# Patient Record
Sex: Female | Born: 1960 | Race: White | Hispanic: No | Smoking: Former smoker
Health system: Southern US, Community
[De-identification: ages and names within clinical notes are randomized; demographics above are authoritative.]

## PROBLEM LIST (undated history)

## (undated) DIAGNOSIS — K219 Gastro-esophageal reflux disease without esophagitis: Secondary | ICD-10-CM

## (undated) DIAGNOSIS — R0600 Dyspnea, unspecified: Secondary | ICD-10-CM

## (undated) DIAGNOSIS — R0601 Orthopnea: Secondary | ICD-10-CM

## (undated) DIAGNOSIS — J449 Chronic obstructive pulmonary disease, unspecified: Secondary | ICD-10-CM

## (undated) DIAGNOSIS — R062 Wheezing: Secondary | ICD-10-CM

## (undated) DIAGNOSIS — J45909 Unspecified asthma, uncomplicated: Secondary | ICD-10-CM

## (undated) DIAGNOSIS — J189 Pneumonia, unspecified organism: Secondary | ICD-10-CM

## (undated) HISTORY — DX: Chronic obstructive pulmonary disease, unspecified: J44.9

## (undated) HISTORY — DX: Pneumonia, unspecified organism: J18.9

## (undated) HISTORY — PX: OTHER SURGICAL HISTORY: SHX169

## (undated) HISTORY — PX: FOOT SURGERY: SHX648

## (undated) HISTORY — PX: EYE SURGERY: SHX253

## (undated) HISTORY — PX: THROAT SURGERY: SHX803

---

## 1987-04-17 HISTORY — PX: ABDOMINAL HYSTERECTOMY: SHX81

## 1987-04-17 HISTORY — PX: VAGINAL HYSTERECTOMY: SUR661

## 2009-03-06 ENCOUNTER — Emergency Department: Payer: Self-pay

## 2009-09-24 ENCOUNTER — Inpatient Hospital Stay: Payer: Self-pay | Admitting: Internal Medicine

## 2010-03-13 ENCOUNTER — Emergency Department: Payer: Self-pay | Admitting: Emergency Medicine

## 2010-05-26 ENCOUNTER — Emergency Department: Payer: Self-pay | Admitting: Emergency Medicine

## 2010-07-13 DIAGNOSIS — J309 Allergic rhinitis, unspecified: Secondary | ICD-10-CM | POA: Insufficient documentation

## 2010-07-13 DIAGNOSIS — Z91018 Allergy to other foods: Secondary | ICD-10-CM | POA: Insufficient documentation

## 2012-03-20 ENCOUNTER — Inpatient Hospital Stay: Payer: Self-pay | Admitting: Family Medicine

## 2012-03-20 LAB — COMPREHENSIVE METABOLIC PANEL
Albumin: 4.1 g/dL (ref 3.4–5.0)
Alkaline Phosphatase: 94 U/L (ref 50–136)
Anion Gap: 6 — ABNORMAL LOW (ref 7–16)
Bilirubin,Total: 0.6 mg/dL (ref 0.2–1.0)
Calcium, Total: 9.5 mg/dL (ref 8.5–10.1)
Co2: 25 mmol/L (ref 21–32)
Creatinine: 0.89 mg/dL (ref 0.60–1.30)
Osmolality: 268 (ref 275–301)
Potassium: 4.5 mmol/L (ref 3.5–5.1)
SGPT (ALT): 24 U/L (ref 12–78)
Sodium: 135 mmol/L — ABNORMAL LOW (ref 136–145)
Total Protein: 7.6 g/dL (ref 6.4–8.2)

## 2012-03-20 LAB — CBC WITH DIFFERENTIAL/PLATELET
Basophil #: 0.1 10*3/uL (ref 0.0–0.1)
Eosinophil #: 1.1 10*3/uL — ABNORMAL HIGH (ref 0.0–0.7)
HGB: 15 g/dL (ref 12.0–16.0)
Lymphocyte #: 3.5 10*3/uL (ref 1.0–3.6)
Lymphocyte %: 30.4 %
MCH: 30 pg (ref 26.0–34.0)
Monocyte %: 6.6 %
Neutrophil #: 6.1 10*3/uL (ref 1.4–6.5)
Neutrophil %: 52.7 %
Platelet: 290 10*3/uL (ref 150–440)
RBC: 5.02 10*6/uL (ref 3.80–5.20)
WBC: 11.5 10*3/uL — ABNORMAL HIGH (ref 3.6–11.0)

## 2012-03-20 LAB — URINALYSIS, COMPLETE
Bilirubin,UR: NEGATIVE
Blood: NEGATIVE
Glucose,UR: NEGATIVE mg/dL (ref 0–75)
Hyaline Cast: 7
Ketone: NEGATIVE
Leukocyte Esterase: NEGATIVE
Ph: 5 (ref 4.5–8.0)
Protein: NEGATIVE
Squamous Epithelial: 1

## 2012-03-20 LAB — TSH: Thyroid Stimulating Horm: 0.482 u[IU]/mL

## 2012-03-21 LAB — CBC WITH DIFFERENTIAL/PLATELET
Basophil %: 0.2 %
Eosinophil #: 0 10*3/uL (ref 0.0–0.7)
Eosinophil %: 0.1 %
HCT: 43.4 % (ref 35.0–47.0)
HGB: 14.9 g/dL (ref 12.0–16.0)
Lymphocyte #: 1.1 10*3/uL (ref 1.0–3.6)
Lymphocyte %: 15.8 %
MCH: 31 pg (ref 26.0–34.0)
MCHC: 34.4 g/dL (ref 32.0–36.0)
Monocyte #: 0.1 x10 3/mm — ABNORMAL LOW (ref 0.2–0.9)
Monocyte %: 1.8 %
Neutrophil #: 5.8 10*3/uL (ref 1.4–6.5)
Neutrophil %: 82.1 %
RBC: 4.81 10*6/uL (ref 3.80–5.20)
WBC: 7.1 10*3/uL (ref 3.6–11.0)

## 2012-03-21 LAB — BASIC METABOLIC PANEL
Anion Gap: 8 (ref 7–16)
Calcium, Total: 9.8 mg/dL (ref 8.5–10.1)
Co2: 26 mmol/L (ref 21–32)
EGFR (African American): >60
Osmolality: 271 (ref 275–301)

## 2012-03-21 LAB — LIPID PANEL: HDL Cholesterol: 60 mg/dL (ref 40–60)

## 2012-03-23 LAB — CBC WITH DIFFERENTIAL/PLATELET
Basophil #: 0 10*3/uL (ref 0.0–0.1)
Basophil %: 0.1 %
Eosinophil %: 0 %
HGB: 13.7 g/dL (ref 12.0–16.0)
MCH: 30.6 pg (ref 26.0–34.0)
MCV: 91 fL (ref 80–100)
Monocyte #: 0.6 x10 3/mm (ref 0.2–0.9)
Monocyte %: 5.9 %
Neutrophil %: 80 %
RBC: 4.48 10*6/uL (ref 3.80–5.20)

## 2012-03-23 LAB — BASIC METABOLIC PANEL
Anion Gap: 4 — ABNORMAL LOW (ref 7–16)
BUN: 18 mg/dL (ref 7–18)
Co2: 29 mmol/L (ref 21–32)
Creatinine: 0.92 mg/dL (ref 0.60–1.30)
EGFR (African American): >60
EGFR (Non-African Amer.): >60
Sodium: 138 mmol/L (ref 136–145)

## 2012-03-23 LAB — MAGNESIUM: Magnesium: 2.2 mg/dL

## 2012-03-25 ENCOUNTER — Ambulatory Visit: Payer: Self-pay | Admitting: Internal Medicine

## 2012-03-31 ENCOUNTER — Inpatient Hospital Stay: Payer: Self-pay | Admitting: Internal Medicine

## 2012-03-31 LAB — CBC WITH DIFFERENTIAL/PLATELET
Basophil #: 0.1 10*3/uL (ref 0.0–0.1)
Basophil %: 0.6 %
Eosinophil #: 0.1 10*3/uL (ref 0.0–0.7)
HCT: 47.6 % — ABNORMAL HIGH (ref 35.0–47.0)
Lymphocyte %: 15.4 %
MCHC: 32.7 g/dL (ref 32.0–36.0)
Monocyte %: 7.1 %
Neutrophil %: 76.1 %
Platelet: 341 10*3/uL (ref 150–440)
RBC: 5.21 10*6/uL — ABNORMAL HIGH (ref 3.80–5.20)
RDW: 14.1 % (ref 11.5–14.5)

## 2012-03-31 LAB — COMPREHENSIVE METABOLIC PANEL
Albumin: 4.4 g/dL (ref 3.4–5.0)
Anion Gap: 5 — ABNORMAL LOW (ref 7–16)
BUN: 11 mg/dL (ref 7–18)
Bilirubin,Total: 0.3 mg/dL (ref 0.2–1.0)
Co2: 31 mmol/L (ref 21–32)
Creatinine: 0.78 mg/dL (ref 0.60–1.30)
EGFR (African American): >60
EGFR (Non-African Amer.): >60
Glucose: 99 mg/dL (ref 65–99)
Osmolality: 275 (ref 275–301)
Potassium: 5.1 mmol/L (ref 3.5–5.1)
SGOT(AST): 25 U/L (ref 15–37)
Sodium: 138 mmol/L (ref 136–145)
Total Protein: 8.5 g/dL — ABNORMAL HIGH (ref 6.4–8.2)

## 2012-04-01 LAB — CBC WITH DIFFERENTIAL/PLATELET
Eosinophil %: 0 %
MCH: 30.4 pg (ref 26.0–34.0)
Monocyte #: 0.4 x10 3/mm (ref 0.2–0.9)
Neutrophil #: 10.3 10*3/uL — ABNORMAL HIGH (ref 1.4–6.5)
Neutrophil %: 86 %
Platelet: 292 10*3/uL (ref 150–440)
RBC: 4.56 10*6/uL (ref 3.80–5.20)
RDW: 13.9 % (ref 11.5–14.5)
WBC: 11.9 10*3/uL — ABNORMAL HIGH (ref 3.6–11.0)

## 2012-04-01 LAB — THEOPHYLLINE LEVEL: Theophylline: <2 ug/mL — ABNORMAL LOW (ref 10.0–20.0)

## 2012-04-01 LAB — BASIC METABOLIC PANEL
Anion Gap: 6 — ABNORMAL LOW (ref 7–16)
Calcium, Total: 10 mg/dL (ref 8.5–10.1)
Chloride: 101 mmol/L (ref 98–107)
Co2: 29 mmol/L (ref 21–32)
EGFR (Non-African Amer.): >60
Glucose: 135 mg/dL — ABNORMAL HIGH (ref 65–99)
Osmolality: 274 (ref 275–301)
Potassium: 5 mmol/L (ref 3.5–5.1)

## 2012-04-05 LAB — CULTURE, BLOOD (SINGLE)

## 2013-03-06 DIAGNOSIS — Z720 Tobacco use: Secondary | ICD-10-CM | POA: Insufficient documentation

## 2013-03-16 ENCOUNTER — Ambulatory Visit: Payer: Self-pay | Admitting: Internal Medicine

## 2013-04-16 HISTORY — PX: OTHER SURGICAL HISTORY: SHX169

## 2013-04-27 ENCOUNTER — Ambulatory Visit: Payer: Self-pay | Admitting: Internal Medicine

## 2013-09-04 ENCOUNTER — Emergency Department: Payer: Self-pay | Admitting: Emergency Medicine

## 2013-09-15 DIAGNOSIS — S335XXA Sprain of ligaments of lumbar spine, initial encounter: Secondary | ICD-10-CM | POA: Insufficient documentation

## 2014-02-04 ENCOUNTER — Ambulatory Visit: Payer: Self-pay | Admitting: Ophthalmology

## 2014-02-25 ENCOUNTER — Observation Stay: Payer: Self-pay | Admitting: Internal Medicine

## 2014-02-25 LAB — BASIC METABOLIC PANEL
ANION GAP: 8 (ref 7–16)
BUN: 9 mg/dL (ref 7–18)
CHLORIDE: 106 mmol/L (ref 98–107)
Calcium, Total: 9.2 mg/dL (ref 8.5–10.1)
Co2: 30 mmol/L (ref 21–32)
Creatinine: 0.9 mg/dL (ref 0.60–1.30)
EGFR (African American): >60
EGFR (Non-African Amer.): >60
Glucose: 104 mg/dL — ABNORMAL HIGH (ref 65–99)
Osmolality: 286 (ref 275–301)
Potassium: 3.8 mmol/L (ref 3.5–5.1)
Sodium: 144 mmol/L (ref 136–145)

## 2014-02-25 LAB — CBC WITH DIFFERENTIAL/PLATELET
Basophil #: 0.1 10*3/uL (ref 0.0–0.1)
Basophil %: 0.8 %
Eosinophil #: 0.6 10*3/uL (ref 0.0–0.7)
Eosinophil %: 6 %
HCT: 43.5 % (ref 35.0–47.0)
HGB: 14.4 g/dL (ref 12.0–16.0)
LYMPHS PCT: 33 %
Lymphocyte #: 3.1 10*3/uL (ref 1.0–3.6)
MCH: 30.7 pg (ref 26.0–34.0)
MCHC: 33.1 g/dL (ref 32.0–36.0)
MCV: 93 fL (ref 80–100)
MONO ABS: 0.5 x10 3/mm (ref 0.2–0.9)
MONOS PCT: 5.3 %
NEUTROS ABS: 5.2 10*3/uL (ref 1.4–6.5)
NEUTROS PCT: 54.9 %
PLATELETS: 257 10*3/uL (ref 150–440)
RBC: 4.69 10*6/uL (ref 3.80–5.20)
RDW: 12.9 % (ref 11.5–14.5)
WBC: 9.4 10*3/uL (ref 3.6–11.0)

## 2014-02-25 LAB — MAGNESIUM: Magnesium: 1.8 mg/dL

## 2014-04-26 DIAGNOSIS — J449 Chronic obstructive pulmonary disease, unspecified: Secondary | ICD-10-CM | POA: Insufficient documentation

## 2014-04-26 DIAGNOSIS — K219 Gastro-esophageal reflux disease without esophagitis: Secondary | ICD-10-CM | POA: Insufficient documentation

## 2014-04-27 DIAGNOSIS — I471 Supraventricular tachycardia: Secondary | ICD-10-CM | POA: Insufficient documentation

## 2014-06-08 ENCOUNTER — Ambulatory Visit: Payer: Self-pay | Admitting: Internal Medicine

## 2014-06-10 ENCOUNTER — Inpatient Hospital Stay: Payer: Self-pay | Admitting: Internal Medicine

## 2014-06-22 ENCOUNTER — Ambulatory Visit: Payer: Self-pay | Admitting: Internal Medicine

## 2014-08-03 NOTE — Discharge Summary (Signed)
PATIENT NAME:  Kristi Walter, [name] MR#:  [id number] DATE OF BIRTH:  Dec 29, 1960  DATE OF ADMISSION:  03/20/2012 DATE OF DISCHARGE:  03/23/2012  REASON FOR ADMISSION/CHIEF COMPLAINT: Severe shortness of breath and wheezing.   DISCHARGE DIAGNOSES:  1. Chronic obstructive pulmonary disease/reactive airway disease exacerbation.  2. Tobacco abuse.  3. Hyponatremia.  4. Hypercholesterolemia.  5. Hypermagnesemia.   DISPOSITION: Home.   MEDICATIONS AT DISCHARGE:  1. Albuterol and ipratropium nebulizers 4 times a day p.r.n. shortness of breath.  2. Spiriva 18 mcg once a day.  3. ProAir HFA 2 puffs every four hours p.r.n.  4. Singular 10 mg once a day.  5. Symbicort 150/4.5 mg 2 puffs twice daily.  6. Theophylline 100 mg once daily.  7. Guaifenesin 600 mg extended-release every 12 hours.  8. Codeine with guaifenesin with pseudoephedrine syrup as needed for cough.  9. Zithromax 500 mg p.o. daily for seven more days.  10. Prednisone taper 50 mg for two days and taper it down 10 mg every two days until gone.   LABORATORY, DIAGNOSTIC AND RADIOLOGICAL DATA: Glucose around 120-130. Sodium 135 on admission, discharge 137. Other electrolytes within normal limits. Normal creatinine. White blood count 9.9 at discharge, 11.5 at admission. Hemoglobin 13.7. Urinalysis within normal limits. pH 7.37, pCO2 45, pO2 184; those are ABGs on admission. EKG normal sinus rhythm on admission. Chest x-ray with no acute cardiopulmonary disease.   HOSPITAL COURSE: Ms. Kristi Walter is a very nice 54 year old female who has history of chronic obstructive pulmonary disease for what she takes Spiriva and Symbicort. Patient is a current smoker but she is trying to quit for what she has been using Chantix. Apparently two days prior to the admission she is starting to have more shortness of breath that was exacerbated by perfumes in her office. She said that she is highly allergic to these strong odors and she gets really sick and has  significant wheezing whenever she smells this but not as much whenever she smokes. Fortunately she decided to quit smoking and so far she has been doing very well. She is admitted with a moderate respiratory distress with 4 liters supplementation nasal cannula. Temperature 99 and heart rate of 107 with respirations of 28. She is in mild respiratory distress, coughing a lot, wheezing a lot. She has been put on steroids IV, azithromycin and Rocephin, pulmonary toilet and nebulizers. She started improving a little bit and by the third day she really wants to go home. She is still on IV steroids but she feels that she can go home, rest there and she does have nebulizer that she can use. We are going to transfer the IV steroids to oral and give her a slow taper for 10 days. Other than that she did okay. She had no other medical problems during this hospitalization.   TIME SPENT: I spent 35 minutes with this discharge.  ____________________________ Felipa Furnaceoberto Sanchez Gutierrez, MD rsg:cms D: 03/23/2012 13:21:08 ET T: 03/23/2012 13:53:01 ET JOB#: [id number]  cc: Felipa Furnaceoberto Sanchez Gutierrez, MD, <Dictator> Coleman Kalas Juanda ChanceSANCHEZ GUTIERRE MD ELECTRONICALLY SIGNED 03/30/2012 14:06

## 2014-08-03 NOTE — H&P (Signed)
PATIENT NAME:  Kristi Walter, Kristi Walter MR#:  [id number] DATE OF BIRTH:  [birth year]  DATE OF ADMISSION:  03/20/2012  PRIMARY CARE PHYSICIAN: Dr. Newman Nickels at [facility]   PULMONOLOGIST: [facility]; the patient does not remember the name at this point of time.  CHIEF COMPLAINT: Severe shortness of breath and cough.   HISTORY OF PRESENT ILLNESS: This is a 54 year old female with past history of COPD and current smoker. She was in her usual state of health before a few days ago but since Monday she started having shortness of breath with dry cough gradually getting worse. She said that she is allergic to perfumes and she had some exposure on Monday and Tuesday to perfumes and that made the problem worse. She was very short of breath this morning and coughing almost constantly and that is why she decided to come to the Emergency Room. On further questioning she denies any fever or sputum production. Denies any sick contacts.   REVIEW OF SYSTEMS: Negative for fever, fatigue, weakness, weight loss or weight gain. EYES: Denies any blurring or double lesion, any redness or discharge from the eyes. ENT: Denies any tinnitus, ear pain, hearing loss, or any discharge. RESPIRATORY: Denies any hemoptysis. She has severe cough and wheezing. CARDIOVASCULAR: Denies any chest pain, orthopnea, edema, palpitations, or dizziness. GASTROINTESTINAL: Denies any nausea, vomiting, diarrhea, abdominal pain, or change in bowel habits. GENITOURINARY: Denies any dysuria, hematuria, frequency, or incontinence. ENDOCRINOLOGY: Denies polyuria, nocturia, heat or cold intolerance. SKIN: Denies any rashes, acne, or change in skin color. MUSCULOSKELETAL: Denies any pain or swelling in the joints. NEUROLOGICAL: Denies numbness, weakness, tremors, headache, or seizures. PSYCHIATRIC: Denies any anxiety, insomnia, or depression.   PAST MEDICAL HISTORY: Positive for chronic obstructive pulmonary disease.   PAST SURGICAL HISTORY: None.   HOME MEDICATIONS:   1. Albuterol ipratropium 3 mL inhaled 4 times a day.  2. ProAir HFA 2 puffs inhaled 4 times a day as needed for shortness of breath.  3. Singulair 10 mg oral tablet once a day. 4. Spiriva 18 mcg inhaled once a day. 5. Symbicort 2 puffs inhaled 2 times a day.   SOCIAL HISTORY: She is a current smoker trying to cut down and using Chantix but she is not fully successful. Does not use alcohol or illegal drugs. Works for [employer].   FAMILY HISTORY: Positive for chronic obstructive pulmonary disease, type II diabetes, ovarian cancer, coronary artery disease, and hypertension.  ALLERGIES: Allergic to perfume. No known drug allergy.   PHYSICAL EXAMINATION:   VITAL SIGNS: Temperature 99, pulse rate 107, respirations 28, blood pressure 145/66, pulse oximetry 96 with 4 liters nasal cannula supplementation.   GENERAL APPEARANCE: She is fully alert, oriented to time, place, and person and in acute distress due to shortness of breath and severe coughing. She is so much coughing that she is unable to finish a sentence and starts coughing continuously.   HEENT: Atraumatic. Conjunctivae pink. Oral mucosa moist. Hearing grossly intact.   NECK: Supple. No JVD. No lymphadenopathy.   RESPIRATORY: Bilateral inspiratory and expiratory wheezing present. Equal air entry.   CARDIOVASCULAR: S1, S2 present, regular. Tachycardia. No murmur.   ABDOMEN: Soft, nontender. Bowel sounds present. No organomegaly appreciated.   LYMPH: No edema on the legs.   SKIN: No rashes.   NEUROLOGICAL: Grossly intact. Follows commands. Power 5 out of 5 in all four limbs. Sensation intact. No tremors. No rigidity.   PSYCHIATRIC: Does not appear in any acute psychiatric illness at this point of time.  JOINTS: No swelling. No tenderness.  LABORATORY, DIAGNOSTIC, AND RADIOLOGICAL DATA: Glucose 100, BUN 6, creatinine 0.89, sodium 135, potassium 4.5, chloride 104, CO2 25, calcium 9.5, total protein 7.6, albumin 4.1, bilirubin 0.6,  alkaline phosphatase 94, SGOT 24, SGPT 24. Troponin less than 0.02. Thyroid stimulating hormone 0.482. CBC WBC 11.5, hemoglobin 15, platelet count 290, MCV 90. ABG pH 7.37, pCO2 45, O2 184, FiO2 40% with mask.   Chest x-ray no acute cardiopulmonary disease.   ASSESSMENT: This is a 54 year old female with past medical history of COPD and allergic to perfume who came in with severe shortness of breath and constant coughing.  1. Acute respiratory failure due to COPD exacerbation and severe coughing. IV steroid, nebulizers, magnesium IV, oxygen supplementation, Pulmonary consult, and Spiriva.  2. Severe coughing. Will give her Robitussin and Cepacol and oxygen. Will also give Rocephin and Zithromax at this point of time and wait for Pulmonary consult.  3. Tobacco abuse. Will give nicotine patch while she's in the hospital. Smoking cessation counseling done for five minutes. 4. DVT and GI prophylaxis.   TOTAL TIME SPENT: 55 minutes.   ____________________________ Hope PigeonVaibhavkumar G. Elisabeth PigeonVachhani, MD vgv:drc D: 03/20/2012 16:05:36 ET T: 03/20/2012 16:58:11 ET JOB#: [id number]  cc: Hope PigeonVaibhavkumar G. Elisabeth PigeonVachhani, MD, <Dictator> [facility] MD ELECTRONICALLY SIGNED 03/31/2012 22:58

## 2014-08-03 NOTE — H&P (Signed)
PATIENT NAME:  Kristi Walter, WEILBACHER MR#:  [id number] DATE OF BIRTH:  February 07, 1961  DATE OF ADMISSION:  03/31/2012  REFERRING PHYSICIAN: Chiquita Loth, MD  PRIMARY CARE PHYSICIAN: Loraine Leriche, MD - [facility]   PULMOLOGISTG: Freda Munro, MD (Recently started seeing him)  CHIEF COMPLAINT: Shortness of breath and cough.   HISTORY OF PRESENT ILLNESS: This is a 54 year old female with past medical history of COPD and tobacco abuse. The patient was recently discharged from [facility] for COPD exacerbation. The patient reports she started having shortness of breath with significant wheezing over the last few days. She states she started to develop cough yesterday and started to have productive sputum, yellow in color. The  patient reports she failed to follow up with Dr. Freda Munro for her COPD. The patient was severely short of breath upon presentation to the ED where she required to be on BiPAP. She received 3 nebulizer treatments with IV Solu-Medrol where she reports her shortness of breath has much improved and currently she is off BiPAP. She denies any fever or chills. Chest x-ray was done and did not show any evidence of infiltrate. The patient reports she is still on tapering dose of prednisone at home and she just recently finished p.o. Zithromax, as an outpatient. Hospitalist service was requested to admit the patient for her COPD exacerbation.   REVIEW OF SYSTEMS: CONSTITUTIONAL: Denies any fever or chills, but has fatigue and weakness. EYES: Denies blurry vision, double vision, glaucoma or pain. ENT: Denies tinnitus, ear pain, hearing loss, epistaxis or discharge. RESPIRATORY: Complains of cough with yellow productive sputum. Has wheezing and shortness of breath. Denies any hemoptysis. CARDIOVASCULAR: Denies chest pain, edema, arrhythmia, palpitations or syncope. GASTROINTESTINAL: Denies nausea, vomiting, diarrhea, abdominal pain, hematemesis or GERD. GENITOURINARY: Denies dysuria, hematuria or  renal colic. ENDOCRINE: Denies polyuria, polydipsia, heat or cold intolerance. HEMATOLOGY: Denies anemia, easy bruising or bleeding diathesis. INTEGUMENTARY: Denies acne, rash or skin lesions. MUSCULOSKELETAL: Denies any neck pain, shoulder pain, knee pain, arthritis, gout or swelling. NEUROLOGIC: Denies, ataxia, vertigo, CVA, TIA or seizures. PSYCH: Denies anxiety, insomnia, schizophrenia, nervousness or bipolar disorder.   PAST MEDICAL HISTORY:  1. History of COPD. 2. Tobacco abuse.   PAST SURGICAL HISTORY: None.   ALLERGIES: EGGS, PEANUTS AND TOMATOES. NO KNOWN DRUG ALLERGIES.  HOME MEDICATIONS: 1. DuoNebs 4 times a day as needed.  2. Spiriva 18 mcg daily.  3. ProAir 2 puffs every four hours as needed.  4. Singulair 10 mg daily.  5. Symbicort 2 puffs twice a day. 6. Theophylline 150 mg daily.  7. Mucinex 600 mg every 12 hours.  8. Zithromax 100 mg p.o. daily.  9. Tapering dose of prednisone.   SOCIAL HISTORY: The patient reports she cut her smoking significantly. Now she is down to 2 cigarettes per day. No alcohol or illicit drug use.   FAMILY HISTORY: Significant for COPD, type 2 diabetes, ovarian cancer and cardiac disease.   PHYSICAL EXAMINATION:  VITAL SIGNS: Temperature 98.7, pulse 103, respiratory rate 20, blood pressure 136/83 and saturating 99% on oxygen.   GENERAL: Well-nourished female who looks comfortable in bed in no apparent distress.   HEENT: Head atraumatic, normocephalic. Pupils equal and reactive to light. Pink conjunctivae. Anicteric sclerae. Moist oral mucosa.   NECK: Supple. No thyromegaly. No JVD.   CHEST: Decreased air entry bilaterally with diffuse wheezing. No rales or rhonchi.   CARDIOVASCULAR: S1 and S2 heard. No rubs, murmurs or gallops. Tachycardic.   ABDOMEN: Soft, nontender and nondistended.  Bowel sounds present.   EXTREMITIES: No edema. No clubbing. No cyanosis.   PSYCHIATRIC: Appropriate affect. Awake and alert x 3. Intact judgment and  insight.   SKIN: Normal skin turgor. Warm and dry. No rashes.   NEUROLOGIC: Grossly intact cranial nerves. Motor 5 out of 5 in all extremities.   JOINTS: No swelling. No tenderness.   PERTINENT LABS: Glucose 99, BUN 11, creatinine 0.78, sodium 138, potassium 5.1, chloride 102 and CO2 31. White blood cells 15, hemoglobin 15.6, hematocrit 47.6 and platelets 341.   Chest x-ray is showing COPD picture, but no opacity or infiltrate.   ASSESSMENT AND PLAN: This is a 54 year old female who presents with shortness of breath, productive sputum with cough, and has COPD exacerbation.  1. COPD exacerbation. The patient required BiPAP initially, currently tolerating oxygen via nasal cannula. We will have her on BiPAP only p.r.n. We will start her on IV Solu-Medrol 80 mg every eight hours. We will have her on DuoNebs every 4 hours and albuterol every 2 hours as needed and we will continue her on her home medications of Spiriva, Singulair, Symbicort and theophylline. We will consult pulmonary service, Dr. Freda MunroSaadat Khan.  2. As well, the patient is having cough with productive sputum so we will start her on IV Levaquin as well.  3. Tobacco abuse. The patient was counseled. She has cut her smoking significantly. We will have her on NicoDerm patch.  4. Leukocytosis. This is most likely reactive and steroid induced and the patient is on home dose tapering his steroids.  5. DVT prophylaxis with subcutaneous heparin.  6. GI prophylaxis with PPI.   CODE STATUS: FULL CODE.   TOTAL TIME SPENT ON ADMISSION AND PATIENT CARE: 55 minutes.  ____________________________ Starleen Armsawood S. Kyanne Rials, MD dse:sb D: 03/31/2012 07:11:22 ET T: 03/31/2012 12:25:07 ET JOB#: [id number]  cc: Starleen Armsawood S. Alejandro Adcox, MD, <Dictator> Dylann Gallier Teena IraniS Athen Riel MD ELECTRONICALLY SIGNED 04/09/2012 12:21

## 2014-08-06 NOTE — Discharge Summary (Signed)
PATIENT NAME:  Kristi Walter, Kristi Walter MR#:  [id number] DATE OF BIRTH:  [birth year]  DATE OF ADMISSION:  03/31/2012 DATE OF DISCHARGE:  04/03/2012  PRIMARY CARE PHYSICIAN: non local  DISCHARGE DIAGNOSIS: Chronic obstructive pulmonary disease exacerbation.   PRESENTING COMPLAINT: Shortness of breath and cough.   HISTORY OF PRESENT ILLNESS: This is a 54 year old female with past medical history of COPD and tobacco abuse who [facility]ation. The patient reports she started having shortness of breath with significant wheezing for the last few days and she stated that she developed cough and on the previous day started with  productive sputum, yellowish in color. She failed to follow up with Dr. Freda MunroSaadat Khan for her COPD.  She was severely short of  breath and upon presentation to the ED she required to be on BiPAP and three nebulizer treatments with IV Solu-Medrol where she reports her shortness of breath had improved and was off BiPAP while being seen by the hospitalist for the first time. Chest x-ray on ER presentation did not show any evidence of infiltrate.   HOSPITAL COURSE: For COPD exacerbation, she required BiPAP initially but then was tapered off to nasal cannula with IV Solu-Medrol. She was having productive cough with sputum so she was started on Levaquin IV and for tobacco abuse she was started on a liquid patch. Pulmonary consult was done as she failed outpatient followup with Dr. Freda MunroSaadat Khan and Dr. Meredeth IdeFleming saw her in the hospital and he advised to continue theophylline and advised her to stop smoking. Slowly, after a few days, she started feeling better and so finally decided to send her home.   IMPORTANT LAB RESULTS DURING HOSPITAL STAY: Microbiology: Blood culture remains negative after five days.  CBC on presentation: Total WBC 15, hemoglobin 15.6, platelet count 341 and MCV 92.  Glucose 99, creatinine 0.78, potassium 5.1, CO2 31  and albumin 4.4. Troponin less than 0.02. Theophylline was level less than 2.0.   CODE STATUS ON DISCHARGE: FULL CODE.  CONDITION ON DISCHARGE: Stable.   MEDICATIONS ADVISED ON DISCHARGE: 1. Albuterol ipratropium inhaler 4 times a day.  2. ProAir HFA aerosol 4 times a day as needed for shortness of breath.  3. Singulair 10 mg oral tablet once a day.  4. Theophylline 100 mg/24-hour oral capsule extended release once a day.  5. Loratadine 10 mg oral tablet once a day.  6. Guaifenesin 600 mg oral extended-release every 12 hours.  7. Budesonide formoterol 1 puff inhaled 2 times a day.  8. Spiriva 18 mcg inhalation capsule once a day. 9. Prednisone 10 mg tablets with prescribed to start at 60 mg and taper by 10 mg daily until complete. 10. Benzonatate 100 mg oral capsule every 6 hours for 4 days as needed for cough. 11. Levaquin 500 mg oral tablet 1 tablet once a day for 4 more days. 12. Nicotine patch.   HOME HEALTH: No.   HOME OXYGEN: No.   DIET ON DISCHARGE: Regular. Consistency regular.   TIMEFRAME TO FOLLOWUP: Follow up within 1 to 2 weeks.   TOTAL TIME SPENT ON DISCHARGE: 45 minutes.  ____________________________ Hope PigeonVaibhavkumar G. Elisabeth PigeonVachhani, MD vgv:sb D: 04/09/2012 23:40:00 ET     T: 04/10/2012 07:35:37 ET        JOB#: [id number] cc: Hope PigeonVaibhavkumar G. Elisabeth PigeonVachhani, MD, <Dictator> Altamese DillingVAIBHAVKUMAR Xoey Warmoth MD ELECTRONICALLY SIGNED 04/28/2012 23:29

## 2014-08-07 NOTE — H&P (Signed)
PATIENT NAME:  Kristi Walter, Kristi Walter MR#:  [id number] DATE OF BIRTH:  02-12-1961  DATE OF ADMISSION:  02/25/2014  PRIMARY CARE PHYSICIAN:  Lyndon CodeFozia Walter Khan, MD   REFERRING PHYSICIAN:  Lyndon CodeFozia Walter Khan, MD, this is a direct admission.     CHIEF COMPLAINT: Shortness of breath, wheezing and cough for 5 days.   HISTORY OF PRESENT ILLNESS: A 54 year old Caucasian female with a history of COPD, tobacco abuse was sent from primary care physician's office due to shortness of breath, cough, wheezing for 5 days. The patient is alert, awake, oriented, in no acute distress. The patient has a history of COPD; last admission for COPD exacerbation was 2 years ago. The patient denies any fever or chills. Denies any ill contacts but the patient did developed shortness of breath and cough 5 days ago. In addition, the patient has worsening symptoms with wheezing and severe cough with greenish sputum, so patient went to PCP's office for evaluation. Dr. Beverely RisenFozia Khan and pulmonary, Dr. Welton FlakesKhan, saw the patient and suggested patient needs direct admission to the hospital. So the patient was sent here. The patient denies any chest pain, but the chest tightness; no orthopnea, nocturnal dyspnea, no leg edema. The patient denies any other symptoms.   PAST MEDICAL HISTORY: Chronic obstructive pulmonary disease, tobacco abuse.   SOCIAL HISTORY: Smokes 1 to 2 cigarettes a day. The patient was a smoker before but reduced, chronic obstructive pulmonary disease, tobacco abuse.   SOCIAL HISTORY: The patient recalls she cut her smoking to 1 to 2 cigarettes per day, no alcohol drinking or illicit drugs.   FAMILY HISTORY: Chronic obstructive pulmonary disease, diabetes, ovarian cancer and cardiac disease.   ALLERGIES: Eggs, PEANUTS AND TOMATO.   HOME MEDICATIONS: Theophylline  100 mg p.o. every 8 hours; Symbicort 160/4.5 mcg inhalation 2 puffs b.i.d., Spiriva 18 mcg inhalation once a day, Singulair 10 mg p.o. daily, ProAir HFA 90 mcg inhalation 2 puffs  4 times a day p.r.n. for shortness of breath, omeprazole 20 mg p.o. daily, loratadine 10 mg p.o. daily, Flonase 50 mcg inhalation 1 spray nasal once a day, Chantix 1 mg p.o. tablet 1 tablet every other day; Duo Nebs 2.5 mg/0.5 mg per 3 mL inhaled 4 times a day.   REVIEW OF SYSTEMS:   CONSTITUTIONAL: The patient denies any fever or chills. No headache or dizziness. No weakness.  EYES: No double vision or blurred vision.  ENT: No postnasal drip, slurred speech or dysphagia.  CARDIOVASCULAR: No chest pain, palpitation, orthopnea, nocturnal dyspnea, no leg edema.  PULMONARY: Positive for cough, sputum, shortness of breath and wheezing; no hemoptysis.  GASTROINTESTINAL: No abdominal pain, nausea, vomiting, diarrhea, no melena, bloody stool.  GENITOURINARY: No dysuria, hematuria, or incontinence.  SKIN: No rash or jaundice.  NEUROLOGIC: No syncope, loss of consciousness, or seizure.  ENDOCRINOLOGIC: No polyuria, polydipsia, heat or cold intolerance.  SKIN: No rash or jaundice.  ENDOCRINE: No polyuria, polydipsia, heat or cold intolerance.  HEMATOLOGIC: No easy bruising or bleeding.   PHYSICAL EXAMINATION: VITAL SIGNS: Temperature 97.9, blood pressure 125/83, pulse 88, O2 saturation 96% on room air.  GENERAL: The patient is alert, awake, oriented, in no acute distress.  HEENT: Pupils round, equal and reactive to light and accommodation, moist oral mucosa, clear pharynx. NECK: Supple, no JVD or carotid bruits, no lymphadenopathy,  no thyromegaly.  CARDIOVASCULAR: S1, S2 regular rate, rhythm, no murmurs, gallops.  PULMONARY: Bilateral air entry, severe expiratory wheezing and rhonchi, no crackles, with mild use of accessory muscle  to breathe and severe coughing.  ABDOMEN: Soft. No distention or tenderness. No organomegaly. Bowel sounds present.  EXTREMITIES: No edema, clubbing or cyanosis. No calf tenderness. Bilateral pedal pulses present.  SKIN: No rash or jaundice.  NEUROLOGIC: AO x 3, no focal  deficit, power 5/5, sensation intact.   DIAGNOSTIC DATA: No laboratory data now, I ordered a CBC, BMP, chest x-ray and magnesium level; EKG just now showed normal sinus rhythm at 81 BPM.   IMPRESSIONS: 1.  Chronic obstructive pulmonary exacerbation.  2.  Tobacco abuse.  3.  Obesity.   PLAN OF TREATMENT: 1.  The patient will be treated with Solu-Medrol, DuoNeb, Symbicort, Spiriva; continue patient's home medication and start oxygen by nasal cannula 2 liters, titrate depending on patient's O2 of saturation. We will add Mucinex p.o. q. 12 hours.  2.  For tobacco abuse, smoking cessation was counseled for 3 to 4 minutes. We will continue Chantix.   I discussed the patient's condition and plan of treatment with the patient and the patient's husband, the patient wants full code.   TIME SPENT: About 52 minutes.    ____________________________ Shaune Pollack, MD qc:nt D: 02/25/2014 14:56:22 ET T: 02/25/2014 15:18:39 ET JOB#: [id number]  cc: Shaune Pollack, MD, <Dictator> Shaune Pollack MD ELECTRONICALLY SIGNED 02/26/2014 16:28

## 2014-08-07 NOTE — Discharge Summary (Signed)
PATIENT NAME:  Kristi Walter, Kristi Walter MR#:  [id number] DATE OF BIRTH:  Oct 04, 1960  DATE OF ADMISSION:  02/25/2014  DATE OF DISCHARGE:  02/27/2014  ADMITTING DIAGNOSIS: Chronic obstructive pulmonary disease exacerbation.  DISCHARGE DIAGNOSES:  1. Acute on chronic respiratory failure with hypoxia.  2. Chronic obstructive pulmonary disease exacerbation.  3. Acute on chronic bronchitis.  4. Cough.   DISCHARGE CONDITION: Stable.   DISCHARGE MEDICATIONS: The patient is to resume DuoNebs 3 mL 4 times daily as needed; ProAir HFA 2 puffs 4 times daily as needed; Spiriva 1 inhalation daily; Symbicort 160/4.5 two puffs twice daily; Flonase 1 spray once daily; Singulair 10 mg daily; omeprazole 10 mg p.o. daily; theophylline extended release 100 mg every 8 hours; loratadine 10 mg daily; Chantix 1 mg tablet every second day; Epi EZ Pen 0.3 mg injectable once daily as needed; prednisone taper 60 mg p.o. once on November 15 and then taper x 10 mg every 2 days until stopped; benzonatate 100 mg every 6 hours as needed; guaifenesin 600 mg twice daily; levofloxacin 750 mg once daily for 5 more days; Tussionex 5 mL twice daily as needed.   HOME OXYGEN: None.   DIET: Regular, regular consistency.   ACTIVITY LIMITATIONS: As tolerated.    FOLLOWUP APPOINTMENT: With Dr. Freda Munro pulmonary in 2 days after discharge, PCP from [facility] in 2 days after discharge. Patient was advised to stop smoking as well and she was consulted about smoking cessation.   CONSULTANTS: Care management, social worker.   RADIOLOGIC STUDIES: Chest x-ray, PA and lateral, 02/25/2014, showing mild hyperinflation. No segmental infiltrate, mild interstitial prominence bilaterally without pulmonary edema was also noted.   HISTORY OF PRESENT ILLNESS: The patient is 54 year old Caucasian female with history of tobacco abuse, COPD, who presents to the hospital with complaints of shortness of breath, wheezing, and cough for the past 5 days.  Please refer to Dr. Nicky Pugh admission note on 02/25/2014.   HOSPITAL COURSE: On arrival to the hospital, patient's vital signs: Temperature was 97.9, pulse was 88, respiration rate was 16-18, blood pressure 125/83, saturation was 96% on room air Physical exam revealed a well-developed, well-nourished Caucasian female in mild respiratory distress with severe expiratory wheezing as well as rhonchi and mild use of accessory muscles and severe coughing. The patient's lab data on arrival to the Emergency Room revealed an elevated glucose level of 104, otherwise BMP was unremarkable. Magnesium level was normal at 1.8. CBC: White blood cell count was 9.4, hemoglobin was 14.4, platelet count was 257,000; absolute neutrophil count was normal at 5.2. The patient's chest x-ray showed no pneumonia. The patient was admitted to the hospital for further evaluation. She was started on steroids, antibiotics, inhalation therapy, as well as nebulizers. With this, her condition improved; however, she continues to have some cough as well as wheezes. She was advised to change her antibiotics from Rocephin to Zithromax, which she was initiated on in the hospital to levofloxacin to cover pseudomonas and this medication in this patient with chronic respiratory failure and long-standing COPD with numerous antibiotic use in the past. The patient is to continue antibiotic therapy. She was advised to cough up for sputum cultures and, if able, we will be able to assess sputum culture specimen in the next few days after discharge. She is to follow up with her primary care physician as well as pulmonologist, Dr. Freda Munro, in the next few days after discharge. She is to continue antibiotic therapy, steroid taper, as well as inhalation  therapy and  stop smoking. She was weaned off oxygen therapy completely and her oxygen saturations were satisfactory. On the day of discharge, the patient's oxygen saturations were 92%-95% on room air at rest as  well as on exertion, signifying no need for oxygen therapy at home at this time, although nocturnal oximetry study could be performed for  patient if she is uncomfortable at night. In regard to acute bronchitis, she is to continue antibiotic therapy for the next 5 days to complete the course. For cough she is to continue Tussionex as well as Humibid and benzonatate pearls. On the day of discharge, the patient felt satisfactory, did not complain of any significant discomfort.   She is being discharged in stable condition with the above-mentioned medications and follow-up. On the day of discharge, temperature is 97.6, pulse was 78, respiration rate was from 18-22, blood pressure 116/53, and saturation was 92%-95% on room air at rest as well as on exertion.   TIME SPENT: 40 minutes.    ____________________________ Katharina Caperima Timothee Gali, MD rv:bm D: 02/27/2014 17:36:00 ET T: 02/27/2014 20:55:48 ET JOB#: [id number]  cc: Yevonne PaxSaadat A. Khan, MD Unknown CC Katharina Caperima Naif Alabi, MD, <Dictator>      Tyara Dassow MD ELECTRONICALLY SIGNED 03/14/2014 20:27

## 2014-08-15 NOTE — H&P (Signed)
PATIENT NAME:  Kristi Walter, Kristi Walter MR#:  [id number] DATE OF BIRTH:  August 09, 1960  DATE OF ADMISSION:  06/10/2014  PRIMARY CARE PHYSICIAN: Newman [facility]elshomas [facility]ce[facility]   REASON FOR ADMISSION: The patient was admitted directly from Dr. Sampson GoonSaadat Khan's office in regards to respiratory failure.   HISTORY OF PRESENT ILLNESS:  The patient is a 54 year old Caucasian female with a known history significant for history of COPD, tobacco abuse, ongoing, who presents to the hospital with complaints of a 7-day history of cough and yellow phlegm production. According to the patient, she was doing well up until approximately a week ago, when she started having increasing cough and phlegm production. Cough is mostly in the morning, and she coughs up yellow to greenish-looking phlegm. It is thick and sometimes it rattles in the chest. She has been wheezing and very short of breath. Her oxygen level was checked in Dr. Milta DeitersKhan's office and was found to be 86% on room air. She was started on 2 liters of oxygen through nasal cannula, and the patient was sent to the hospital as direct admit. She admits to have fever and chills and having some low-grade fevers at nighttime. She was apparently started on Levaquin  just a few days ago for pneumonia, which was noted on chest x-ray done on 06/08/2014. Chest x-ray report is as follows: The patient was noted to have focal right middle lobe infiltrate.   PAST MEDICAL HISTORY: Significant for history of admission of this patient in November 2015 for acute bronchitis, acute on chronic respiratory failure. At that time, she required oxygen; however, was weaned off oxygen completely. COPD, history of tobacco abuse, ongoing.   MEDICATIONS: Per medical records, the patient is on albuterol/ipratropium 2.5/0.5 mg 3 mL inhalation solution 4 times daily as needed. Chantix 1 mg every 2nd day, Tussionex 5 mL twice daily as needed, EpiPen 0.3 mg injectable solution once daily as needed, Flonase 1 spray once  daily, levofloxacin 750 mg p.o. daily, loratadine 10 mg p.o. daily, omeprazole 10 mg p.o. daily, prednisone taper, ProAir HFA 2 puffs 4 times daily as needed. Singulair 10 mg p.o. daily, Spiriva 18 mcg inhalation daily; Symbicort 160/4.5 two puffs twice daily, theophylline extended release 100 mg p.o. every 8 hours.   PAST SURGICAL HISTORY: None.   FAMILY HISTORY: Chronic obstructive pulmonary disease, diabetes, ovarian cancer, as well as cardiac disease.   SOCIAL HISTORY: Smokes 1-2 cigarettes a day, has been smoking since her young adulthood.   REVIEW OF SYSTEMS: Positive for fatigue and weakness, shortness of breath, weight gain, cough, greenish-yellowish phlegm production. Admits to feeling chilly, as well as low-grade fevers at nighttime. Denies any weight loss, gain. EYES: Denies any blurry vision, double vision, glaucoma. Admits of having reading glasses. Admits of having some cataract in the right eye, which she had operated in August 2015.  EARS, NOSE, AND THROAT: Denies any tinnitus,allergies,  epistaxis, sinus pain, dentures, difficulty swallowing.   RESPIRATORY:  admits of cough, phlegm, shortness of breath, denies any hemoptysis.  CARDIOVASCULAR: Denies chest pains, edema,  arrhythmias, palpitations or syncope.  GASTROINTESTINAL:  Denies any nausea, vomiting, diarrhea, or constipation.  GENITOURINARY: Denies dysuria, hematuria, frequency, incontinence. ENDOCRINE: Denies any polydipsia, nocturia, thyroid problems, heat or cold intolerance or thirst.  HEMATOLOGIC: Denies any anemia, easy bruising, bleeding, swollen glands.   SKIN: Denies any acne, rashes, or change in moles.  MUSCULOSKELETAL: Denies arthritis, cramps, swelling.  NEUROLOGIC: Denies numbness, epilepsy or tremor.  PSYCHIATRIC: Denies anxiety, insomnia, or depression.  PHYSICAL EXAMINATION: VITAL SIGNS: On arrival to the hospital: Temperature is 98.2, pulse was 80, respiratory rate 20, blood pressure 152/82, saturation  98% on 2 liters of oxygen through nasal cannula. It was 86% on room air in Dr. Sampson Goon office. GENERAL: This is a well-developed, well-nourished Caucasian female in no acute distress, sitting on the stretcher. She is pale and has a grayish complexion.  HEENT: Pupils are equal, reactive to  light. Extraocular muscles intact. There is no icterus or conjunctivitis. Has normal hearing. No pharyngeal erythema. Mucosa is moist.  NECK: No masses. Supple, nontender. Thyroid is not enlarged. No adenopathy. No JVD or carotid bruits bilaterally. Full range of motion.  LUNGS: Diminished breath bilaterally, rhonchi as well as wheezing was heard. The patient does have labored inspirations, as well as increased effort to breathe. She is in mild to moderate respiratory distress.  CARDIOVASCULAR: S1, S2 appreciated. Rhythm is regular. PMI lateralized. Chest is nontender to palpation. 1+ pedal pulses. No lower extremity edema, calf tenderness, or cyanosis was noted.  ABDOMEN: Soft, nontender. Bowel sounds are present. No hepatosplenomegaly or masses were noted.  RECTAL: Deferred.  MUSCLE STRENGTH: Able to move all extremities. No cyanosis, degenerative joint disease or kyphosis. Gait was not tested.  SKIN: Did not reveal any rashes, lesions, erythema, nodularity, or induration. It was warm and dry to palpation.  LYMPHATIC: No adenopathy in the cervical region.  NEUROLOGICAL: Cranial nerves grossly intact. Sensory is intact. No dysarthria or aphasia. The patient is alert, oriented to time, person, and place, cooperative. Memory is good.  PSYCHIATRIC: No sign contusion, agitation, or depression noted.   IMAGING STUDIES: Chest x-ray done on 06/08/2014, revealed a focal of right middle lobe infiltrate with volume loss, which was better seen on lateral views. Lungs, elsewhere, were clear.   LABORATORY DATA: Glucose level of 106; otherwise BMP normal. Liver enzymes were normal. Cardiac enzymes first set, 0.02. White  blood cell count is normal at 10.4, hemoglobin 13.1, platelet count 329. Absolute neutrophil count is 9.4. EKG is not done.   ASSESSMENT AND PLAN: 1. Acute respiratory failure. Admit the patient to medical floor. Continue on steroids, inhalation therapy as well as nebulizers, and oxygen therapy, weaning her oxygen, depending on her saturations.  2. Pneumonia of right middle lobe, due to unclear etiologic agent at this time. Initiate the patient on Rocephin and Zithromax and get sputum cultures. Adjust antibiotics according to culture results.  3. Chronic obstructive pulmonary disease exacerbation due to pneumonia. As above, we will continue steroids inhalation therapy, as well as nebulizer.  4. Hypoglycemia checked check hemoglobin A1c to rule out diabetes mellitus.  5. Tobacco abuse. Discussed cessation for 4 minutes. Nicotine replacement therapy will be initiated, for which she is agreeable. Will continue to use Chantix, as well    ____________________________ Katharina Caper, MD rv:mw D: 06/10/2014 15:17:52 ET T: 06/10/2014 16:32:50 ET JOB#: [id number]  cc: Katharina Caper, MD, <Dictator> Kirtan Sada MD ELECTRONICALLY SIGNED 06/30/2014 14:24

## 2014-08-15 NOTE — Discharge Summary (Signed)
PATIENT NAME:  Kristi Walter, Kristi Walter MR#:  [id number] DATE OF BIRTH:  [birth year]  DATE OF ADMISSION:  06/10/2014 DATE OF DISCHARGE:  06/11/2014  PRESENTING COMPLAINT: Shortness of breath, coughing, and hypoxemia.  DISCHARGE DIAGNOSES:  1.  Acute on chronic hypoxic respiratory failure due to chronic obstructive pulmonary disease exacerbation.  2.  Right middle lobe early pneumonia.  3.  Ongoing tobacco abuse.   CONDITION ON DISCHARGE: Fair, saturations 90%-91% on room air with exertion.   MEDICATIONS:  1.  The patient is to complete her Levaquin and prednisone taper as given by Dr. Welton FlakesKhan as outpatient.  2.  Continue albuterol ipratropium DuoNebs 3 mL 4 times a day.  3.  ProAir HFA 90 mcg/inhalation 2 puffs 4 times a day as needed.  4.  Spiriva 18 mcg inhalation 1 daily.  5.  Symbicort 160/4.5 two puffs b.i.d.  6.  Flonase 1 spray nasally once a day.  7.  Singulair 10 mg p.o. daily.  8.  Omeprazole 20 mg daily.  9.  Theophylline 100 mg extended release p.o. every 8 hourly.  10.  Loratadine 10 mg daily.  11.  Chantix 1 tablet every other day.  12.  EpiPen as needed.  13.  Tussionex 5 mL b.i.d. as needed.   DIET: Regular.   FOLLOWUP: With Dr. Welton FlakesKhan on your scheduled appointment.   LABORATORY DATA: CBC and basic metabolic panel within normal limits. Cardiac enzymes negative.   BRIEF SUMMARY OF HOSPITAL COURSE: Ms. Kristi Walter is a 54 year old Caucasian female with known history of COPD and ongoing tobacco abuse. Came in with:  1.  Acute on chronic respiratory failure due to COPD exacerbation: She received IV steroids. Will finish up her p.o. steroid taper, continue her nebulizers, inhalers, and finish up her antibiotic with Levaquin that was already given to her as outpatient with Dr. Welton FlakesKhan. Her saturations were 90% on exertion, 91% on 1 L; she does not qualify for home oxygen.  2.  Pneumonia right middle lobe, early: Complete course of Levaquin.  3.  COPD due to pneumonia: As above.  4.  Tobacco  abuse: Advised smoking cessation. The patient agreeable.   Discharge plan discussed with the patient, the patient's family, and Dr. Welton FlakesKhan.   TIME SPENT: 40 minutes.    ____________________________ Wylie HailSona A. Allena KatzPatel, MD sap:bm D: 06/11/2014 15:11:01 ET T: 06/12/2014 01:10:57 ET JOB#: [id number]  cc: Kyriaki Moder A. Allena KatzPatel, MD, <Dictator> Willow OraSONA A Ravenna Legore MD ELECTRONICALLY SIGNED 06/15/2014 17:37

## 2014-12-27 ENCOUNTER — Ambulatory Visit
Admission: RE | Admit: 2014-12-27 | Discharge: 2014-12-27 | Disposition: A | Discharge status: Home / Self Care | Payer: 59 | Source: Ambulatory Visit | Attending: Internal Medicine | Admitting: Internal Medicine

## 2014-12-27 ENCOUNTER — Other Ambulatory Visit: Payer: Self-pay | Admitting: Internal Medicine

## 2014-12-27 DIAGNOSIS — R05 Cough: Secondary | ICD-10-CM

## 2014-12-27 DIAGNOSIS — R059 Cough, unspecified: Secondary | ICD-10-CM

## 2015-05-16 ENCOUNTER — Ambulatory Visit
Admission: RE | Admit: 2015-05-16 | Discharge: 2015-05-16 | Disposition: A | Discharge status: Home / Self Care | Payer: 59 | Source: Ambulatory Visit | Attending: Internal Medicine | Admitting: Internal Medicine

## 2015-05-16 ENCOUNTER — Other Ambulatory Visit: Payer: Self-pay | Admitting: Internal Medicine

## 2015-05-16 DIAGNOSIS — J189 Pneumonia, unspecified organism: Secondary | ICD-10-CM | POA: Insufficient documentation

## 2015-05-16 DIAGNOSIS — R05 Cough: Secondary | ICD-10-CM | POA: Diagnosis present

## 2015-05-16 DIAGNOSIS — R059 Cough, unspecified: Secondary | ICD-10-CM

## 2015-05-24 ENCOUNTER — Ambulatory Visit: Payer: 59

## 2015-06-08 ENCOUNTER — Encounter: Payer: Self-pay | Admitting: *Deleted

## 2015-06-08 ENCOUNTER — Encounter: Payer: 59 | Attending: Internal Medicine | Admitting: *Deleted

## 2015-06-08 DIAGNOSIS — F17200 Nicotine dependence, unspecified, uncomplicated: Secondary | ICD-10-CM | POA: Insufficient documentation

## 2015-06-08 DIAGNOSIS — J449 Chronic obstructive pulmonary disease, unspecified: Secondary | ICD-10-CM | POA: Insufficient documentation

## 2015-06-09 NOTE — Patient Instructions (Signed)
Patient Instructions  Patient Details  Name: Kristi Walter MRN: [id number] Date of Birth: 1960/06/19 Referring Provider:  Yevonne Pax, MD  Below are the personal goals you chose as well as exercise and nutrition goals. Our goal is to help you keep on track towards obtaining and maintaining your goals. We will be discussing your progress on these goals with you throughout the program.  Initial Exercise Prescription:     Initial Exercise Prescription - 06/08/15 1300    Date of Initial Exercise Prescription   Date 06/08/15   Treadmill   MPH 2   Grade 0   Minutes 5  Try 2 sets, resting between sets as needed   Bike   Level 0.4   Watts 15   Minutes 10   Recumbant Bike   Level 1.5   Watts 15   Minutes 10  May break time up into increments   NuStep   Level 2   Watts 35   Minutes 10   Recumbant Elliptical   Level 1   RPM 40   Minutes 10   Elliptical   Level 1   Speed 2.5   Minutes 1  Begin in one minute increments, increasing time as tolerated   REL-XR   Level 2   Watts 30   Minutes 10  May use increments to achieve 10 minute total   T5 Nustep   Level 2   Watts 20   Minutes 10  Can utilize intervals to complete 10 minutes   Biostep-RELP   Level 2   Watts 30   Minutes 5  Begin with 5 minutes and increase as able   Prescription Details   Frequency (times per week) 3   Duration Progress to 30 minutes of continuous aerobic without signs/symptoms of physical distress   Intensity   THRR REST +  30   Ratings of Perceived Exertion 11-15   Perceived Dyspnea 2-4   Progression Continue progressive overload as per policy without signs/symptoms or physical distress.   Resistance Training   Training Prescription Yes   Weight 3   Reps 10-12      Exercise Goals: Frequency: Be able to perform aerobic exercise three times per week working toward 3-5 days per week.  Intensity: Work with a perceived exertion of 11 (fairly light) - 15 (hard) as tolerated. Follow your  new exercise prescription and watch for changes in prescription as you progress with the program. Changes will be reviewed with you when they are made.  Duration: You should be able to do 30 minutes of continuous aerobic exercise in addition to a 5 minute warm-up and a 5 minute cool-down routine.  Nutrition Goals: Your personal nutrition goals will be established when you do your nutrition analysis with the dietician.  The following are nutrition guidelines to follow: Cholesterol < /day Sodium < /day Fiber: Women over 50 yrs - 21 grams per day  Personal Goals:   Tobacco Use Initial Evaluation: History  Smoking status  . Current Every Day Smoker -- 0.25 packs/day for 30 years  . Types: Cigarettes  Smokeless tobacco  . Never Used    Copy of goals given to participant.

## 2015-06-09 NOTE — Progress Notes (Signed)
Pulmonary Individual Treatment Plan  Patient Details  Name: QUETZALLY CALLAS MRN: [id number] Date of Birth: Nov 14, 1960 Referring Provider:  Yevonne Pax, MD  Initial Encounter Date: Date: 06/08/15  Visit Diagnosis: Chronic obstructive pulmonary disease, unspecified COPD type (HCC)  Patient's Home Medications on Admission:  Current outpatient prescriptions:  .  albuterol (PROAIR HFA) 108 (90 Base) MCG/ACT inhaler, , Disp: , Rfl:  .  benzonatate (TESSALON) 100 MG capsule, Take 100 mg by mouth 3 (three) times daily as needed for cough., Disp: , Rfl:  .  budesonide-formoterol (SYMBICORT) 160-4.5 MCG/ACT inhaler, , Disp: , Rfl:  .  EPINEPHrine 0.3 mg/0.3 mL IJ SOAJ injection, Inject into the muscle., Disp: , Rfl:  .  fluticasone (FLONASE) 50 MCG/ACT nasal spray, , Disp: , Rfl:  .  ipratropium-albuterol (DUONEB) 0.5-2.5 (3) MG/3ML SOLN, , Disp: , Rfl:  .  omeprazole (PRILOSEC) 20 MG capsule, Take by mouth., Disp: , Rfl:  .  theophylline (THEO-24) 100 MG 24 hr capsule, Take by mouth., Disp: , Rfl:  .  tiotropium (SPIRIVA) 18 MCG inhalation capsule, , Disp: , Rfl:   Past Medical History: History reviewed. No pertinent past medical history.  Tobacco Use: History  Smoking status  . Current Every Day Smoker -- 0.25 packs/day for 30 years  . Types: Cigarettes  Smokeless tobacco  . Never Used    Labs: Recent Review Flowsheet Data    Labs for ITP Cardiac and Pulmonary Rehab Latest Ref Rng 03/21/2012   Cholestrol 0-200 mg/dL 086(V)   LDLCALC 7-846 mg/dL 962(X)   HDL 52-84 mg/dL 60   Trlycerides 1-324 mg/dL 95       ADL UCSD:     ADL UCSD      06/08/15 1701       ADL UCSD   ADL Phase Entry     SOB Score total 67     Rest 0     Walk 2     Stairs 4     Bath 3     Dress 3     Shop 4         Pulmonary Function Assessment:   Exercise Target Goals: Date: 06/08/15  Exercise Program Goal: Individual exercise prescription set with THRR, safety & activity barriers.  Participant demonstrates ability to understand and report RPE using BORG scale, to self-measure pulse accurately, and to acknowledge the importance of the exercise prescription.  Exercise Prescription Goal: Starting with aerobic activity 30 plus minutes a day, 3 days per week for initial exercise prescription. Provide home exercise prescription and guidelines that participant acknowledges understanding prior to discharge.  Activity Barriers & Risk Stratification:   6 Minute Walk:     6 Minute Walk      06/08/15 1301       6 Minute Walk   Phase Initial     Distance 1165 feet     Walk Time 6 minutes     # of Rest Breaks 2     MPH 2.2     RPE 19     Perceived Dyspnea  9     Symptoms No     Resting HR 82 bpm     Resting BP 148/72 mmHg     Max Ex. HR 112 bpm     Max Ex. BP 154/70 mmHg        Initial Exercise Prescription:     Initial Exercise Prescription - 06/08/15 1300    Date of Initial Exercise Prescription   Date 06/08/15  Treadmill   MPH 2   Grade 0   Minutes 5  Try 2 sets, resting between sets as needed   Bike   Level 0.4   Watts 15   Minutes 10   Recumbant Bike   Level 1.5   Watts 15   Minutes 10  May break time up into increments   NuStep   Level 2   Watts 35   Minutes 10   Recumbant Elliptical   Level 1   RPM 40   Minutes 10   Elliptical   Level 1   Speed 2.5   Minutes 1  Begin in one minute increments, increasing time as tolerated   REL-XR   Level 2   Watts 30   Minutes 10  May use increments to achieve 10 minute total   T5 Nustep   Level 2   Watts 20   Minutes 10  Can utilize intervals to complete 10 minutes   Biostep-RELP   Level 2   Watts 30   Minutes 5  Begin with 5 minutes and increase as able   Prescription Details   Frequency (times per week) 3   Duration Progress to 30 minutes of continuous aerobic without signs/symptoms of physical distress   Intensity   THRR REST +  30   Ratings of Perceived Exertion 11-15    Perceived Dyspnea 2-4   Progression Continue progressive overload as per policy without signs/symptoms or physical distress.   Resistance Training   Training Prescription Yes   Weight 3   Reps 10-12      Exercise Prescription Changes:   Discharge Exercise Prescription (Final Exercise Prescription Changes):    Nutrition:  Target Goals: Understanding of nutrition guidelines, daily intake of sodium 1500mg , cholesterol 200mg , calories 30% from fat and 7% or less from saturated fats, daily to have 5 or more servings of fruits and vegetables.  Biometrics:    Nutrition Therapy Plan and Nutrition Goals:     Nutrition Therapy & Goals - 06/08/15 1153    Intervention Plan   Intervention Prescribe, educate and counsel regarding individualized specific dietary modifications aiming towards targeted core components such as weight, hypertension, lipid management, diabetes, heart failure and other comorbidities.  Arlesia has nutrition counseling provided through her  insurance and physician team.   Expected Outcomes Long Term Goal: Adherence to prescribed nutrition plan.      Nutrition Discharge: Rate Your Plate Scores:   Psychosocial: Target Goals: Acknowledge presence or absence of depression, maximize coping skills, provide positive support system. Participant is able to verbalize types and ability to use techniques and skills needed for reducing stress and depression.  Initial Review & Psychosocial Screening:     Initial Psych Review & Screening - 06/08/15 1144    Initial Review   Current issues with Current Sleep Concerns  NOt sleeping well ever since hurt her back.  CAn fall asleep quickly, if wakes up has hard time falling back asleep.  Is sleeping better currently since is on oxygen during sleep.  Tiredness  happens when COPD symptoms flare.  Otherwise has good energy.   Family Dynamics   Good Support System? Yes  Husband and two sons at home.     Barriers   Psychosocial  barriers to participate in program There are no identifiable barriers or psychosocial needs.;The patient should benefit from training in stress management and relaxation.   Screening Interventions   Interventions Encouraged to exercise      Quality of Life Scores:  Quality of Life - 06/08/15 1659    Quality of Life Scores   Health/Function Pre 16.31 %   Socioeconomic Pre 28.29 %   Psych/Spiritual Pre 28.14 %   Family Pre 26.4 %   GLOBAL Pre 22.51 %      PHQ-9:     Recent Review Flowsheet Data    Depression screen [facility] 2/9 06/08/2015   Decreased Interest 1   Down, Depressed, Hopeless 0   PHQ - 2 Score 1   Altered sleeping 1   Tired, decreased energy 2   Change in appetite 1   Feeling bad or failure about yourself  0   Trouble concentrating 1   Moving slowly or fidgety/restless 1   Suicidal thoughts 0   PHQ-9 Score 7   Difficult doing work/chores Not difficult at all      Psychosocial Evaluation and Intervention:   Psychosocial Re-Evaluation:  Education: Education Goals: Education classes will be provided on a weekly basis, covering required topics. Participant will state understanding/return demonstration of topics presented.  Learning Barriers/Preferences:     Learning Barriers/Preferences - 06/08/15 1159    Learning Barriers/Preferences   Learning Barriers None   Learning Preferences None      Education Topics: Initial Evaluation Education: - Verbal, written and demonstration of respiratory meds, RPE/PD scales, oximetry and breathing techniques. Instruction on use of nebulizers and MDIs: cleaning and proper use, rinsing mouth with steroid doses and importance of monitoring MDI activations.   General Nutrition Guidelines/Fats and Fiber: -Group instruction provided by verbal, written material, models and posters to present the general guidelines for heart healthy nutrition. Gives an explanation and review of dietary fats and fiber.   Controlling  Sodium/Reading Food Labels: -Group verbal and written material supporting the discussion of sodium use in heart healthy nutrition. Review and explanation with models, verbal and written materials for utilization of the food label.   Exercise Physiology & Risk Factors: - Group verbal and written instruction with models to review the exercise physiology of the cardiovascular system and associated critical values. Details cardiovascular disease risk factors and the goals associated with each risk factor.   Aerobic Exercise & Resistance Training: - Gives group verbal and written discussion on the health impact of inactivity. On the components of aerobic and resistive training programs and the benefits of this training and how to safely progress through these programs.   Flexibility, Balance, General Exercise Guidelines: - Provides group verbal and written instruction on the benefits of flexibility and balance training programs. Provides general exercise guidelines with specific guidelines to those with heart or lung disease. Demonstration and skill practice provided.   Stress Management: - Provides group verbal and written instruction about the health risks of elevated stress, cause of high stress, and healthy ways to reduce stress.   Depression: - Provides group verbal and written instruction on the correlation between heart/lung disease and depressed mood, treatment options, and the stigmas associated with seeking treatment.   Exercise & Equipment Safety: - Individual verbal instruction and demonstration of equipment use and safety with use of the equipment.          Pulmonary Rehab from 06/08/2015 in [facility] Cardiac and Pulmonary Rehab   Date  06/08/15   Educator  SB   Instruction Review Code  2- meets goals/outcomes      Infection Prevention: - Provides verbal and written material to individual with discussion of infection control including proper hand washing and proper equipment  cleaning during exercise session.  Pulmonary Rehab from 06/08/2015 in [facility] Cardiac and Pulmonary Rehab   Date  06/08/15   Educator  Sb   Instruction Review Code  2- meets goals/outcomes      Falls Prevention: - Provides verbal and written material to individual with discussion of falls prevention and safety.      Pulmonary Rehab from 06/08/2015 in [facility] Cardiac and Pulmonary Rehab   Date  06/08/15   Educator  Sb   Instruction Review Code  2- meets goals/outcomes      Diabetes: - Individual verbal and written instruction to review signs/symptoms of diabetes, desired ranges of glucose level fasting, after meals and with exercise. Advice that pre and post exercise glucose checks will be done for 3 sessions at entry of program.   Chronic Lung Diseases: - Group verbal and written instruction to review new updates, new respiratory medications, new advancements in procedures and treatments. Provide informative websites and "800" numbers of self-education.   Lung Procedures: - Group verbal and written instruction to describe testing methods done to diagnose lung disease. Review the outcome of test results. Describe the treatment choices: Pulmonary Function Tests, ABGs and oximetry.   Energy Conservation: - Provide group verbal and written instruction for methods to conserve energy, plan and organize activities. Instruct on pacing techniques, use of adaptive equipment and posture/positioning to relieve shortness of breath.   Triggers: - Group verbal and written instruction to review types of environmental controls: home humidity, furnaces, filters, dust mite/pet prevention, HEPA vacuums. To discuss weather changes, air quality and the benefits of nasal washing.   Exacerbations: - Group verbal and written instruction to provide: warning signs, infection symptoms, calling MD promptly, preventive modes, and value of vaccinations. Review: effective airway clearance, coughing and/or  vibration techniques. Create an Sport and exercise psychologist.   Oxygen: - Individual and group verbal and written instruction on oxygen therapy. Includes supplement oxygen, available portable oxygen systems, continuous and intermittent flow rates, oxygen safety, concentrators, and Medicare reimbursement for oxygen.   Respiratory Medications: - Group verbal and written instruction to review medications for lung disease. Drug class, frequency, complications, importance of spacers, rinsing mouth after steroid MDI's, and proper cleaning methods for nebulizers.   AED/CPR: - Group verbal and written instruction with the use of models to demonstrate the basic use of the AED with the basic ABC's of resuscitation.   Breathing Retraining: - Provides individuals verbal and written instruction on purpose, frequency, and proper technique of diaphragmatic breathing and pursed-lipped breathing. Applies individual practice skills.   Anatomy and Physiology of the Lungs: - Group verbal and written instruction with the use of models to provide basic lung anatomy and physiology related to function, structure and complications of lung disease.   Heart Failure: - Group verbal and written instruction on the basics of heart failure: signs/symptoms, treatments, explanation of ejection fraction, enlarged heart and cardiomyopathy.   Sleep Apnea: - Individual verbal and written instruction to review Obstructive Sleep Apnea. Review of risk factors, methods for diagnosing and types of masks and machines for OSA.   Anxiety: - Provides group, verbal and written instruction on the correlation between heart/lung disease and anxiety, treatment options, and management of anxiety.   Relaxation: - Provides group, verbal and written instruction about the benefits of relaxation for patients with heart/lung disease. Also provides patients with examples of relaxation techniques.   Knowledge Questionnaire Score:     Knowledge  Questionnaire Score - 06/08/15 1201    Knowledge Questionnaire Score   Pre Score 7/10  Personal Goals and Risk Factors at Admission:   Personal Goals and Risk Factors Review:    Personal Goals Discharge (Final Personal Goals and Risk Factors Review):    ITP Comments:     ITP Comments      06/08/15 1201           ITP Comments Initial ITP, Orientation completed. Goals reviewed and expected to reach goals by end of program.  Continue with ITP.          Comments:

## 2015-06-10 NOTE — Progress Notes (Signed)
Pulmonary Individual Treatment Plan  Patient Details  Name: Kristi Walter MRN: [id number] Date of Birth: 1960-10-23 Referring Provider:  Yevonne Pax, MD  Initial Encounter Date: Date: 06/08/15  Visit Diagnosis: COPD, moderate (HCC)  Patient's Home Medications on Admission:  Current outpatient prescriptions:  .  albuterol (PROAIR HFA) 108 (90 Base) MCG/ACT inhaler, , Disp: , Rfl:  .  benzonatate (TESSALON) 100 MG capsule, Take 100 mg by mouth 3 (three) times daily as needed for cough., Disp: , Rfl:  .  budesonide-formoterol (SYMBICORT) 160-4.5 MCG/ACT inhaler, , Disp: , Rfl:  .  EPINEPHrine 0.3 mg/0.3 mL IJ SOAJ injection, Inject into the muscle., Disp: , Rfl:  .  fluticasone (FLONASE) 50 MCG/ACT nasal spray, , Disp: , Rfl:  .  ipratropium-albuterol (DUONEB) 0.5-2.5 (3) MG/3ML SOLN, , Disp: , Rfl:  .  omeprazole (PRILOSEC) 20 MG capsule, Take by mouth., Disp: , Rfl:  .  theophylline (THEO-24) 100 MG 24 hr capsule, Take by mouth., Disp: , Rfl:  .  tiotropium (SPIRIVA) 18 MCG inhalation capsule, , Disp: , Rfl:   Past Medical History: History reviewed. No pertinent past medical history.  Tobacco Use: History  Smoking status  . Current Every Day Smoker -- 0.25 packs/day for 30 years  . Types: Cigarettes  Smokeless tobacco  . Never Used    Labs: Recent Review Flowsheet Data    Labs for ITP Cardiac and Pulmonary Rehab Latest Ref Rng 03/21/2012   Cholestrol 0-200 mg/dL 409(W)   LDLCALC 1-191 mg/dL 478(G)   HDL 95-62 mg/dL 60   Trlycerides 1-308 mg/dL 95       ADL UCSD:     ADL UCSD      06/08/15 1701       ADL UCSD   ADL Phase Entry     SOB Score total 67     Rest 0     Walk 2     Stairs 4     Bath 3     Dress 3     Shop 4         Pulmonary Function Assessment:   Exercise Target Goals: Date: 06/08/15  Exercise Program Goal: Individual exercise prescription set with THRR, safety & activity barriers. Participant demonstrates ability to understand and  report RPE using BORG scale, to self-measure pulse accurately, and to acknowledge the importance of the exercise prescription.  Exercise Prescription Goal: Starting with aerobic activity 30 plus minutes a day, 3 days per week for initial exercise prescription. Provide home exercise prescription and guidelines that participant acknowledges understanding prior to discharge.  Activity Barriers & Risk Stratification:   6 Minute Walk:     6 Minute Walk      06/08/15 1301       6 Minute Walk   Phase Initial     Distance 1165 feet     Walk Time 6 minutes     # of Rest Breaks 2     MPH 2.2     RPE 19     Perceived Dyspnea  9     Symptoms No     Resting HR 82 bpm     Resting BP 148/72 mmHg     Max Ex. HR 112 bpm     Max Ex. BP 154/70 mmHg        Initial Exercise Prescription:     Initial Exercise Prescription - 06/08/15 1300    Date of Initial Exercise Prescription   Date 06/08/15   Treadmill  MPH 2   Grade 0   Minutes 5  Try 2 sets, resting between sets as needed   Bike   Level 0.4   Watts 15   Minutes 10   Recumbant Bike   Level 1.5   Watts 15   Minutes 10  May break time up into increments   NuStep   Level 2   Watts 35   Minutes 10   Recumbant Elliptical   Level 1   RPM 40   Minutes 10   Elliptical   Level 1   Speed 2.5   Minutes 1  Begin in one minute increments, increasing time as tolerated   REL-XR   Level 2   Watts 30   Minutes 10  May use increments to achieve 10 minute total   T5 Nustep   Level 2   Watts 20   Minutes 10  Can utilize intervals to complete 10 minutes   Biostep-RELP   Level 2   Watts 30   Minutes 5  Begin with 5 minutes and increase as able   Prescription Details   Frequency (times per week) 3   Duration Progress to 30 minutes of continuous aerobic without signs/symptoms of physical distress   Intensity   THRR REST +  30   Ratings of Perceived Exertion 11-15   Perceived Dyspnea 2-4   Progression Continue progressive  overload as per policy without signs/symptoms or physical distress.   Resistance Training   Training Prescription Yes   Weight 3   Reps 10-12      Exercise Prescription Changes:   Discharge Exercise Prescription (Final Exercise Prescription Changes):    Nutrition:  Target Goals: Understanding of nutrition guidelines, daily intake of sodium 1500mg , cholesterol 200mg , calories 30% from fat and 7% or less from saturated fats, daily to have 5 or more servings of fruits and vegetables.  Biometrics:    Nutrition Therapy Plan and Nutrition Goals:     Nutrition Therapy & Goals - 06/08/15 1153    Intervention Plan   Intervention Prescribe, educate and counsel regarding individualized specific dietary modifications aiming towards targeted core components such as weight, hypertension, lipid management, diabetes, heart failure and other comorbidities.  Kristi Walter has nutrition counseling provided through her  insurance and physician team.   Expected Outcomes Long Term Goal: Adherence to prescribed nutrition plan.      Nutrition Discharge: Rate Your Plate Scores:   Psychosocial: Target Goals: Acknowledge presence or absence of depression, maximize coping skills, provide positive support system. Participant is able to verbalize types and ability to use techniques and skills needed for reducing stress and depression.  Initial Review & Psychosocial Screening:     Initial Psych Review & Screening - 06/08/15 1144    Initial Review   Current issues with Current Sleep Concerns  NOt sleeping well ever since hurt her back.  CAn fall asleep quickly, if wakes up has hard time falling back asleep.  Is sleeping better currently since is on oxygen during sleep.  Tiredness  happens when COPD symptoms flare.  Otherwise has good energy.   Family Dynamics   Good Support System? Yes  Husband and two sons at home.     Barriers   Psychosocial barriers to participate in program There are no identifiable  barriers or psychosocial needs.;The patient should benefit from training in stress management and relaxation.   Screening Interventions   Interventions Encouraged to exercise      Quality of Life Scores:  Quality of Life - 06/08/15 1659    Quality of Life Scores   Health/Function Pre 16.31 %   Socioeconomic Pre 28.29 %   Psych/Spiritual Pre 28.14 %   Family Pre 26.4 %   GLOBAL Pre 22.51 %      PHQ-9:     Recent Review Flowsheet Data    Depression screen [facility] 2/9 06/08/2015   Decreased Interest 1   Down, Depressed, Hopeless 0   PHQ - 2 Score 1   Altered sleeping 1   Tired, decreased energy 2   Change in appetite 1   Feeling bad or failure about yourself  0   Trouble concentrating 1   Moving slowly or fidgety/restless 1   Suicidal thoughts 0   PHQ-9 Score 7   Difficult doing work/chores Not difficult at all      Psychosocial Evaluation and Intervention:   Psychosocial Re-Evaluation:  Education: Education Goals: Education classes will be provided on a weekly basis, covering required topics. Participant will state understanding/return demonstration of topics presented.  Learning Barriers/Preferences:     Learning Barriers/Preferences - 06/08/15 1159    Learning Barriers/Preferences   Learning Barriers None   Learning Preferences None      Education Topics: Initial Evaluation Education: - Verbal, written and demonstration of respiratory meds, RPE/PD scales, oximetry and breathing techniques. Instruction on use of nebulizers and MDIs: cleaning and proper use, rinsing mouth with steroid doses and importance of monitoring MDI activations.   General Nutrition Guidelines/Fats and Fiber: -Group instruction provided by verbal, written material, models and posters to present the general guidelines for heart healthy nutrition. Gives an explanation and review of dietary fats and fiber.   Controlling Sodium/Reading Food Labels: -Group verbal and written material  supporting the discussion of sodium use in heart healthy nutrition. Review and explanation with models, verbal and written materials for utilization of the food label.   Exercise Physiology & Risk Factors: - Group verbal and written instruction with models to review the exercise physiology of the cardiovascular system and associated critical values. Details cardiovascular disease risk factors and the goals associated with each risk factor.   Aerobic Exercise & Resistance Training: - Gives group verbal and written discussion on the health impact of inactivity. On the components of aerobic and resistive training programs and the benefits of this training and how to safely progress through these programs.   Flexibility, Balance, General Exercise Guidelines: - Provides group verbal and written instruction on the benefits of flexibility and balance training programs. Provides general exercise guidelines with specific guidelines to those with heart or lung disease. Demonstration and skill practice provided.   Stress Management: - Provides group verbal and written instruction about the health risks of elevated stress, cause of high stress, and healthy ways to reduce stress.   Depression: - Provides group verbal and written instruction on the correlation between heart/lung disease and depressed mood, treatment options, and the stigmas associated with seeking treatment.   Exercise & Equipment Safety: - Individual verbal instruction and demonstration of equipment use and safety with use of the equipment.          Pulmonary Rehab from 06/08/2015 in [facility] Cardiac [facility] Pulmonary Rehab   Date  06/08/15   Educator  SB   Instruction Review Code  2- meets goals/outcomes      Infection Prevention: - Provides verbal and written material to individual with discussion of infection control including proper hand washing and proper equipment cleaning during exercise session.  Pulmonary Rehab from  06/08/2015 in [facility] Cardiac and Pulmonary Rehab   Date  06/08/15   Educator  Sb   Instruction Review Code  2- meets goals/outcomes      Falls Prevention: - Provides verbal and written material to individual with discussion of falls prevention and safety.      Pulmonary Rehab from 06/08/2015 in [facility] Cardiac and Pulmonary Rehab   Date  06/08/15   Educator  Sb   Instruction Review Code  2- meets goals/outcomes      Diabetes: - Individual verbal and written instruction to review signs/symptoms of diabetes, desired ranges of glucose level fasting, after meals and with exercise. Advice that pre and post exercise glucose checks will be done for 3 sessions at entry of program.   Chronic Lung Diseases: - Group verbal and written instruction to review new updates, new respiratory medications, new advancements in procedures and treatments. Provide informative websites and "800" numbers of self-education.   Lung Procedures: - Group verbal and written instruction to describe testing methods done to diagnose lung disease. Review the outcome of test results. Describe the treatment choices: Pulmonary Function Tests, ABGs and oximetry.   Energy Conservation: - Provide group verbal and written instruction for methods to conserve energy, plan and organize activities. Instruct on pacing techniques, use of adaptive equipment and posture/positioning to relieve shortness of breath.   Triggers: - Group verbal and written instruction to review types of environmental controls: home humidity, furnaces, filters, dust mite/pet prevention, HEPA vacuums. To discuss weather changes, air quality and the benefits of nasal washing.   Exacerbations: - Group verbal and written instruction to provide: warning signs, infection symptoms, calling MD promptly, preventive modes, and value of vaccinations. Review: effective airway clearance, coughing and/or vibration techniques. Create an Sport and exercise psychologist.   Oxygen: -  Individual and group verbal and written instruction on oxygen therapy. Includes supplement oxygen, available portable oxygen systems, continuous and intermittent flow rates, oxygen safety, concentrators, and Medicare reimbursement for oxygen.   Respiratory Medications: - Group verbal and written instruction to review medications for lung disease. Drug class, frequency, complications, importance of spacers, rinsing mouth after steroid MDI's, and proper cleaning methods for nebulizers.   AED/CPR: - Group verbal and written instruction with the use of models to demonstrate the basic use of the AED with the basic ABC's of resuscitation.   Breathing Retraining: - Provides individuals verbal and written instruction on purpose, frequency, and proper technique of diaphragmatic breathing and pursed-lipped breathing. Applies individual practice skills.   Anatomy and Physiology of the Lungs: - Group verbal and written instruction with the use of models to provide basic lung anatomy and physiology related to function, structure and complications of lung disease.   Heart Failure: - Group verbal and written instruction on the basics of heart failure: signs/symptoms, treatments, explanation of ejection fraction, enlarged heart and cardiomyopathy.   Sleep Apnea: - Individual verbal and written instruction to review Obstructive Sleep Apnea. Review of risk factors, methods for diagnosing and types of masks and machines for OSA.   Anxiety: - Provides group, verbal and written instruction on the correlation between heart/lung disease and anxiety, treatment options, and management of anxiety.   Relaxation: - Provides group, verbal and written instruction about the benefits of relaxation for patients with heart/lung disease. Also provides patients with examples of relaxation techniques.   Knowledge Questionnaire Score:     Knowledge Questionnaire Score - 06/08/15 1201    Knowledge Questionnaire  Score   Pre Score 7/10  Personal Goals and Risk Factors at Admission:     Personal Goals and Risk Factors at Admission - 06/08/15 1145    Core Components/Risk Factors/Patient Goals on Admission    Weight Management Yes;Obesity   Intervention Weight Management: Develop a combined nutrition and exercise program designed to reach desired caloric intake, while maintaining appropriate intake of nutrient and fiber, sodium and fats, and appropriate energy expenditure required for the weight goal.;Weight Management: Provide education and appropriate resources to help participant work on and attain dietary goals.;Weight Management/Obesity: Establish reasonable short term and long term weight goals.;Obesity: Provide education and appropriate resources to help participant work on and attain dietary goals.   Admit Weight 180 lb (81.647 kg)   Goal Weight: Short Term 175 lb (79.379 kg)   Goal Weight: Long Term 130 lb (58.968 kg)   Expected Outcomes Short Term: Continue to assess and modify interventions until short term weight is achieved.;Long Term: Adherence to nutrition and physical activity/exercise program aimed toward attainment of established weight goal.   Sedentary Yes   Intervention Provide advice, education, support and counseling about physical activity/exercise needs.;Develop an individualized exercise prescription for aerobic and resistive training based on initial evaluation findings, risk stratification, comorbidities and participant's personal goals.   Expected Outcomes Achievement of increased cardiorespiratory fitness and enhanced flexibility, muscular endurance and strength shown through measurements of functional capaciy and personal statement of participant.   Tobacco Cessation Yes   Number of packs per day .025 pack per day   Intervention Assist the participant in steps to quit. Provide individualized education and counseling about committing to Tobacco Cessation, relapse  prevention, and pharmacological support that can be provided by physician.;Education officer, environmental, assist with locating and accessing local/national Quit Smoking programs, and support quit date choice.   Expected Outcomes Short Term: Will demonstrate readiness to quit, by selecting a quit date.;Long Term: Complete abstinence from all tobacco products for at least 12 months from quit date.   Improve shortness of breath with ADL's Yes  Does have SOB with activities   Intervention Provide education, individualized exercise plan and daily activity instruction to help decrease symptoms of SOB with activities of daily living.   Expected Outcomes Short Term: Achieves a reduction of symptoms when performing activities of daily living.   Develop more efficient breathing techniques such as purse lipped breathing and diaphragmatic breathing; and practicing self-pacing with activity Yes  Does use Pursed lipped breathing at times. Demonstrated good technique   Intervention Provide education, demonstration and support about specific breathing techniuqes utilized for more efficient breathing. Include techniques such as pursed lipped breathing, diaphragmatic breathing and self-pacing activity.   Expected Outcomes Short Term: Participant will be able to demonstrate and use breathing techniques as needed throughout daily activities.   Increase knowledge of respiratory medications and ability to use respiratory devices properly  Yes  Proventil HFA, Spiriva, Symbicort, Atrovent/Albuterol Neb   Intervention Provide education and demonstration as needed of appropriate use of medications, inhalers, and oxygen therapy.   Expected Outcomes Short Term: Achieves understanding of medications use. Understands that oxygen is a medication prescribed by physician. Demonstrates appropriate use of inhaler and oxygen therapy.      Personal Goals and Risk Factors Review:    Personal Goals Discharge (Final Personal Goals and  Risk Factors Review):    ITP Comments:     ITP Comments      06/08/15 1201           ITP Comments Initial ITP, Orientation completed. Goals  reviewed and expected to reach goals by end of program.  Continue with ITP.          Comments:

## 2015-06-10 NOTE — Addendum Note (Signed)
Addended by: Rudy Jew on: 06/10/2015 03:32 PM   Modules accepted: Orders

## 2015-06-14 NOTE — Progress Notes (Signed)
Pulmonary Individual Treatment Plan  Patient Details  Name: Kristi Walter MRN: [id number] Date of Birth: 1960/11/22 Referring Provider:  Yevonne Pax, MD  Initial Encounter Date: Date: 06/08/15  Visit Diagnosis: COPD, moderate (HCC) - Plan: PULMONARY REHAB 30 DAY REVIEW  Patient's Home Medications on Admission:  Current outpatient prescriptions:    albuterol (PROAIR HFA) 108 (90 Base) MCG/ACT inhaler, , Disp: , Rfl:    benzonatate (TESSALON) 100 MG capsule, Take 100 mg by mouth 3 (three) times daily as needed for cough., Disp: , Rfl:    budesonide-formoterol (SYMBICORT) 160-4.5 MCG/ACT inhaler, , Disp: , Rfl:    EPINEPHrine 0.3 mg/0.3 mL IJ SOAJ injection, Inject into the muscle., Disp: , Rfl:    fluticasone (FLONASE) 50 MCG/ACT nasal spray, , Disp: , Rfl:    ipratropium-albuterol (DUONEB) 0.5-2.5 (3) MG/3ML SOLN, , Disp: , Rfl:    omeprazole (PRILOSEC) 20 MG capsule, Take by mouth., Disp: , Rfl:    theophylline (THEO-24) 100 MG 24 hr capsule, Take by mouth., Disp: , Rfl:    tiotropium (SPIRIVA) 18 MCG inhalation capsule, , Disp: , Rfl:   Past Medical History: History reviewed. No pertinent past medical history.  Tobacco Use: History  Smoking status   Current Every Day Smoker -- 0.25 packs/day for 30 years   Types: Cigarettes  Smokeless tobacco   Never Used    Labs: Recent Review Flowsheet Data    Labs for ITP Cardiac and Pulmonary Rehab Latest Ref Rng 03/21/2012   Cholestrol 0-200 mg/dL 409(W)   LDLCALC 1-191 mg/dL 478(G)   HDL 95-62 mg/dL 60   Trlycerides 1-308 mg/dL 95       ADL UCSD:     ADL UCSD      06/08/15 1701       ADL UCSD   ADL Phase Entry     SOB Score total 67     Rest 0     Walk 2     Stairs 4     Bath 3     Dress 3     Shop 4         Pulmonary Function Assessment:   Exercise Target Goals: Date: 06/08/15  Exercise Program Goal: Individual exercise prescription set with THRR, safety & activity barriers. Participant  demonstrates ability to understand and report RPE using BORG scale, to self-measure pulse accurately, and to acknowledge the importance of the exercise prescription.  Exercise Prescription Goal: Starting with aerobic activity 30 plus minutes a day, 3 days per week for initial exercise prescription. Provide home exercise prescription and guidelines that participant acknowledges understanding prior to discharge.  Activity Barriers & Risk Stratification:   6 Minute Walk:     6 Minute Walk      06/08/15 1301       6 Minute Walk   Phase Initial     Distance 1165 feet     Walk Time 6 minutes     # of Rest Breaks 2     MPH 2.2     RPE 19     Perceived Dyspnea  9     Symptoms No     Resting HR 82 bpm     Resting BP 148/72 mmHg     Max Ex. HR 112 bpm     Max Ex. BP 154/70 mmHg        Initial Exercise Prescription:     Initial Exercise Prescription - 06/08/15 1300    Date of Initial Exercise Prescription  Date 06/08/15   Treadmill   MPH 2   Grade 0   Minutes 5  Try 2 sets, resting between sets as needed   Bike   Level 0.4   Watts 15   Minutes 10   Recumbant Bike   Level 1.5   Watts 15   Minutes 10  May break time up into increments   NuStep   Level 2   Watts 35   Minutes 10   Recumbant Elliptical   Level 1   RPM 40   Minutes 10   Elliptical   Level 1   Speed 2.5   Minutes 1  Begin in one minute increments, increasing time as tolerated   REL-XR   Level 2   Watts 30   Minutes 10  May use increments to achieve 10 minute total   T5 Nustep   Level 2   Watts 20   Minutes 10  Can utilize intervals to complete 10 minutes   Biostep-RELP   Level 2   Watts 30   Minutes 5  Begin with 5 minutes and increase as able   Prescription Details   Frequency (times per week) 3   Duration Progress to 30 minutes of continuous aerobic without signs/symptoms of physical distress   Intensity   THRR REST +  30   Ratings of Perceived Exertion 11-15   Perceived Dyspnea  2-4   Progression   Progression Continue progressive overload as per policy without signs/symptoms or physical distress.   Resistance Training   Training Prescription Yes   Weight 3   Reps 10-12      Exercise Prescription Changes:   Discharge Exercise Prescription (Final Exercise Prescription Changes):    Nutrition:  Target Goals: Understanding of nutrition guidelines, daily intake of sodium 1500mg , cholesterol 200mg , calories 30% from fat and 7% or less from saturated fats, daily to have 5 or more servings of fruits and vegetables.  Biometrics:    Nutrition Therapy Plan and Nutrition Goals:     Nutrition Therapy & Goals - 06/08/15 1153    Intervention Plan   Intervention Prescribe, educate and counsel regarding individualized specific dietary modifications aiming towards targeted core components such as weight, hypertension, lipid management, diabetes, heart failure and other comorbidities.  Khadeja has nutrition counseling provided through her  insurance and physician team.   Expected Outcomes Long Term Goal: Adherence to prescribed nutrition plan.      Nutrition Discharge: Rate Your Plate Scores:   Psychosocial: Target Goals: Acknowledge presence or absence of depression, maximize coping skills, provide positive support system. Participant is able to verbalize types and ability to use techniques and skills needed for reducing stress and depression.  Initial Review & Psychosocial Screening:     Initial Psych Review & Screening - 06/08/15 1144    Initial Review   Current issues with Current Sleep Concerns  NOt sleeping well ever since hurt her back.  CAn fall asleep quickly, if wakes up has hard time falling back asleep.  Is sleeping better currently since is on oxygen during sleep.  Tiredness  happens when COPD symptoms flare.  Otherwise has good energy.   Family Dynamics   Good Support System? Yes  Husband and two sons at home.     Barriers   Psychosocial  barriers to participate in program There are no identifiable barriers or psychosocial needs.;The patient should benefit from training in stress management and relaxation.   Screening Interventions   Interventions Encouraged to exercise  Quality of Life Scores:     Quality of Life - 06/08/15 1659    Quality of Life Scores   Health/Function Pre 16.31 %   Socioeconomic Pre 28.29 %   Psych/Spiritual Pre 28.14 %   Family Pre 26.4 %   GLOBAL Pre 22.51 %      PHQ-9:     Recent Review Flowsheet Data    Depression screen [facility] 2/9 06/08/2015   Decreased Interest 1   Down, Depressed, Hopeless 0   PHQ - 2 Score 1   Altered sleeping 1   Tired, decreased energy 2   Change in appetite 1   Feeling bad or failure about yourself  0   Trouble concentrating 1   Moving slowly or fidgety/restless 1   Suicidal thoughts 0   PHQ-9 Score 7   Difficult doing work/chores Not difficult at all      Psychosocial Evaluation and Intervention:   Psychosocial Re-Evaluation:  Education: Education Goals: Education classes will be provided on a weekly basis, covering required topics. Participant will state understanding/return demonstration of topics presented.  Learning Barriers/Preferences:     Learning Barriers/Preferences - 06/08/15 1159    Learning Barriers/Preferences   Learning Barriers None   Learning Preferences None      Education Topics: Initial Evaluation Education: - Verbal, written and demonstration of respiratory meds, RPE/PD scales, oximetry and breathing techniques. Instruction on use of nebulizers and MDIs: cleaning and proper use, rinsing mouth with steroid doses and importance of monitoring MDI activations.   General Nutrition Guidelines/Fats and Fiber: -Group instruction provided by verbal, written material, models and posters to present the general guidelines for heart healthy nutrition. Gives an explanation and review of dietary fats and fiber.   Controlling  Sodium/Reading Food Labels: -Group verbal and written material supporting the discussion of sodium use in heart healthy nutrition. Review and explanation with models, verbal and written materials for utilization of the food label.   Exercise Physiology & Risk Factors: - Group verbal and written instruction with models to review the exercise physiology of the cardiovascular system and associated critical values. Details cardiovascular disease risk factors and the goals associated with each risk factor.   Aerobic Exercise & Resistance Training: - Gives group verbal and written discussion on the health impact of inactivity. On the components of aerobic and resistive training programs and the benefits of this training and how to safely progress through these programs.   Flexibility, Balance, General Exercise Guidelines: - Provides group verbal and written instruction on the benefits of flexibility and balance training programs. Provides general exercise guidelines with specific guidelines to those with heart or lung disease. Demonstration and skill practice provided.   Stress Management: - Provides group verbal and written instruction about the health risks of elevated stress, cause of high stress, and healthy ways to reduce stress.   Depression: - Provides group verbal and written instruction on the correlation between heart/lung disease and depressed mood, treatment options, and the stigmas associated with seeking treatment.   Exercise & Equipment Safety: - Individual verbal instruction and demonstration of equipment use and safety with use of the equipment.          Pulmonary Rehab from 06/08/2015 in [facility] Cardiac and Pulmonary Rehab   Date  06/08/15   Educator  SB   Instruction Review Code  2- meets goals/outcomes      Infection Prevention: - Provides verbal and written material to individual with discussion of infection control including proper hand washing and proper  equipment  cleaning during exercise session.      Pulmonary Rehab from 06/08/2015 in [facility] Cardiac and Pulmonary Rehab   Date  06/08/15   Educator  Sb   Instruction Review Code  2- meets goals/outcomes      Falls Prevention: - Provides verbal and written material to individual with discussion of falls prevention and safety.      Pulmonary Rehab from 06/08/2015 in [facility] Cardiac and Pulmonary Rehab   Date  06/08/15   Educator  Sb   Instruction Review Code  2- meets goals/outcomes      Diabetes: - Individual verbal and written instruction to review signs/symptoms of diabetes, desired ranges of glucose level fasting, after meals and with exercise. Advice that pre and post exercise glucose checks will be done for 3 sessions at entry of program.   Chronic Lung Diseases: - Group verbal and written instruction to review new updates, new respiratory medications, new advancements in procedures and treatments. Provide informative websites and "800" numbers of self-education.   Lung Procedures: - Group verbal and written instruction to describe testing methods done to diagnose lung disease. Review the outcome of test results. Describe the treatment choices: Pulmonary Function Tests, ABGs and oximetry.   Energy Conservation: - Provide group verbal and written instruction for methods to conserve energy, plan and organize activities. Instruct on pacing techniques, use of adaptive equipment and posture/positioning to relieve shortness of breath.   Triggers: - Group verbal and written instruction to review types of environmental controls: home humidity, furnaces, filters, dust mite/pet prevention, HEPA vacuums. To discuss weather changes, air quality and the benefits of nasal washing.   Exacerbations: - Group verbal and written instruction to provide: warning signs, infection symptoms, calling MD promptly, preventive modes, and value of vaccinations. Review: effective airway clearance, coughing and/or  vibration techniques. Create an Sport and exercise psychologist.   Oxygen: - Individual and group verbal and written instruction on oxygen therapy. Includes supplement oxygen, available portable oxygen systems, continuous and intermittent flow rates, oxygen safety, concentrators, and Medicare reimbursement for oxygen.   Respiratory Medications: - Group verbal and written instruction to review medications for lung disease. Drug class, frequency, complications, importance of spacers, rinsing mouth after steroid MDI's, and proper cleaning methods for nebulizers.   AED/CPR: - Group verbal and written instruction with the use of models to demonstrate the basic use of the AED with the basic ABC's of resuscitation.   Breathing Retraining: - Provides individuals verbal and written instruction on purpose, frequency, and proper technique of diaphragmatic breathing and pursed-lipped breathing. Applies individual practice skills.   Anatomy and Physiology of the Lungs: - Group verbal and written instruction with the use of models to provide basic lung anatomy and physiology related to function, structure and complications of lung disease.   Heart Failure: - Group verbal and written instruction on the basics of heart failure: signs/symptoms, treatments, explanation of ejection fraction, enlarged heart and cardiomyopathy.   Sleep Apnea: - Individual verbal and written instruction to review Obstructive Sleep Apnea. Review of risk factors, methods for diagnosing and types of masks and machines for OSA.   Anxiety: - Provides group, verbal and written instruction on the correlation between heart/lung disease and anxiety, treatment options, and management of anxiety.   Relaxation: - Provides group, verbal and written instruction about the benefits of relaxation for patients with heart/lung disease. Also provides patients with examples of relaxation techniques.   Knowledge Questionnaire Score:     Knowledge  Questionnaire Score - 06/08/15 1201  Knowledge Questionnaire Score   Pre Score 7/10      Personal Goals and Risk Factors at Admission:     Personal Goals and Risk Factors at Admission - 06/08/15 1145    Core Components/Risk Factors/Patient Goals on Admission    Weight Management Yes;Obesity   Intervention Weight Management: Develop a combined nutrition and exercise program designed to reach desired caloric intake, while maintaining appropriate intake of nutrient and fiber, sodium and fats, and appropriate energy expenditure required for the weight goal.;Weight Management: Provide education and appropriate resources to help participant work on and attain dietary goals.;Weight Management/Obesity: Establish reasonable short term and long term weight goals.;Obesity: Provide education and appropriate resources to help participant work on and attain dietary goals.   Admit Weight 180 lb (81.647 kg)   Goal Weight: Short Term 175 lb (79.379 kg)   Goal Weight: Long Term 130 lb (58.968 kg)   Expected Outcomes Short Term: Continue to assess and modify interventions until short term weight is achieved.;Long Term: Adherence to nutrition and physical activity/exercise program aimed toward attainment of established weight goal.   Sedentary Yes   Intervention Provide advice, education, support and counseling about physical activity/exercise needs.;Develop an individualized exercise prescription for aerobic and resistive training based on initial evaluation findings, risk stratification, comorbidities and participant's personal goals.   Expected Outcomes Achievement of increased cardiorespiratory fitness and enhanced flexibility, muscular endurance and strength shown through measurements of functional capaciy and personal statement of participant.   Tobacco Cessation Yes   Number of packs per day .025 pack per day   Intervention Assist the participant in steps to quit. Provide individualized education and  counseling about committing to Tobacco Cessation, relapse prevention, and pharmacological support that can be provided by physician.;Education officer, environmental, assist with locating and accessing local/national Quit Smoking programs, and support quit date choice.   Expected Outcomes Short Term: Will demonstrate readiness to quit, by selecting a quit date.;Long Term: Complete abstinence from all tobacco products for at least 12 months from quit date.   Improve shortness of breath with ADL's Yes  Does have SOB with activities   Intervention Provide education, individualized exercise plan and daily activity instruction to help decrease symptoms of SOB with activities of daily living.   Expected Outcomes Short Term: Achieves a reduction of symptoms when performing activities of daily living.   Develop more efficient breathing techniques such as purse lipped breathing and diaphragmatic breathing; and practicing self-pacing with activity Yes  Does use Pursed lipped breathing at times. Demonstrated good technique   Intervention Provide education, demonstration and support about specific breathing techniuqes utilized for more efficient breathing. Include techniques such as pursed lipped breathing, diaphragmatic breathing and self-pacing activity.   Expected Outcomes Short Term: Participant will be able to demonstrate and use breathing techniques as needed throughout daily activities.   Increase knowledge of respiratory medications and ability to use respiratory devices properly  Yes  Proventil HFA, Spiriva, Symbicort, Atrovent/Albuterol Neb   Intervention Provide education and demonstration as needed of appropriate use of medications, inhalers, and oxygen therapy.   Expected Outcomes Short Term: Achieves understanding of medications use. Understands that oxygen is a medication prescribed by physician. Demonstrates appropriate use of inhaler and oxygen therapy.      Personal Goals and Risk Factors Review:     Personal Goals Discharge (Final Personal Goals and Risk Factors Review):    ITP Comments:     ITP Comments      06/08/15 1201 06/14/15 1230  ITP Comments Initial ITP, Orientation completed. Goals reviewed and expected to reach goals by end of program.  Continue with ITP. Called Ms Sparr over conflict with class times. She is unable to attend [facility] because of her work schedule and requested to be discharged from [facility].         Comments: Discharging Ms Windish from [facility] due to work schedule.

## 2015-06-14 NOTE — Addendum Note (Signed)
Addended by: Alonza Bogus on: 06/14/2015 12:35 PM   Modules accepted: Orders

## 2015-06-15 ENCOUNTER — Ambulatory Visit: Payer: 59

## 2015-06-17 ENCOUNTER — Ambulatory Visit: Payer: 59

## 2015-06-20 ENCOUNTER — Ambulatory Visit: Payer: 59

## 2015-06-22 ENCOUNTER — Ambulatory Visit: Payer: 59

## 2015-06-24 ENCOUNTER — Ambulatory Visit: Payer: 59

## 2015-06-27 ENCOUNTER — Ambulatory Visit: Payer: 59

## 2015-06-29 ENCOUNTER — Ambulatory Visit: Payer: 59

## 2015-07-01 ENCOUNTER — Ambulatory Visit: Payer: 59

## 2015-07-04 ENCOUNTER — Ambulatory Visit: Payer: 59

## 2015-07-06 ENCOUNTER — Ambulatory Visit: Payer: 59

## 2015-07-08 ENCOUNTER — Ambulatory Visit: Payer: 59

## 2015-07-11 ENCOUNTER — Ambulatory Visit: Payer: 59

## 2015-07-13 ENCOUNTER — Ambulatory Visit: Payer: 59

## 2015-07-15 ENCOUNTER — Ambulatory Visit: Payer: 59

## 2015-07-18 ENCOUNTER — Ambulatory Visit: Payer: 59

## 2015-07-20 ENCOUNTER — Ambulatory Visit: Payer: 59

## 2015-07-22 ENCOUNTER — Ambulatory Visit: Payer: 59

## 2015-07-25 ENCOUNTER — Ambulatory Visit: Payer: 59

## 2015-07-27 ENCOUNTER — Ambulatory Visit: Payer: 59

## 2015-07-29 ENCOUNTER — Ambulatory Visit: Payer: 59

## 2015-08-01 ENCOUNTER — Ambulatory Visit: Payer: 59

## 2015-08-03 ENCOUNTER — Ambulatory Visit: Payer: 59

## 2015-08-05 ENCOUNTER — Ambulatory Visit: Payer: 59

## 2015-08-08 ENCOUNTER — Ambulatory Visit: Payer: 59

## 2015-08-10 ENCOUNTER — Ambulatory Visit: Payer: 59

## 2015-08-12 ENCOUNTER — Ambulatory Visit: Payer: 59

## 2015-08-15 ENCOUNTER — Ambulatory Visit: Payer: 59

## 2015-08-17 ENCOUNTER — Ambulatory Visit: Payer: 59

## 2015-08-19 ENCOUNTER — Ambulatory Visit: Payer: 59

## 2015-08-22 ENCOUNTER — Ambulatory Visit: Payer: 59

## 2015-08-24 ENCOUNTER — Ambulatory Visit: Payer: 59

## 2015-08-26 ENCOUNTER — Ambulatory Visit: Payer: 59

## 2015-08-29 ENCOUNTER — Ambulatory Visit: Payer: 59

## 2015-08-31 ENCOUNTER — Ambulatory Visit: Payer: 59

## 2015-09-02 ENCOUNTER — Ambulatory Visit: Payer: 59

## 2015-09-05 ENCOUNTER — Ambulatory Visit: Payer: 59

## 2015-11-02 ENCOUNTER — Ambulatory Visit
Admission: RE | Admit: 2015-11-02 | Discharge: 2015-11-02 | Disposition: A | Discharge status: Home / Self Care | Payer: 59 | Source: Ambulatory Visit | Attending: Internal Medicine | Admitting: Internal Medicine

## 2015-11-02 ENCOUNTER — Other Ambulatory Visit: Payer: Self-pay | Admitting: Internal Medicine

## 2015-11-02 DIAGNOSIS — R05 Cough: Secondary | ICD-10-CM | POA: Diagnosis present

## 2015-11-02 DIAGNOSIS — J449 Chronic obstructive pulmonary disease, unspecified: Secondary | ICD-10-CM | POA: Insufficient documentation

## 2015-11-02 DIAGNOSIS — R059 Cough, unspecified: Secondary | ICD-10-CM

## 2015-11-04 ENCOUNTER — Other Ambulatory Visit: Payer: Self-pay | Admitting: Physician Assistant

## 2015-11-04 DIAGNOSIS — J690 Pneumonitis due to inhalation of food and vomit: Secondary | ICD-10-CM

## 2015-11-14 ENCOUNTER — Ambulatory Visit: Admission: RE | Admit: 2015-11-14 | Payer: 59 | Source: Ambulatory Visit

## 2016-01-16 ENCOUNTER — Other Ambulatory Visit: Payer: Self-pay | Admitting: Internal Medicine

## 2016-01-16 DIAGNOSIS — R05 Cough: Secondary | ICD-10-CM

## 2016-01-16 DIAGNOSIS — R059 Cough, unspecified: Secondary | ICD-10-CM

## 2016-01-20 ENCOUNTER — Ambulatory Visit
Admission: RE | Admit: 2016-01-20 | Discharge: 2016-01-20 | Disposition: A | Discharge status: Home / Self Care | Payer: 59 | Source: Ambulatory Visit | Attending: Internal Medicine | Admitting: Internal Medicine

## 2016-01-20 DIAGNOSIS — I700 Atherosclerosis of aorta: Secondary | ICD-10-CM | POA: Insufficient documentation

## 2016-01-20 DIAGNOSIS — R918 Other nonspecific abnormal finding of lung field: Secondary | ICD-10-CM | POA: Diagnosis not present

## 2016-01-20 DIAGNOSIS — I708 Atherosclerosis of other arteries: Secondary | ICD-10-CM | POA: Insufficient documentation

## 2016-01-20 DIAGNOSIS — R059 Cough, unspecified: Secondary | ICD-10-CM

## 2016-01-20 DIAGNOSIS — R05 Cough: Secondary | ICD-10-CM | POA: Insufficient documentation

## 2016-01-20 DIAGNOSIS — J439 Emphysema, unspecified: Secondary | ICD-10-CM | POA: Insufficient documentation

## 2016-01-20 MED ORDER — IOPAMIDOL (ISOVUE-300) INJECTION 61%
75.0000 mL | Freq: Once | INTRAVENOUS | Status: AC | PRN
  Administered 2016-01-20: 75 mL via INTRAVENOUS

## 2016-02-16 ENCOUNTER — Other Ambulatory Visit: Payer: Self-pay | Admitting: Internal Medicine

## 2016-02-16 DIAGNOSIS — J189 Pneumonia, unspecified organism: Secondary | ICD-10-CM

## 2016-03-27 ENCOUNTER — Ambulatory Visit: Admission: RE | Admit: 2016-03-27 | Payer: 59 | Source: Ambulatory Visit

## 2016-03-27 ENCOUNTER — Ambulatory Visit
Admission: RE | Admit: 2016-03-27 | Discharge: 2016-03-27 | Disposition: A | Discharge status: Home / Self Care | Payer: 59 | Source: Ambulatory Visit | Attending: Internal Medicine | Admitting: Internal Medicine

## 2016-03-27 ENCOUNTER — Ambulatory Visit: Payer: 59

## 2016-03-27 DIAGNOSIS — J470 Bronchiectasis with acute lower respiratory infection: Secondary | ICD-10-CM | POA: Insufficient documentation

## 2016-03-27 DIAGNOSIS — R911 Solitary pulmonary nodule: Secondary | ICD-10-CM | POA: Insufficient documentation

## 2016-03-27 DIAGNOSIS — I700 Atherosclerosis of aorta: Secondary | ICD-10-CM | POA: Insufficient documentation

## 2016-03-27 DIAGNOSIS — J189 Pneumonia, unspecified organism: Secondary | ICD-10-CM

## 2016-03-27 DIAGNOSIS — I708 Atherosclerosis of other arteries: Secondary | ICD-10-CM | POA: Diagnosis not present

## 2016-03-27 DIAGNOSIS — E042 Nontoxic multinodular goiter: Secondary | ICD-10-CM | POA: Insufficient documentation

## 2016-03-27 MED ORDER — IOPAMIDOL (ISOVUE-300) INJECTION 61%
75.0000 mL | Freq: Once | INTRAVENOUS | Status: AC | PRN
  Administered 2016-03-27: 75 mL via INTRAVENOUS

## 2016-05-07 ENCOUNTER — Other Ambulatory Visit: Payer: Self-pay | Admitting: Internal Medicine

## 2016-05-07 DIAGNOSIS — R911 Solitary pulmonary nodule: Secondary | ICD-10-CM

## 2016-05-09 ENCOUNTER — Ambulatory Visit
Admission: RE | Admit: 2016-05-09 | Discharge: 2016-05-09 | Disposition: A | Discharge status: Home / Self Care | Payer: 59 | Source: Ambulatory Visit | Attending: Internal Medicine | Admitting: Internal Medicine

## 2016-05-09 ENCOUNTER — Encounter: Payer: Self-pay | Admitting: Radiology

## 2016-05-09 DIAGNOSIS — I700 Atherosclerosis of aorta: Secondary | ICD-10-CM | POA: Insufficient documentation

## 2016-05-09 DIAGNOSIS — I2510 Atherosclerotic heart disease of native coronary artery without angina pectoris: Secondary | ICD-10-CM | POA: Diagnosis not present

## 2016-05-09 DIAGNOSIS — R918 Other nonspecific abnormal finding of lung field: Secondary | ICD-10-CM | POA: Insufficient documentation

## 2016-05-09 DIAGNOSIS — R911 Solitary pulmonary nodule: Secondary | ICD-10-CM

## 2016-05-09 HISTORY — DX: Unspecified asthma, uncomplicated: J45.909

## 2016-05-09 MED ORDER — IOPAMIDOL (ISOVUE-300) INJECTION 61%
75.0000 mL | Freq: Once | INTRAVENOUS | Status: AC | PRN
  Administered 2016-05-09: 75 mL via INTRAVENOUS

## 2016-05-24 ENCOUNTER — Inpatient Hospital Stay
Admission: AD | Admit: 2016-05-24 | Discharge: 2016-05-26 | DRG: 190 | Disposition: A | Discharge status: Home / Self Care | Payer: 59 | Source: Ambulatory Visit | Attending: Internal Medicine | Admitting: Internal Medicine

## 2016-05-24 ENCOUNTER — Inpatient Hospital Stay: Payer: 59

## 2016-05-24 DIAGNOSIS — Z91030 Bee allergy status: Secondary | ICD-10-CM | POA: Diagnosis not present

## 2016-05-24 DIAGNOSIS — Z91018 Allergy to other foods: Secondary | ICD-10-CM | POA: Diagnosis not present

## 2016-05-24 DIAGNOSIS — Z91010 Allergy to peanuts: Secondary | ICD-10-CM

## 2016-05-24 DIAGNOSIS — J441 Chronic obstructive pulmonary disease with (acute) exacerbation: Secondary | ICD-10-CM | POA: Diagnosis present

## 2016-05-24 DIAGNOSIS — R0602 Shortness of breath: Secondary | ICD-10-CM | POA: Diagnosis present

## 2016-05-24 DIAGNOSIS — J9621 Acute and chronic respiratory failure with hypoxia: Secondary | ICD-10-CM | POA: Diagnosis present

## 2016-05-24 DIAGNOSIS — Z79899 Other long term (current) drug therapy: Secondary | ICD-10-CM

## 2016-05-24 DIAGNOSIS — Z9981 Dependence on supplemental oxygen: Secondary | ICD-10-CM

## 2016-05-24 DIAGNOSIS — Z7951 Long term (current) use of inhaled steroids: Secondary | ICD-10-CM | POA: Diagnosis not present

## 2016-05-24 DIAGNOSIS — Z91012 Allergy to eggs: Secondary | ICD-10-CM | POA: Diagnosis not present

## 2016-05-24 DIAGNOSIS — J9601 Acute respiratory failure with hypoxia: Secondary | ICD-10-CM | POA: Diagnosis present

## 2016-05-24 DIAGNOSIS — F17210 Nicotine dependence, cigarettes, uncomplicated: Secondary | ICD-10-CM | POA: Diagnosis present

## 2016-05-24 LAB — CBC
HCT: 40.5 % (ref 35.0–47.0)
HEMOGLOBIN: 13.7 g/dL (ref 12.0–16.0)
MCH: 30.4 pg (ref 26.0–34.0)
MCHC: 33.7 g/dL (ref 32.0–36.0)
MCV: 90.1 fL (ref 80.0–100.0)
PLATELETS: 370 10*3/uL (ref 150–440)
RBC: 4.5 MIL/uL (ref 3.80–5.20)
RDW: 13.4 % (ref 11.5–14.5)
WBC: 9.5 10*3/uL (ref 3.6–11.0)

## 2016-05-24 LAB — BASIC METABOLIC PANEL
Anion gap: 8 (ref 5–15)
BUN: 11 mg/dL (ref 6–20)
CALCIUM: 9.2 mg/dL (ref 8.9–10.3)
CO2: 33 mmol/L — ABNORMAL HIGH (ref 22–32)
CREATININE: 0.77 mg/dL (ref 0.44–1.00)
Chloride: 98 mmol/L — ABNORMAL LOW (ref 101–111)
GFR calc non Af Amer: >60 mL/min (ref >60)
Glucose, Bld: 131 mg/dL — ABNORMAL HIGH (ref 65–99)
Potassium: 3.7 mmol/L (ref 3.5–5.1)
SODIUM: 139 mmol/L (ref 135–145)

## 2016-05-24 MED ORDER — THEOPHYLLINE ER 100 MG PO CP24
100.0000 mg | ORAL_CAPSULE | Freq: Two times a day (BID) | ORAL | Status: DC
  Administered 2016-05-24 – 2016-05-25 (×2): 100 mg via ORAL
  Filled 2016-05-24 (×3): qty 1

## 2016-05-24 MED ORDER — DOCUSATE SODIUM 100 MG PO CAPS
100.0000 mg | ORAL_CAPSULE | Freq: Two times a day (BID) | ORAL | Status: DC
  Administered 2016-05-25 – 2016-05-26 (×3): 100 mg via ORAL
  Filled 2016-05-24 (×4): qty 1

## 2016-05-24 MED ORDER — METHYLPREDNISOLONE SODIUM SUCC 125 MG IJ SOLR
60.0000 mg | Freq: Four times a day (QID) | INTRAMUSCULAR | Status: DC
  Administered 2016-05-24 – 2016-05-25 (×4): 60 mg via INTRAVENOUS
  Filled 2016-05-24 (×4): qty 2

## 2016-05-24 MED ORDER — TRAZODONE 25 MG HALF TABLET
25.0000 mg | ORAL_TABLET | Freq: Every evening | ORAL | Status: DC | PRN
  Filled 2016-05-24: qty 1

## 2016-05-24 MED ORDER — THEOPHYLLINE ER 100 MG PO TB12
100.0000 mg | ORAL_TABLET | Freq: Two times a day (BID) | ORAL | Status: DC
  Filled 2016-05-24: qty 1

## 2016-05-24 MED ORDER — ONDANSETRON HCL 4 MG PO TABS: 4.0000 mg | ORAL_TABLET | Freq: Four times a day (QID) | ORAL | Status: DC | PRN

## 2016-05-24 MED ORDER — ACETAMINOPHEN 650 MG RE SUPP: 650.0000 mg | Freq: Four times a day (QID) | RECTAL | Status: DC | PRN

## 2016-05-24 MED ORDER — HEPARIN SODIUM (PORCINE) 5000 UNIT/ML IJ SOLN
5000.0000 [IU] | Freq: Three times a day (TID) | INTRAMUSCULAR | Status: DC
  Filled 2016-05-24 (×2): qty 1

## 2016-05-24 MED ORDER — ONDANSETRON HCL 4 MG/2ML IJ SOLN: 4.0000 mg | Freq: Four times a day (QID) | INTRAMUSCULAR | Status: DC | PRN

## 2016-05-24 MED ORDER — BENZONATATE 100 MG PO CAPS
100.0000 mg | ORAL_CAPSULE | Freq: Three times a day (TID) | ORAL | Status: DC | PRN
  Administered 2016-05-25 – 2016-05-26 (×2): 100 mg via ORAL
  Filled 2016-05-24 (×2): qty 1

## 2016-05-24 MED ORDER — SODIUM CHLORIDE 0.9 % IV SOLN: 60.0000 mg | Freq: Four times a day (QID) | INTRAVENOUS | Status: DC

## 2016-05-24 MED ORDER — ACETAMINOPHEN 325 MG PO TABS: 650.0000 mg | ORAL_TABLET | Freq: Four times a day (QID) | ORAL | Status: DC | PRN

## 2016-05-24 MED ORDER — IPRATROPIUM-ALBUTEROL 0.5-2.5 (3) MG/3ML IN SOLN
3.0000 mL | Freq: Four times a day (QID) | RESPIRATORY_TRACT | Status: DC
  Administered 2016-05-24 – 2016-05-26 (×8): 3 mL via RESPIRATORY_TRACT
  Filled 2016-05-24 (×9): qty 3

## 2016-05-24 MED ORDER — BISACODYL 5 MG PO TBEC: 5.0000 mg | DELAYED_RELEASE_TABLET | Freq: Every day | ORAL | Status: DC | PRN

## 2016-05-24 NOTE — H&P (Signed)
[facility] - [place] at [facility]   PATIENT NAME: Kristi Walter    MR#:  [id number]  DATE OF BIRTH:  July 14, 1960  DATE OF ADMISSION:  05/24/2016  PRIMARY CARE PHYSICIAN: Bernerd Limbo, MD   REQUESTING/REFERRING PHYSICIAN: Dr. Nickolas Madrid  CHIEF COMPLAINT:  No chief complaint on file. shortness of breath  And hypoxia.  HISTORY OF PRESENT ILLNESS:  Kristi Walter  is a 56 y.o. female with a known history of COPD on 2 L of oxygen at night comes in from Dr. Nickolas Madrid office because of shortness of breath, cough, possible COPD exacerbation. Patient used nebulizers at home every 2 hours, symptoms persisted despite treatment. Patient recently finished a course of Levaquin, prednisone. O2 saturations are currently on low 80s at doctor's office concerning that the, treatment failure as an outpatient Dr. Nickolas Madrid asked  me to admit the patient. [facility] and [facility] was negative.     PAST MEDICAL HISTORY:   Past Medical History:  Diagnosis Date  . Asthma     PAST SURGICAL HISTOIRY:  No past surgical history on file.  SOCIAL HISTORY:   Social History  Substance Use Topics  . Smoking status: Current Every Day Smoker    Packs/day: 0.25    Years: 30.00    Types: Cigarettes  . Smokeless tobacco: Never Used  . Alcohol use Not on file    FAMILY HISTORY:  No family history on file.  DRUG ALLERGIES:   Allergies  Allergen Reactions  . Eggs Or Egg-Derived Products Anaphylaxis  . Peanut Oil Anaphylaxis  . Tomato Anaphylaxis  . Venomil Honey Bee Venom [Honey Bee Venom] Anaphylaxis  . Venomil Wasp Venom [Wasp Venom] Anaphylaxis  . Venomil White [employer]aced [employer] [White Faced Hornet Venom] Anaphylaxis    REVIEW OF SYSTEMS:  CONSTITUTIONAL: No fever, fatigue or weakness.  EYES: No blurred or double vision.  EARS, NOSE, AND THROAT: No tinnitus or ear pain.  RESPIRATORY: Cough, shortness of breath, wheezing.  wheezing   CARDIOVASCULAR: No chest pain, orthopnea, edema.  GASTROINTESTINAL: No nausea, vomiting, diarrhea or abdominal pain.  GENITOURINARY: No dysuria, hematuria.  ENDOCRINE: No polyuria, nocturia,  HEMATOLOGY: No anemia, easy bruising or bleeding SKIN: No rash or lesion. MUSCULOSKELETAL: No joint pain or arthritis.   NEUROLOGIC: No tingling, numbness, weakness.  PSYCHIATRY: No anxiety or depression.   MEDICATIONS AT HOME:   Prior to Admission medications   Medication Sig Start Date End Date Taking? Authorizing Provider  albuterol (PROAIR HFA) 108 (90 Base) MCG/ACT inhaler  04/26/14   Historical Provider, MD  benzonatate (TESSALON) 100 MG capsule Take 100 mg by mouth 3 (three) times daily as needed for cough.    Historical Provider, MD  budesonide-formoterol [facility]) 160-4.5 MCG/ACT inhaler  04/26/14   Historical Provider, MD  EPINEPHrine 0.3 mg/0.3 mL IJ SOAJ injection Inject into the muscle. 03/06/13   Historical Provider, MD  fluticasone Aleda Grana) 50 MCG/ACT nasal spray  04/26/14   Historical Provider, MD  ipratropium-albuterol (DUONEB) 0.5-2.5 (3) MG/3ML SOLN  04/26/14   Historical Provider, MD  omeprazole (PRILOSEC) 20 MG capsule Take by mouth. 04/26/14   Historical Provider, MD  theophylline (THEO-24) 100 MG 24 hr capsule Take by mouth. 04/26/14   Historical Provider, MD  tiotropium (SPIRIVA) 18 MCG inhalation capsule  04/26/14   Historical Provider, MD      VITAL SIGNS:  Blood pressure 118/63, pulse 89, temperature 97.5 F (36.4 C), temperature source Oral, resp. rate 16,  SpO2 93 %.  PHYSICAL EXAMINATION:  GENERAL:  56 y.o.-year-old patient lying in the bed with no acute distress.  EYES: Pupils equal, round, reactive to light accommodation. No scleral icterus. Extraocular muscles intact.  HEENT: Head atraumatic, normocephalic. Oropharynx and nasopharynx clear.  NECK:  Supple, no jugular venous distention. No thyroid enlargement, no tenderness.  LUNGS: Faint expiratory wheeze in all  lung fields.no rales,rhonchi or crepitation. No use of accessory muscles of respiration.  CARDIOVASCULAR: S1, S2 normal. No murmurs, rubs, or gallops.  ABDOMEN: Soft, nontender, nondistended. Bowel sounds present. No organomegaly or mass.  EXTREMITIES: No pedal edema, cyanosis, or clubbing.  NEUROLOGIC: Cranial nerves II through XII are intact. Muscle strength 5/5 in all extremities. Sensation intact. Gait not checked.  PSYCHIATRIC: The patient is alert and oriented x 3.  SKIN: No obvious rash, lesion, or ulcer.   LABORATORY PANEL:   CBC  Recent Labs Lab 05/24/16 1711  WBC 9.5  HGB 13.7  HCT 40.5  PLT 370   ------------------------------------------------------------------------------------------------------------------  Chemistries   Recent Labs Lab 05/24/16 1711  NA 139  K 3.7  CL 98*  CO2 33*  GLUCOSE 131*  BUN 11  CREATININE 0.77  CALCIUM 9.2   ------------------------------------------------------------------------------------------------------------------  Cardiac Enzymes No results for input(s): TROPONINI in the last 168 hours. ------------------------------------------------------------------------------------------------------------------  RADIOLOGY:  X-ray Chest Pa And Lateral  Result Date: 05/24/2016 CLINICAL DATA:  Shortness of breath and flu-like symptoms EXAM: CHEST  2 VIEW COMPARISON:  Chest CT 05/09/2016 FINDINGS: The lungs are well inflated. Cardiomediastinal contours are normal. No pneumothorax or pleural effusion. No focal airspace consolidation or pulmonary edema. IMPRESSION: No active cardiopulmonary disease. Electronically Signed   By: Deatra RobinsonKevin  Herman M.D.   On: 05/24/2016 16:06    EKG:   Orders placed or performed in visit on 02/25/14  . EKG 12-Lead    IMPRESSION AND PLAN:   #1.acute  on chronic respiratory failure due to COPD exacerbation: failed out pt therapy. patient needing oxygen all are daytime, patient usually uses oxygen at night  only. She is on 2 L saturation 93%.. Continue nebulizers, small dose steroids, Pulmicort nebulizers, see how she does.order theophylline,.  #2 tobacco abuse ;counseled to quit for about 10 minutes.  All the records are reviewed and case discussed with ED provider. Management plans discussed with the patient, family and they are in agreement.  CODE STATUS: full  TOTAL TIME TAKING CARE OF THIS PATIENT: 55 min minutes.    Katha HammingKONIDENA,Jalynne Persico M.D on 05/24/2016 at 5:46 PM  Between 7am to 6pm - Pager - [phone]  After 6pm go to [email or web address] - password [email or web address] [facility]  [facility]  Office  [phone]  CC: Primary care physician; Bernerd LimboKoonce, Thomas F, MD  Note: This dictation was prepared with Dragon dictation along with smaller phrase technology. Any transcriptional errors that result from this process are unintentional.

## 2016-05-24 NOTE — Plan of Care (Signed)
Problem: Education: Goal: Knowledge of [facility] Education information/materials will improve Outcome: Progressing Oriented patient to room features, unit routines, meal service, and plan of care thus far.

## 2016-05-25 LAB — GLUCOSE, CAPILLARY: GLUCOSE-CAPILLARY: 132 mg/dL — AB (ref 65–99)

## 2016-05-25 LAB — BASIC METABOLIC PANEL
Anion gap: 6 (ref 5–15)
BUN: 11 mg/dL (ref 6–20)
CALCIUM: 9.1 mg/dL (ref 8.9–10.3)
CHLORIDE: 100 mmol/L — AB (ref 101–111)
CO2: 33 mmol/L — AB (ref 22–32)
CREATININE: 0.67 mg/dL (ref 0.44–1.00)
GFR calc Af Amer: >60 mL/min (ref >60)
GFR calc non Af Amer: >60 mL/min (ref >60)
GLUCOSE: 153 mg/dL — AB (ref 65–99)
Potassium: 4.2 mmol/L (ref 3.5–5.1)
Sodium: 139 mmol/L (ref 135–145)

## 2016-05-25 LAB — CBC
HCT: 38 % (ref 35.0–47.0)
Hemoglobin: 13 g/dL (ref 12.0–16.0)
MCH: 30.5 pg (ref 26.0–34.0)
MCHC: 34.1 g/dL (ref 32.0–36.0)
MCV: 89.5 fL (ref 80.0–100.0)
PLATELETS: 376 10*3/uL (ref 150–440)
RBC: 4.25 MIL/uL (ref 3.80–5.20)
RDW: 13.4 % (ref 11.5–14.5)
WBC: 6.5 10*3/uL (ref 3.6–11.0)

## 2016-05-25 MED ORDER — SODIUM CHLORIDE 0.9% FLUSH
3.0000 mL | Freq: Two times a day (BID) | INTRAVENOUS | Status: DC
  Administered 2016-05-25 – 2016-05-26 (×3): 3 mL via INTRAVENOUS

## 2016-05-25 MED ORDER — SODIUM CHLORIDE 0.9% FLUSH
3.0000 mL | INTRAVENOUS | Status: DC | PRN
  Administered 2016-05-25: 3 mL via INTRAVENOUS

## 2016-05-25 MED ORDER — MOMETASONE FURO-FORMOTEROL FUM 100-5 MCG/ACT IN AERO
2.0000 | INHALATION_SPRAY | Freq: Two times a day (BID) | RESPIRATORY_TRACT | Status: DC
  Administered 2016-05-25 – 2016-05-26 (×3): 2 via RESPIRATORY_TRACT
  Filled 2016-05-25: qty 8.8

## 2016-05-25 MED ORDER — ENOXAPARIN SODIUM 40 MG/0.4ML ~~LOC~~ SOLN
40.0000 mg | SUBCUTANEOUS | Status: DC
  Filled 2016-05-25: qty 0.4

## 2016-05-25 MED ORDER — METHYLPREDNISOLONE SODIUM SUCC 125 MG IJ SOLR
60.0000 mg | Freq: Two times a day (BID) | INTRAMUSCULAR | Status: DC
  Administered 2016-05-25: 60 mg via INTRAVENOUS
  Filled 2016-05-25: qty 2

## 2016-05-25 MED ORDER — HYDROCOD POLST-CPM POLST ER 10-8 MG/5ML PO SUER
ORAL | Status: AC
Start: 2016-05-25 — End: 2016-05-25
  Administered 2016-05-25: 5 mL
  Filled 2016-05-25: qty 5

## 2016-05-25 MED ORDER — GUAIFENESIN ER 600 MG PO TB12
600.0000 mg | ORAL_TABLET | Freq: Two times a day (BID) | ORAL | Status: DC
  Administered 2016-05-25 – 2016-05-26 (×3): 600 mg via ORAL
  Filled 2016-05-25 (×3): qty 1

## 2016-05-25 MED ORDER — GUAIFENESIN 100 MG/5ML PO SOLN
5.0000 mL | ORAL | Status: DC | PRN
  Administered 2016-05-25: 100 mg via ORAL
  Filled 2016-05-25 (×2): qty 5

## 2016-05-25 MED ORDER — HYDROCOD POLST-CPM POLST ER 10-8 MG/5ML PO SUER
5.0000 mL | Freq: Two times a day (BID) | ORAL | Status: DC | PRN
  Administered 2016-05-26: 5 mL via ORAL
  Filled 2016-05-25: qty 5

## 2016-05-25 MED ORDER — MONTELUKAST SODIUM 10 MG PO TABS
10.0000 mg | ORAL_TABLET | Freq: Every day | ORAL | Status: DC
  Administered 2016-05-25: 10 mg via ORAL
  Filled 2016-05-25: qty 1

## 2016-05-25 MED ORDER — THEOPHYLLINE ER 100 MG PO CP24
100.0000 mg | ORAL_CAPSULE | Freq: Every day | ORAL | Status: DC
  Administered 2016-05-26: 100 mg via ORAL
  Filled 2016-05-25: qty 1

## 2016-05-25 MED ORDER — TIOTROPIUM BROMIDE MONOHYDRATE 18 MCG IN CAPS
18.0000 ug | ORAL_CAPSULE | Freq: Every day | RESPIRATORY_TRACT | Status: DC
  Administered 2016-05-25 – 2016-05-26 (×2): 18 ug via RESPIRATORY_TRACT
  Filled 2016-05-25: qty 5

## 2016-05-25 NOTE — Progress Notes (Signed)
[facility] [place] at [facility]   PATIENT NAME: Kristi Walter    MR#:  [id number]  DATE OF BIRTH:  16-Aug-1960  SUBJECTIVE:  CHIEF COMPLAINT:  No chief complaint on file.  Better shortness of breath and cough, still has wheezing. On O2 [place] 2L. REVIEW OF SYSTEMS:  Review of Systems  Constitutional: Negative for chills, fever and malaise/fatigue.  HENT: Negative for congestion.   Eyes: Negative for blurred vision and double vision.  Respiratory: Positive for cough, shortness of breath and wheezing. Negative for hemoptysis, sputum production and stridor.   Cardiovascular: Negative for chest pain and leg swelling.  Gastrointestinal: Negative for abdominal pain, blood in stool, constipation, diarrhea, melena, nausea and vomiting.  Genitourinary: Negative for dysuria and hematuria.  Musculoskeletal: Negative for back pain.  Neurological: Negative for dizziness, focal weakness, loss of consciousness, weakness and headaches.  Psychiatric/Behavioral: Negative for depression. The patient is not nervous/anxious.     DRUG ALLERGIES:   Allergies  Allergen Reactions  . Eggs Or Egg-Derived Products Anaphylaxis  . Peanut Oil Anaphylaxis  . Tomato Anaphylaxis  . Venomil Honey Bee Venom [Honey Bee Venom] Anaphylaxis  . Venomil Wasp Venom [Wasp Venom] Anaphylaxis  . Venomil White Faced [employer] [White Faced Hornet Venom] Anaphylaxis   VITALS:  Blood pressure 131/63, pulse 83, temperature 97.9 F (36.6 C), temperature source Oral, resp. rate 18, height 5' 2.5" (1.588 m), weight 144 lb 14.4 oz (65.7 kg), SpO2 98 %. PHYSICAL EXAMINATION:  Physical Exam  Constitutional: She is oriented to person, place, and time and well-developed, well-nourished, and in no distress.  HENT:  Head: Normocephalic.  Mouth/Throat: Oropharynx is clear and moist.  Eyes: Conjunctivae and EOM are normal. No scleral icterus.  Neck: Normal range of motion. Neck supple. No JVD present. No tracheal  deviation present.  Cardiovascular: Normal rate, regular rhythm and normal heart sounds.  Exam reveals no gallop.   No murmur heard. Pulmonary/Chest: Effort normal. No respiratory distress. She has wheezes.  Abdominal: Soft. Bowel sounds are normal. She exhibits no distension. There is no tenderness.  Musculoskeletal: Normal range of motion. She exhibits no edema or tenderness.  Neurological: She is alert and oriented to person, place, and time. No cranial nerve deficit.  Skin: No rash noted. No erythema.  Psychiatric: Affect and judgment normal.   LABORATORY PANEL:   CBC  Recent Labs Lab 05/25/16 0440  WBC 6.5  HGB 13.0  HCT 38.0  PLT 376   ------------------------------------------------------------------------------------------------------------------ Chemistries   Recent Labs Lab 05/25/16 0440  NA 139  K 4.2  CL 100*  CO2 33*  GLUCOSE 153*  BUN 11  CREATININE 0.67  CALCIUM 9.1   RADIOLOGY:  X-ray Chest Pa And Lateral  Result Date: 05/24/2016 CLINICAL DATA:  Shortness of breath and flu-like symptoms EXAM: CHEST  2 VIEW COMPARISON:  Chest CT 05/09/2016 FINDINGS: The lungs are well inflated. Cardiomediastinal contours are normal. No pneumothorax or pleural effusion. No focal airspace consolidation or pulmonary edema. IMPRESSION: No active cardiopulmonary disease. Electronically Signed   By: Deatra RobinsonKevin  Herman M.D.   On: 05/24/2016 16:06   ASSESSMENT AND PLAN:   #1.acute  on chronic respiratory failure due to COPD exacerbation: failed out pt therapy. Try to wean off O2 [place], she uses oxygen at night only.  Continue nebulizers, taper steroids, Pulmicort nebulizers and theophylline,.  #2 tobacco abuse ;counseled to quit.  All the records are reviewed and case discussed with Care Management/Social Worker. Management plans discussed with the patient, family and  they are in agreement.  CODE STATUS: Full code  TOTAL TIME TAKING CARE OF THIS PATIENT: 33 minutes.   More than  50% of the time was spent in counseling/coordination of care: YES  POSSIBLE D/C IN 2 DAYS, DEPENDING ON CLINICAL CONDITION.   Shaune Pollack M.D on 05/25/2016 at 3:22 PM  Between 7am to 6pm - Pager - [phone]  After 6pm go to [email or web address] - Social research officer, government  [facility] [place] [facility]  Office  [phone]  CC: Primary care physician; Bernerd Limbo, MD  Note: This dictation was prepared with Dragon dictation along with smaller phrase technology. Any transcriptional errors that result from this process are unintentional.

## 2016-05-25 NOTE — Progress Notes (Signed)
Initial Nutrition Assessment  DOCUMENTATION CODES:   Not applicable  INTERVENTION:  - Snacks TID - Encouraging increased protein intake as per current diet recall is inadequate - Recommend liberalization of diet to Regular to increase po intake - Pt may benefit from Multi-vitamin  NUTRITION DIAGNOSIS:   Inadequate oral intake related to poor appetite as evidenced by per patient/family report.  GOAL:   Patient will meet greater than or equal to 90% of their needs  MONITOR:   PO intake, Weight trends, I & O's  REASON FOR ASSESSMENT:   Consult Assessment of nutrition requirement/status (weight loss)  ASSESSMENT:   56 y.o. Female with PMH of COPD, GERD, and food allergies presenting with acute respiratory failure with hypoxemia r/t possible COPD exacerbation   Pt reports recent stable weight. Pt reported losing 35 lbs in 6 months during sickness of late husband from March 2017  to September 2017 (20% in 6 mo). Pt states her weight has been stable ~145 lb since September 2017. Per chart review pt currently weights 144 lbs. No extensive weight history recorded.  Pt states she eats well when on steroids and fair when not. Noted pt on steroids at present. Only recent recorded meal completion was 90% yesterday at 1911. Pt states she could not have bacon this am d/t heart healthy diet.   Pt states PTA she consumes one meal per day consisting of a meat with two vegetables, but frequently snacks on 3-4 fruits, cheese and crackers, water and 20 oz of Mt. Dew per day.  Pt states she does not like Ensure or nutritional supplements but is willing to snack if provided, stating her sons have been bringing food from home and asked nurses for graham crackers last night. Will add snacks to support po intake.   Per nutrition focused physical exam pt showed no muscle depletion, no fat depletion and no edema.   Labs reviewed Medications reviewed; 60 mg Solu-medrol, Ducolax, Colace  Diet Order:   Diet Heart Room service appropriate? Yes; Fluid consistency: Thin  Skin:  Reviewed, no issues  Last BM:  2/9  Height:   Ht Readings from Last 1 Encounters:  05/24/16 5' 2.5" (1.588 m)   Weight:   Wt Readings from Last 1 Encounters:  05/25/16 144 lb 14.4 oz (65.7 kg)   Ideal Body Weight:  51 kg  BMI:  Body mass index is 26.08 kg/m.  Estimated Nutritional Needs:   Kcal:  1650-1850  Protein:  80-90  Fluid:  >/= 1.6 L /d  EDUCATION NEEDS:   Education needs no appropriate at this time  Fransisca KaufmannAllison Ioannides Dietetic Intern

## 2016-05-25 NOTE — Plan of Care (Signed)
Problem: Activity: Goal: Ability to tolerate increased activity will improve Outcome: Progressing Pt able to tolerate exertion to bathroom with minimal exertion on 2LNC.

## 2016-05-25 NOTE — Care Management (Signed)
CM consult ordered.  Patient presents from home.  Chronic home 02 through [employer]. Says she caught  a cold and just could not shake it.    Independent in all adls, denies issues accessing medical care, obtaining medications or with transportation.  Current with her PCP.  No discharge needs identified at present by care manager or members of care team.

## 2016-05-25 NOTE — Plan of Care (Signed)
Problem: Health Behavior/Discharge Planning: Goal: Ability to manage health-related needs will improve Outcome: Progressing Smoking cessation information given. Organize with case management on patient going home on O2.  Problem: Respiratory: Goal: Ability to maintain a clear airway will improve Outcome: Progressing Patient educated on increasing fluids to help thin secretions. Coughing and deep breathing encouraged.

## 2016-05-26 LAB — GLUCOSE, CAPILLARY: GLUCOSE-CAPILLARY: 118 mg/dL — AB (ref 65–99)

## 2016-05-26 MED ORDER — PREDNISONE 50 MG PO TABS
50.0000 mg | ORAL_TABLET | Freq: Every day | ORAL | Status: DC
  Administered 2016-05-26: 50 mg via ORAL
  Filled 2016-05-26: qty 1

## 2016-05-26 MED ORDER — GUAIFENESIN 100 MG/5ML PO SOLN: 5.0000 mL | ORAL | 0 refills | Status: DC | PRN

## 2016-05-26 MED ORDER — PREDNISONE 10 MG PO TABS: ORAL_TABLET | ORAL | 0 refills | Status: DC

## 2016-05-26 MED ORDER — IPRATROPIUM-ALBUTEROL 0.5-2.5 (3) MG/3ML IN SOLN: 3.0000 mL | RESPIRATORY_TRACT | Status: DC | PRN

## 2016-05-26 NOTE — Discharge Instructions (Signed)
Guaifenesin oral ER tablets What is this medicine? GUAIFENESIN (gwye FEN e sin) is an expectorant. It helps to thin mucous and make coughs more productive. This medicine is used to treat coughs caused by colds or the flu. It is not intended to treat chronic cough caused by smoking, asthma, emphysema, or heart failure. This medicine may be used for other purposes; ask your health care provider or pharmacist if you have questions. COMMON BRAND NAME(S): Humibid, Mucinex What should I tell my health care provider before I take this medicine? They need to know if you have any of these conditions: -fever -kidney disease -an unusual or allergic reaction to guaifenesin, other medicines, foods, dyes, or preservatives -pregnant or trying to get pregnant -breast-feeding How should I use this medicine? Take this medicine by mouth with a full glass of water. Follow the directions on the prescription label. Do not break, chew or crush this medicine. You may take with food or on an empty stomach. Take your medicine at regular intervals. Do not take your medicine more often than directed. Talk to your pediatrician regarding the use of this medicine in children. While this drug may be prescribed for children as young as 27 years old for selected conditions, precautions do apply. Overdosage: If you think you have taken too much of this medicine contact a poison control center or emergency room at once. NOTE: This medicine is only for you. Do not share this medicine with others. What if I miss a dose? If you miss a dose, take it as soon as you can. If it is almost time for your next dose, take only that dose. Do not take double or extra doses. What may interact with this medicine? Interactions are not expected. This list may not describe all possible interactions. Give your health care provider a list of all the medicines, herbs, non-prescription drugs, or dietary supplements you use. Also tell them if you smoke,  drink alcohol, or use illegal drugs. Some items may interact with your medicine. What should I watch for while using this medicine? Do not treat a cough for more than 1 week without consulting your doctor or health care professional. If you also have a high fever, skin rash, continuing headache, or sore throat, see your doctor. For best results, drink 6 to 8 glasses water daily while you are taking this medicine. What side effects may I notice from receiving this medicine? Side effects that you should report to your doctor or health care professional as soon as possible: -allergic reactions like skin rash, itching or hives, swelling of the face, lips, or tongue Side effects that usually do not require medical attention (report to your doctor or health care professional if they continue or are bothersome): -dizziness -headache -stomach upset This list may not describe all possible side effects. Call your doctor for medical advice about side effects. You may report side effects to FDA at 1-800-FDA-1088. Where should I keep my medicine? Keep out of the reach of children. Store at room temperature between 20 and 25 degrees C (68 and 77 degrees F). Keep container tightly closed. Throw away any unused medicine after the expiration date. NOTE: This sheet is a summary. It may not cover all possible information. If you have questions about this medicine, talk to your doctor, pharmacist, or health care provider.  2017 Elsevier/Gold Standard (2007-08-13 12:14:14) Prednisone tablets What is this medicine? PREDNISONE (PRED ni sone) is a corticosteroid. It is commonly used to treat inflammation of the  skin, joints, lungs, and other organs. Common conditions treated include asthma, allergies, and arthritis. It is also used for other conditions, such as blood disorders and diseases of the adrenal glands. This medicine may be used for other purposes; ask your health care provider or pharmacist if you have  questions. COMMON BRAND NAME(S): Deltasone, Predone, Sterapred, Sterapred DS What should I tell my health care provider before I take this medicine? They need to know if you have any of these conditions: -Cushing's syndrome -diabetes -glaucoma -heart disease -high blood pressure -infection (especially a virus infection such as chickenpox, cold sores, or herpes) -kidney disease -liver disease -mental illness -myasthenia gravis -osteoporosis -seizures -stomach or intestine problems -thyroid disease -an unusual or allergic reaction to lactose, prednisone, other medicines, foods, dyes, or preservatives -pregnant or trying to get pregnant -breast-feeding How should I use this medicine? Take this medicine by mouth with a glass of water. Follow the directions on the prescription label. Take this medicine with food. If you are taking this medicine once a day, take it in the morning. Do not take more medicine than you are told to take. Do not suddenly stop taking your medicine because you may develop a severe reaction. Your doctor will tell you how much medicine to take. If your doctor wants you to stop the medicine, the dose may be slowly lowered over time to avoid any side effects. Talk to your pediatrician regarding the use of this medicine in children. Special care may be needed. Overdosage: If you think you have taken too much of this medicine contact a poison control center or emergency room at once. NOTE: This medicine is only for you. Do not share this medicine with others. What if I miss a dose? If you miss a dose, take it as soon as you can. If it is almost time for your next dose, talk to your doctor or health care professional. You may need to miss a dose or take an extra dose. Do not take double or extra doses without advice. What may interact with this medicine? Do not take this medicine with any of the following medications: -metyrapone -mifepristone This medicine may also  interact with the following medications: -aminoglutethimide -amphotericin B -aspirin and aspirin-like medicines -barbiturates -certain medicines for diabetes, like glipizide or glyburide -cholestyramine -cholinesterase inhibitors -cyclosporine -digoxin -diuretics -ephedrine -female hormones, like estrogens and birth control pills -isoniazid -ketoconazole -NSAIDS, medicines for pain and inflammation, like ibuprofen or naproxen -phenytoin -rifampin -toxoids -vaccines -warfarin This list may not describe all possible interactions. Give your health care provider a list of all the medicines, herbs, non-prescription drugs, or dietary supplements you use. Also tell them if you smoke, drink alcohol, or use illegal drugs. Some items may interact with your medicine. What should I watch for while using this medicine? Visit your doctor or health care professional for regular checks on your progress. If you are taking this medicine over a prolonged period, carry an identification card with your name and address, the type and dose of your medicine, and your doctor's name and address. This medicine may increase your risk of getting an infection. Tell your doctor or health care professional if you are around anyone with measles or chickenpox, or if you develop sores or blisters that do not heal properly. If you are going to have surgery, tell your doctor or health care professional that you have taken this medicine within the last twelve months. Ask your doctor or health care professional about your diet. You  may need to lower the amount of salt you eat. This medicine may affect blood sugar levels. If you have diabetes, check with your doctor or health care professional before you change your diet or the dose of your diabetic medicine. What side effects may I notice from receiving this medicine? Side effects that you should report to your doctor or health care professional as soon as  possible: -allergic reactions like skin rash, itching or hives, swelling of the face, lips, or tongue -changes in emotions or moods -changes in vision -depressed mood -eye pain -fever or chills, cough, sore throat, pain or difficulty passing urine -increased thirst -swelling of ankles, feet Side effects that usually do not require medical attention (report to your doctor or health care professional if they continue or are bothersome): -confusion, excitement, restlessness -headache -nausea, vomiting -skin problems, acne, thin and shiny skin -trouble sleeping -weight gain This list may not describe all possible side effects. Call your doctor for medical advice about side effects. You may report side effects to FDA at 1-800-FDA-1088. Where should I keep my medicine? Keep out of the reach of children. Store at room temperature between 15 and 30 degrees C (59 and 86 degrees F). Protect from light. Keep container tightly closed. Throw away any unused medicine after the expiration date. NOTE: This sheet is a summary. It may not cover all possible information. If you have questions about this medicine, talk to your doctor, pharmacist, or health care provider.  2017 Elsevier/Gold Standard (2010-11-16 10:57:14)   Shortness of Breath, Adult Shortness of breath is when a person has trouble breathing enough air, or when a person feels like she or he is having trouble breathing in enough air. Shortness of breath could be a sign of medical problem. Follow these instructions at home: Pay attention to any changes in your symptoms. Take these actions to help with your condition:  Do not smoke. Smoking is a common cause of shortness of breath. If you smoke and you need help quitting, ask your health care provider.  Avoid things that can irritate your airways, such as:  Mold.  Dust.  Air pollution.  Chemical fumes.  Things that can cause allergy symptoms (allergens), if you have  allergies.  Keep your living space clean and free of mold and dust.  Rest as needed. Slowly return to your usual activities.  Take over-the-counter and prescription medicines, including oxygen and inhaled medicines, only as told by your health care provider.  Keep all follow-up visits as told by your health care provider. This is important. Contact a health care provider if:  Your condition does not improve as soon as expected.  You have a hard time doing your normal activities, even after you rest.  You have new symptoms. Get help right away if:  Your shortness of breath gets worse.  You have shortness of breath when you are resting.  You feel light-headed or you faint.  You have a cough that is not controlled with medicines.  You cough up blood.  You have pain with breathing.  You have pain in your chest, arms, shoulders, or abdomen.  You have a fever.  You cannot walk up stairs or exercise the way that you normally do. This information is not intended to replace advice given to you by your health care provider. Make sure you discuss any questions you have with your health care provider. Document Released: 12/26/2000 Document Revised: 10/22/2015 Document Reviewed: 09/08/2015 Elsevier Interactive Patient Education  2017 Elsevier  Inc. Steps to Quit Smoking Smoking tobacco can be bad for your health. It can also affect almost every organ in your body. Smoking puts you and people around you at risk for many serious long-lasting (chronic) diseases. Quitting smoking is hard, but it is one of the best things that you can do for your health. It is never too late to quit. What are the benefits of quitting smoking? When you quit smoking, you lower your risk for getting serious diseases and conditions. They can include:  Lung cancer or lung disease.  Heart disease.  Stroke.  Heart attack.  Not being able to have children (infertility).  Weak bones (osteoporosis) and  broken bones (fractures). If you have coughing, wheezing, and shortness of breath, those symptoms may get better when you quit. You may also get sick less often. If you are pregnant, quitting smoking can help to lower your chances of having a baby of low birth weight. What can I do to help me quit smoking? Talk with your doctor about what can help you quit smoking. Some things you can do (strategies) include:  Quitting smoking totally, instead of slowly cutting back how much you smoke over a period of time.  Going to in-person counseling. You are more likely to quit if you go to many counseling sessions.  Using resources and support systems, such as:  Online chats with a Veterinary surgeoncounselor.  Phone quitlines.  Printed Materials engineerself-help materials.  Support groups or group counseling.  Text messaging programs.  Mobile phone apps or applications.  Taking medicines. Some of these medicines may have nicotine in them. If you are pregnant or breastfeeding, do not take any medicines to quit smoking unless your doctor says it is okay. Talk with your doctor about counseling or other things that can help you. Talk with your doctor about using more than one strategy at the same time, such as taking medicines while you are also going to in-person counseling. This can help make quitting easier. What things can I do to make it easier to quit? Quitting smoking might feel very hard at first, but there is a lot that you can do to make it easier. Take these steps:  Talk to your family and friends. Ask them to support and encourage you.  Call phone quitlines, reach out to support groups, or work with a Veterinary surgeoncounselor.  Ask people who smoke to not smoke around you.  Avoid places that make you want (trigger) to smoke, such as:  Bars.  Parties.  Smoke-break areas at work.  Spend time with people who do not smoke.  Lower the stress in your life. Stress can make you want to smoke. Try these things to help your  stress:  Getting regular exercise.  Deep-breathing exercises.  Yoga.  Meditating.  Doing a body scan. To do this, close your eyes, focus on one area of your body at a time from head to toe, and notice which parts of your body are tense. Try to relax the muscles in those areas.  Download or buy apps on your mobile phone or tablet that can help you stick to your quit plan. There are many free apps, such as QuitGuide from the [employer] developer(Centers for Disease Control and Prevention). You can find more support from smokefree.gov and other websites. This information is not intended to replace advice given to you by your health care provider. Make sure you discuss any questions you have with your health care provider. Document Released: 01/27/2009 Document Revised:  11/29/2015 Document Reviewed: 08/17/2014 Elsevier Interactive Patient Education  2017 [employer]. Smoking cessation.

## 2016-05-26 NOTE — Discharge Summary (Signed)
[facility] [place] [facility]   PATIENT NAME: Kristi Walter    MR#:  [id number]  DATE OF BIRTH:  Aug 10, 1960  DATE OF ADMISSION:  05/24/2016   ADMITTING PHYSICIAN: Katha Hamming, MD  DATE OF DISCHARGE: 05/26/2016 10:41 AM  PRIMARY CARE PHYSICIAN: Bernerd Limbo, MD   ADMISSION DIAGNOSIS:  acure resp failure DISCHARGE DIAGNOSIS:  Active Problems:   Acute respiratory failure with hypoxemia (HCC) acute on chronic respiratory failure due to COPD exacerbation SECONDARY DIAGNOSIS:   Past Medical History:  Diagnosis Date  . Asthma    HOSPITAL COURSE:  #1.acute on chronic respiratory failure due to COPD exacerbation: failed out pt therapy.  has cough but weaned off O2 [place], she uses oxygen at night only.  Continue nebulizers, taper steroids, Pulmicort nebulizers and theophylline,.  #2 tobacco abuse ;counseled to quit.  DISCHARGE CONDITIONS:  Stable, discharge to home today. CONSULTS OBTAINED:   DRUG ALLERGIES:   Allergies  Allergen Reactions  . Eggs Or Egg-Derived Products Anaphylaxis  . Peanut Oil Anaphylaxis  . Tomato Anaphylaxis  . Venomil Honey Bee Venom [Honey Bee Venom] Anaphylaxis  . Venomil Wasp Venom [Wasp Venom] Anaphylaxis  . Venomil White Faced [employer] [White Faced Hornet Venom] Anaphylaxis   DISCHARGE MEDICATIONS:   Allergies as of 05/26/2016      Reactions   Eggs Or Egg-derived Products Anaphylaxis   Peanut Oil Anaphylaxis   Tomato Anaphylaxis   Venomil Honey Bee Venom [honey Bee Venom] Anaphylaxis   Venomil Wasp Venom [wasp Venom] Anaphylaxis   Venomil White Faced Hornet [white Faced Hornet Venom] Anaphylaxis      Medication List    TAKE these medications   benzonatate 100 MG capsule Commonly known as:  TESSALON Take 100 mg by mouth 3 (three) times daily as needed for cough.   EPIPEN 2-PAK 0.3 mg/0.3 mL Soaj injection Generic drug:  EPINEPHrine Inject 0.3 mg into the muscle once.   guaiFENesin 100 MG/5ML  Soln Commonly known as:  ROBITUSSIN Take 5 mLs (100 mg total) by mouth every 4 (four) hours as needed for cough or to loosen phlegm.   ipratropium-albuterol 0.5-2.5 (3) MG/3ML Soln Commonly known as:  DUONEB Take 3 mLs by nebulization every 6 (six) hours as needed.   montelukast 10 MG tablet Commonly known as:  SINGULAIR Take 10 mg by mouth daily.   omeprazole 20 MG capsule Commonly known as:  PRILOSEC Take 20 mg by mouth daily.   predniSONE 10 MG tablet Commonly known as:  DELTASONE 40 mg po daily for 2 days, 20 mg po daily for 2 days, 10 mg po daily for 2 days. Start taking on:  05/27/2016   PROAIR HFA 108 (90 Base) MCG/ACT inhaler Generic drug:  albuterol Inhale 1-2 puffs into the lungs every 4 (four) hours as needed.   SYMBICORT 160-4.5 MCG/ACT inhaler Generic drug:  budesonide-formoterol Inhale 2 puffs into the lungs 2 (two) times daily.   theophylline 100 MG 24 hr capsule Commonly known as:  THEO-24 Take 100 mg by mouth daily.   tiotropium 18 MCG inhalation capsule Commonly known as:  SPIRIVA Place 18 mcg into inhaler and inhale daily.        DISCHARGE INSTRUCTIONS:  See AVS.  If you experience worsening of your admission symptoms, develop shortness of breath, life threatening emergency, suicidal or homicidal thoughts you must seek medical attention immediately by calling 911 or calling your MD immediately  if symptoms less severe.  You Must read complete instructions/literature along with  all the possible adverse reactions/side effects for all the Medicines you take and that have been prescribed to you. Take any new Medicines after you have completely understood and accpet all the possible adverse reactions/side effects.   Please note  You were cared for by a hospitalist during your hospital stay. If you have any questions about your discharge medications or the care you received while you were in the hospital after you are discharged, you can call the unit and  asked to speak with the hospitalist on call if the hospitalist that took care of you is not available. Once you are discharged, your primary care physician will handle any further medical issues. Please note that NO REFILLS for any discharge medications will be authorized once you are discharged, as it is imperative that you return to your primary care physician (or establish a relationship with a primary care physician if you do not have one) for your aftercare needs so that they can reassess your need for medications and monitor your lab values.    On the day of Discharge:  VITAL SIGNS:  Blood pressure 123/60, pulse 74, temperature 97.6 F (36.4 C), resp. rate 18, height 5' 2.5" (1.588 m), weight 147 lb 1.6 oz (66.7 kg), SpO2 93 %. PHYSICAL EXAMINATION:  GENERAL:  56 y.o.-year-old patient lying in the bed with no acute distress.  EYES: Pupils equal, round, reactive to light and accommodation. No scleral icterus. Extraocular muscles intact.  HEENT: Head atraumatic, normocephalic. Oropharynx and nasopharynx clear.  NECK:  Supple, no jugular venous distention. No thyroid enlargement, no tenderness.  LUNGS: Normal breath sounds bilaterally, mild wheezing, no rales,rhonchi or crepitation. No use of accessory muscles of respiration.  CARDIOVASCULAR: S1, S2 normal. No murmurs, rubs, or gallops.  ABDOMEN: Soft, non-tender, non-distended. Bowel sounds present. No organomegaly or mass.  EXTREMITIES: No pedal edema, cyanosis, or clubbing.  NEUROLOGIC: Cranial nerves II through XII are intact. Muscle strength 5/5 in all extremities. Sensation intact. Gait not checked.  PSYCHIATRIC: The patient is alert and oriented x 3.  SKIN: No obvious rash, lesion, or ulcer.  DATA REVIEW:   CBC  Recent Labs Lab 05/25/16 0440  WBC 6.5  HGB 13.0  HCT 38.0  PLT 376    Chemistries   Recent Labs Lab 05/25/16 0440  NA 139  K 4.2  CL 100*  CO2 33*  GLUCOSE 153*  BUN 11  CREATININE 0.67  CALCIUM 9.1      Microbiology Results  Results for orders placed or performed in visit on 03/31/12  Culture, blood (single)     Status: None   Collection Time: 03/31/12  5:10 AM  Result Value Ref Range Status   Micro Text Report   Final       COMMENT                   NO GROWTH AEROBICALLY/ANAEROBICALLY IN 5 DAYS   ANTIBIOTIC                                                      Culture, blood (single)     Status: None   Collection Time: 03/31/12  5:37 AM  Result Value Ref Range Status   Micro Text Report   Final       COMMENT  NO GROWTH AEROBICALLY/ANAEROBICALLY IN 5 DAYS   ANTIBIOTIC                                                        RADIOLOGY:  No results found.   Management plans discussed with the patient, family and they are in agreement.  CODE STATUS:  Code Status History    Date Active Date Inactive Code Status Order ID Comments User Context   05/24/2016  1:16 PM 05/24/2016  3:25 PM Full Code [id number]  Katha HammingSnehalatha Konidena, MD Inpatient      TOTAL TIME TAKING CARE OF THIS PATIENT: 32 minutes.    Shaune Pollackhen, [place] M.D on 05/26/2016 at 3:42 PM  Between 7am to 6pm - Pager - [phone]  After 6pm go to [email or web address] - Social research officer, governmentpassword [email or web address] [email or web address]  [facility] [place] [facility]  Office  [phone]  CC: Primary care physician; Bernerd LimboKoonce, Thomas F, MD   Note: This dictation was prepared with Dragon dictation along with smaller phrase technology. Any transcriptional errors that result from this process are unintentional.

## 2016-06-28 ENCOUNTER — Other Ambulatory Visit: Payer: Self-pay | Admitting: Otolaryngology

## 2016-06-28 DIAGNOSIS — E041 Nontoxic single thyroid nodule: Secondary | ICD-10-CM

## 2016-06-29 ENCOUNTER — Other Ambulatory Visit: Payer: Self-pay | Admitting: Otolaryngology

## 2016-06-29 ENCOUNTER — Ambulatory Visit
Admission: RE | Admit: 2016-06-29 | Discharge: 2016-06-29 | Disposition: A | Discharge status: Home / Self Care | Payer: Self-pay | Source: Ambulatory Visit | Attending: Otolaryngology | Admitting: Otolaryngology

## 2016-06-29 DIAGNOSIS — E041 Nontoxic single thyroid nodule: Secondary | ICD-10-CM

## 2016-07-02 ENCOUNTER — Other Ambulatory Visit: Payer: Self-pay | Admitting: Otolaryngology

## 2016-07-02 DIAGNOSIS — E041 Nontoxic single thyroid nodule: Secondary | ICD-10-CM

## 2016-07-11 ENCOUNTER — Ambulatory Visit
Admission: RE | Admit: 2016-07-11 | Discharge: 2016-07-11 | Disposition: A | Discharge status: Home / Self Care | Payer: 59 | Source: Ambulatory Visit | Attending: Otolaryngology | Admitting: Otolaryngology

## 2016-07-11 DIAGNOSIS — E042 Nontoxic multinodular goiter: Secondary | ICD-10-CM | POA: Diagnosis not present

## 2016-07-11 DIAGNOSIS — E041 Nontoxic single thyroid nodule: Secondary | ICD-10-CM

## 2016-07-11 NOTE — Procedures (Signed)
US left thyroid FNA without difficulty  Complications:  None  Blood Loss: none  See dictation in canopy pacs

## 2016-07-12 ENCOUNTER — Ambulatory Visit: Payer: 59

## 2016-07-30 ENCOUNTER — Other Ambulatory Visit: Payer: Self-pay | Admitting: Otolaryngology

## 2016-07-30 DIAGNOSIS — E041 Nontoxic single thyroid nodule: Secondary | ICD-10-CM

## 2016-07-30 LAB — CYTOLOGY - NON PAP

## 2016-07-31 ENCOUNTER — Encounter: Payer: Self-pay | Admitting: Otolaryngology

## 2016-08-03 ENCOUNTER — Encounter: Payer: Self-pay | Admitting: Otolaryngology

## 2017-01-10 ENCOUNTER — Ambulatory Visit
Admission: RE | Admit: 2017-01-10 | Discharge: 2017-01-10 | Disposition: A | Discharge status: Home / Self Care | Payer: 59 | Source: Ambulatory Visit | Attending: Internal Medicine | Admitting: Internal Medicine

## 2017-01-10 ENCOUNTER — Other Ambulatory Visit: Payer: Self-pay | Admitting: Internal Medicine

## 2017-01-10 DIAGNOSIS — R059 Cough, unspecified: Secondary | ICD-10-CM

## 2017-01-10 DIAGNOSIS — R05 Cough: Secondary | ICD-10-CM | POA: Diagnosis not present

## 2017-05-14 ENCOUNTER — Ambulatory Visit: Payer: Managed Care, Other (non HMO) | Admitting: Internal Medicine

## 2017-05-14 ENCOUNTER — Encounter: Payer: Self-pay | Admitting: Internal Medicine

## 2017-05-14 VITALS — BP 107/72 | HR 90 | Resp 16 | Ht 62.5 in | Wt 159.6 lb

## 2017-05-14 DIAGNOSIS — J301 Allergic rhinitis due to pollen: Secondary | ICD-10-CM | POA: Diagnosis not present

## 2017-05-14 DIAGNOSIS — R059 Cough, unspecified: Secondary | ICD-10-CM

## 2017-05-14 DIAGNOSIS — J440 Chronic obstructive pulmonary disease with acute lower respiratory infection: Secondary | ICD-10-CM | POA: Diagnosis not present

## 2017-05-14 DIAGNOSIS — F17219 Nicotine dependence, cigarettes, with unspecified nicotine-induced disorders: Secondary | ICD-10-CM

## 2017-05-14 DIAGNOSIS — R05 Cough: Secondary | ICD-10-CM | POA: Diagnosis not present

## 2017-05-14 DIAGNOSIS — J209 Acute bronchitis, unspecified: Secondary | ICD-10-CM

## 2017-05-14 MED ORDER — PREDNISONE 10 MG PO TABS: ORAL_TABLET | ORAL | 0 refills | Status: DC

## 2017-05-14 MED ORDER — LEVOFLOXACIN 500 MG PO TABS: 500.0000 mg | ORAL_TABLET | Freq: Every day | ORAL | 0 refills | Status: DC

## 2017-05-14 NOTE — Progress Notes (Signed)
[facility] [address] [place], [place] [address]  Pulmonary Sleep Medicine  Office Visit Note  Patient Name: Kristi Walter DOB: 1961-01-07 MRN [id number]  Date of Service: 05/14/2017  Complaints/HPI: she is coughing and has noted some sputum. She is not smoking. She states that there is color to the sputum it is clear right now but has increased in amount. Patient is not in any distress at this time. Patient hasno fever noted denies having chest pain other rib pain from the cough  ROS  General: (-) fever, (-) chills, (-) night sweats, (-) weakness Skin: (-) rashes, (-) itching,. Eyes: (-) visual changes, (-) redness, (-) itching. Nose and Sinuses: (-) nasal stuffiness or itchiness, (-) postnasal drip, (-) nosebleeds, (-) sinus trouble. Mouth and Throat: (-) sore throat, (-) hoarseness. Neck: (-) swollen glands, (-) enlarged thyroid, (-) neck pain. Respiratory: + cough, (-) bloody sputum, + shortness of breath, + wheezing. Cardiovascular: - ankle swelling, (-) chest pain. Lymphatic: (-) lymph node enlargement. Neurologic: (-) numbness, (-) tingling. Psychiatric: (-) anxiety, (-) depression   Current Medication: Outpatient Encounter Medications as of 05/14/2017  Medication Sig  . albuterol (PROAIR HFA) 108 (90 Base) MCG/ACT inhaler Inhale 1-2 puffs into the lungs every 4 (four) hours as needed.   . benzonatate (TESSALON) 100 MG capsule Take 100 mg by mouth 3 (three) times daily as needed for cough.  . budesonide-formoterol (SYMBICORT) 160-4.5 MCG/ACT inhaler Inhale 2 puffs into the lungs 2 (two) times daily.   Marland Kitchen. EPINEPHrine (EPIPEN 2-PAK) 0.3 mg/0.3 mL IJ SOAJ injection Inject 0.3 mg into the muscle once.  Marland Kitchen. guaiFENesin (ROBITUSSIN) 100 MG/5ML SOLN Take 5 mLs (100 mg total) by mouth every 4 (four) hours as needed for cough or to loosen phlegm. (Patient not taking: Reported on 07/11/2016)  . ipratropium-albuterol (DUONEB) 0.5-2.5 (3) MG/3ML SOLN Take 3 mLs by  nebulization every 6 (six) hours as needed.   . montelukast (SINGULAIR) 10 MG tablet Take 10 mg by mouth daily.  Marland Kitchen. omeprazole (PRILOSEC) 20 MG capsule Take 20 mg by mouth daily.   . predniSONE (DELTASONE) 10 MG tablet 40 mg po daily for 2 days, 20 mg po daily for 2 days, 10 mg po daily for 2 days. (Patient not taking: Reported on 07/11/2016)  . pseudoephedrine (SUDAFED) 30 MG tablet Take 30 mg by mouth every 4 (four) hours as needed for congestion.  . theophylline (THEO-24) 100 MG 24 hr capsule Take 100 mg by mouth daily.   Marland Kitchen. tiotropium (SPIRIVA) 18 MCG inhalation capsule Place 18 mcg into inhaler and inhale daily.    No facility-administered encounter medications on file as of 05/14/2017.     Surgical History: No past surgical history on file.  Medical History: Past Medical History:  Diagnosis Date  . Asthma     Family History: No family history on file.  Social History: Social History   Socioeconomic History  . Marital status: Married    Spouse name: Not on file  . Number of children: Not on file  . Years of education: Not on file  . Highest education level: Not on file  Social Needs  . Financial resource strain: Not on file  . Food insecurity - worry: Not on file  . Food insecurity - inability: Not on file  . Transportation needs - medical: Not on file  . Transportation needs - non-medical: Not on file  Occupational History  . Not on file  Tobacco Use  . Smoking status: Current Every Day  Smoker    Packs/day: 0.25    Years: 30.00    Pack years: 7.50    Types: Cigarettes  . Smokeless tobacco: Never Used  Substance and Sexual Activity  . Alcohol use: Not on file  . Drug use: Not on file  . Sexual activity: Not on file  Other Topics Concern  . Not on file  Social History Narrative  . Not on file    Vital Signs: There were no vitals taken for this visit.  Examination: General Appearance: The patient is well-developed, well-nourished, and in no distress. Skin:  Gross inspection of skin unremarkable. Head: normocephalic, no gross deformities. Eyes: no gross deformities noted. ENT: ears appear grossly normal no exudates. Neck: Supple. No thyromegaly. No LAD. Respiratory: few rhonchi noted bilaterally. Cardiovascular: Normal S1 and S2 without murmur or rub. Extremities: No cyanosis. pulses are equal. Neurologic: Alert and oriented. No involuntary movements.  LABS: No results found for this or any previous visit (from the past 2160 hour(s)).  Radiology: Dg Chest 2 View  Result Date: 01/10/2017 CLINICAL DATA:  Cough, shortness of breath and wheezing with chest tightness 4 days. EXAM: CHEST  2 VIEW COMPARISON:  05/24/2016 FINDINGS: Lungs are adequately inflated with subtle hazy opacification over the posterior bases on the lateral film which may be due to overlapping bony and vascular structures, although cannot exclude an acute early pneumonia. Cardiomediastinal silhouette and remainder of the exam is unchanged. IMPRESSION: Possible hazy airspace process over the posterior lung bases seen best on the lateral film which may be due to early pneumonia. Recommend follow-up to resolution. Electronically Signed   By: Elberta Fortis M.D.   On: 01/10/2017 14:47    No results found.  No results found.    Assessment and Plan: Patient Active Problem List   Diagnosis Date Noted  . Acute respiratory failure with hypoxemia (HCC) 05/24/2016  . Gastro-esophageal reflux disease without esophagitis 04/26/2014  . Current tobacco use 03/06/2013  . Allergic rhinitis 07/13/2010  . Allergic to food 07/13/2010  . Chronic obstructive pulmonary disease (HCC) 08/14/2000    1. COPD she has a mild flare righ tnow. Will give her a script for levaquin and also steroids for now 2. Chronic respiratory failure with hypoxia she is on oxygen but mainly uses it at night 3. Allergi rhinitis under control right now 4. Nicotine use currently she states tha tshe does not use  tobacco  General Counseling: I have discussed the findings of the evaluation and examination with Jacoby.  I have also discussed any further diagnostic evaluation thatmay be needed or ordered today. Aarion verbalizes understanding of the findings of todays visit. We also reviewed her medications today and discussed drug interactions and side effects including but not limited excessive drowsiness and altered mental states. We also discussed that there is always a risk not just to her but also people around her. she has been encouraged to call the office with any questions or concerns that should arise related to todays visit.    Time spent:  I have personally obtained a history, examined the patient, evaluated laboratory and imaging results, formulated the assessment and plan and placed orders.    Yevonne Pax, MD [facility] Pulmonary and Critical Care Sleep medicine

## 2017-05-14 NOTE — Patient Instructions (Signed)

## 2017-06-03 ENCOUNTER — Encounter: Payer: Self-pay | Admitting: *Deleted

## 2017-06-03 NOTE — Anesthesia Pain Management Evaluation Note (Signed)
Allergic to eggs/see total allergy list

## 2017-06-04 ENCOUNTER — Ambulatory Visit
Admission: RE | Admit: 2017-06-04 | Discharge: 2017-06-04 | Disposition: A | Discharge status: Home / Self Care | Payer: Managed Care, Other (non HMO) | Source: Ambulatory Visit | Attending: Ophthalmology | Admitting: Ophthalmology

## 2017-06-04 ENCOUNTER — Encounter
Admission: RE | Disposition: A | Discharge status: Home / Self Care | Payer: Self-pay | Source: Ambulatory Visit | Attending: Ophthalmology

## 2017-06-04 ENCOUNTER — Other Ambulatory Visit: Payer: Self-pay

## 2017-06-04 ENCOUNTER — Ambulatory Visit: Payer: Managed Care, Other (non HMO) | Admitting: Certified Registered Nurse Anesthetist

## 2017-06-04 ENCOUNTER — Encounter: Payer: Self-pay | Admitting: *Deleted

## 2017-06-04 DIAGNOSIS — Z9841 Cataract extraction status, right eye: Secondary | ICD-10-CM | POA: Insufficient documentation

## 2017-06-04 DIAGNOSIS — Z7951 Long term (current) use of inhaled steroids: Secondary | ICD-10-CM | POA: Insufficient documentation

## 2017-06-04 DIAGNOSIS — K219 Gastro-esophageal reflux disease without esophagitis: Secondary | ICD-10-CM | POA: Diagnosis not present

## 2017-06-04 DIAGNOSIS — Z79899 Other long term (current) drug therapy: Secondary | ICD-10-CM | POA: Insufficient documentation

## 2017-06-04 DIAGNOSIS — F17200 Nicotine dependence, unspecified, uncomplicated: Secondary | ICD-10-CM | POA: Insufficient documentation

## 2017-06-04 DIAGNOSIS — H2512 Age-related nuclear cataract, left eye: Secondary | ICD-10-CM | POA: Insufficient documentation

## 2017-06-04 DIAGNOSIS — Z91030 Bee allergy status: Secondary | ICD-10-CM | POA: Insufficient documentation

## 2017-06-04 DIAGNOSIS — J449 Chronic obstructive pulmonary disease, unspecified: Secondary | ICD-10-CM | POA: Diagnosis not present

## 2017-06-04 HISTORY — PX: CATARACT EXTRACTION W/PHACO: SHX586

## 2017-06-04 HISTORY — DX: Wheezing: R06.2

## 2017-06-04 HISTORY — DX: Gastro-esophageal reflux disease without esophagitis: K21.9

## 2017-06-04 HISTORY — DX: Dyspnea, unspecified: R06.00

## 2017-06-04 HISTORY — DX: Orthopnea: R06.01

## 2017-06-04 SURGERY — PHACOEMULSIFICATION, CATARACT, WITH IOL INSERTION
Anesthesia: General | Site: Eye | Laterality: Left | Wound class: Clean

## 2017-06-04 MED ORDER — POVIDONE-IODINE 5 % OP SOLN
OPHTHALMIC | Status: DC | PRN
  Administered 2017-06-04: 1 via OPHTHALMIC

## 2017-06-04 MED ORDER — EPINEPHRINE PF 1 MG/ML IJ SOLN
INTRAOCULAR | Status: DC | PRN
  Administered 2017-06-04: 08:00:00 via OPHTHALMIC

## 2017-06-04 MED ORDER — CARBACHOL 0.01 % IO SOLN
INTRAOCULAR | Status: DC | PRN
  Administered 2017-06-04: 0.5 mL via INTRAOCULAR

## 2017-06-04 MED ORDER — SODIUM CHLORIDE 0.9 % IV SOLN
INTRAVENOUS | Status: DC
  Administered 2017-06-04: 07:00:00 via INTRAVENOUS

## 2017-06-04 MED ORDER — BSS IO SOLN
INTRAOCULAR | Status: DC | PRN
  Administered 2017-06-04: 4 mL via OPHTHALMIC

## 2017-06-04 MED ORDER — POVIDONE-IODINE 5 % OP SOLN
OPHTHALMIC | Status: AC
  Filled 2017-06-04: qty 30

## 2017-06-04 MED ORDER — FENTANYL CITRATE (PF) 100 MCG/2ML IJ SOLN
INTRAMUSCULAR | Status: DC | PRN
  Administered 2017-06-04: 25 ug via INTRAVENOUS
  Administered 2017-06-04: 75 ug via INTRAVENOUS

## 2017-06-04 MED ORDER — EPINEPHRINE PF 1 MG/ML IJ SOLN
INTRAMUSCULAR | Status: AC
  Filled 2017-06-04: qty 2

## 2017-06-04 MED ORDER — ARMC OPHTHALMIC DILATING DROPS
1.0000 "application " | OPHTHALMIC | Status: AC
  Administered 2017-06-04 (×3): 1 via OPHTHALMIC

## 2017-06-04 MED ORDER — FENTANYL CITRATE (PF) 100 MCG/2ML IJ SOLN
INTRAMUSCULAR | Status: AC
Start: 2017-06-04 — End: ?
  Filled 2017-06-04: qty 2

## 2017-06-04 MED ORDER — NA CHONDROIT SULF-NA HYALURON 40-17 MG/ML IO SOLN
INTRAOCULAR | Status: DC | PRN
  Administered 2017-06-04: 1 mL via INTRAOCULAR

## 2017-06-04 MED ORDER — LIDOCAINE HCL (PF) 4 % IJ SOLN
INTRAMUSCULAR | Status: AC
  Filled 2017-06-04: qty 5

## 2017-06-04 MED ORDER — ARMC OPHTHALMIC DILATING DROPS
OPHTHALMIC | Status: AC
  Filled 2017-06-04: qty 0.4

## 2017-06-04 MED ORDER — MOXIFLOXACIN HCL 0.5 % OP SOLN
OPHTHALMIC | Status: AC
  Filled 2017-06-04: qty 3

## 2017-06-04 MED ORDER — NA CHONDROIT SULF-NA HYALURON 40-17 MG/ML IO SOLN
INTRAOCULAR | Status: AC
  Filled 2017-06-04: qty 1

## 2017-06-04 MED ORDER — MOXIFLOXACIN HCL 0.5 % OP SOLN
OPHTHALMIC | Status: DC | PRN
  Administered 2017-06-04: 0.2 mL via OPHTHALMIC

## 2017-06-04 MED ORDER — MOXIFLOXACIN HCL 0.5 % OP SOLN: 1.0000 [drp] | OPHTHALMIC | Status: DC | PRN

## 2017-06-04 SURGICAL SUPPLY — 16 items
GLOVE BIO SURGEON STRL SZ8 (GLOVE) ×3 IMPLANT
GLOVE BIOGEL M 6.5 STRL (GLOVE) ×3 IMPLANT
GLOVE SURG LX 8.0 MICRO (GLOVE) ×2
GLOVE SURG LX STRL 8.0 MICRO (GLOVE) ×1 IMPLANT
GOWN STRL REUS W/ TWL LRG LVL3 (GOWN DISPOSABLE) ×2 IMPLANT
GOWN STRL REUS W/TWL LRG LVL3 (GOWN DISPOSABLE) ×4
LABEL CATARACT MEDS ST (LABEL) ×3 IMPLANT
LENS IOL TECNIS ITEC 22.5 (Intraocular Lens) ×3 IMPLANT
PACK CATARACT (MISCELLANEOUS) ×3 IMPLANT
PACK CATARACT BRASINGTON LX (MISCELLANEOUS) ×3 IMPLANT
PACK EYE AFTER SURG (MISCELLANEOUS) ×3 IMPLANT
SOL BSS BAG (MISCELLANEOUS) ×3
SOLUTION BSS BAG (MISCELLANEOUS) ×1 IMPLANT
SYR 5ML LL (SYRINGE) ×3 IMPLANT
WATER STERILE IRR 250ML POUR (IV SOLUTION) ×3 IMPLANT
WIPE NON LINTING 3.25X3.25 (MISCELLANEOUS) ×3 IMPLANT

## 2017-06-04 NOTE — Op Note (Signed)
PREOPERATIVE DIAGNOSIS:  Nuclear sclerotic cataract of the left eye.   POSTOPERATIVE DIAGNOSIS:  Nuclear sclerotic cataract of the left eye.   OPERATIVE PROCEDURE: Procedure(s): CATARACT EXTRACTION PHACO AND INTRAOCULAR LENS PLACEMENT (IOC)   SURGEON:  Galen ManilaWilliam Jermario Kalmar, MD.   ANESTHESIA:  Anesthesiologist: Christia ReadingHowell, Scott T, MD CRNA: Malva CoganBeane, Catherine, CRNA; Junious SilkNoles, Mark, CRNA  1.      Managed anesthesia care. 2.     0.471ml of Shugarcaine was instilled following the paracentesis   COMPLICATIONS:  None.   TECHNIQUE:   Stop and chop   DESCRIPTION OF PROCEDURE:  The patient was examined and consented in the preoperative holding area where the aforementioned topical anesthesia was applied to the left eye and then brought back to the Operating Room where the left eye was prepped and draped in the usual sterile ophthalmic fashion and a lid speculum was placed. A paracentesis was created with the side port blade and the anterior chamber was filled with viscoelastic. A near clear corneal incision was performed with the steel keratome. A continuous curvilinear capsulorrhexis was performed with a cystotome followed by the capsulorrhexis forceps. Hydrodissection and hydrodelineation were carried out with BSS on a blunt cannula. The lens was removed in a stop and chop  technique and the remaining cortical material was removed with the irrigation-aspiration handpiece. The capsular bag was inflated with viscoelastic and the Technis [id number] lens was placed in the capsular bag without complication. The remaining viscoelastic was removed from the eye with the irrigation-aspiration handpiece. The wounds were hydrated. The anterior chamber was flushed with Miostat and the eye was inflated to physiologic pressure. 0.621ml Vigamox was placed in the anterior chamber. The wounds were found to be water tight. The eye was dressed with Vigamox. The patient was given protective glasses to wear throughout the day and a shield with  which to sleep tonight. The patient was also given drops with which to begin a drop regimen today and will follow-up with me in one day. Implant Name Type Inv. Item Serial No. Manufacturer Lot No. LRB No. Used  LENS IOL DIOP 22.5 - [id number] Intraocular Lens LENS IOL DIOP 22.5 [id number] AMO  Left 1    Procedure(s) with comments: CATARACT EXTRACTION PHACO AND INTRAOCULAR LENS PLACEMENT (IOC) (Left) - US 00:58.5 AP% 22.3 CDE 13.03 Fluid Pack Lot # [id number]  Electronically signed: Galen ManilaWilliam Kendale Rembold 06/04/2017 8:43 AM

## 2017-06-04 NOTE — Transfer of Care (Signed)
Immediate Anesthesia Transfer of Care Note  Patient: Kristi Walter  Procedure(s) Performed: CATARACT EXTRACTION PHACO AND INTRAOCULAR LENS PLACEMENT (IOC) (Left Eye)  Patient Location: PACU  Anesthesia Type:MAC  Level of Consciousness: awake, alert  and oriented  Airway & Oxygen Therapy: Patient Spontanous Breathing  Post-op Assessment: Report given to RN and Post -op Vital signs reviewed and stable  Post vital signs: Reviewed and stable  Last Vitals:  Vitals:   06/04/17 0711  BP: (!) 151/77  Pulse: 79  Resp: 16  Temp: (!) 36.2 C  SpO2: 98%    Last Pain:  Vitals:   06/04/17 0711  TempSrc: Temporal         Complications: No apparent anesthesia complications

## 2017-06-04 NOTE — Anesthesia Procedure Notes (Signed)
Procedure Name: MAC Performed by: Riti Rollyson, CRNA Pre-anesthesia Checklist: Patient identified, Suction available, Emergency Drugs available, Patient being monitored and Timeout performed Oxygen Delivery Method: Nasal cannula       

## 2017-06-04 NOTE — Discharge Instructions (Signed)
Follow Dr. Gerome SamPorfilio's postop eye drop instruction sheet as reviewed.  Eye Surgery Discharge Instructions  Expect mild scratchy sensation or mild soreness. DO NOT RUB YOUR EYE!  The day of surgery:  Minimal physical activity, but bed rest is not required  No reading, computer work, or close hand work  No bending, lifting, or straining.  May watch TV  For 24 hours:  No driving, legal decisions, or alcoholic beverages  Safety precautions  Eat anything you prefer: It is better to start with liquids, then soup then solid foods.  _____ Eye patch should be worn until postoperative exam tomorrow.  ____ Solar shield eyeglasses should be worn for comfort in the sunlight/patch while sleeping  Resume all regular medications including aspirin or Coumadin if these were discontinued prior to surgery. You may shower, bathe, shave, or wash your hair. Tylenol may be taken for mild discomfort.  Call your doctor if you experience significant pain, nausea, or vomiting, fever > 101 or other signs of infection. [phone] or [phone] Specific instructions:  Follow-up Information    Galen ManilaPorfilio, William, MD Follow up.   Specialty:  Ophthalmology Why:  06/05/17 @ 8:10 am Contact information: [address] [place] [address] [phone]

## 2017-06-04 NOTE — Anesthesia Postprocedure Evaluation (Signed)
Anesthesia Post Note  Patient: Kristi Walter  Procedure(s) Performed: CATARACT EXTRACTION PHACO AND INTRAOCULAR LENS PLACEMENT (IOC) (Left Eye)  Patient location during evaluation: PACU Anesthesia Type: MAC Level of consciousness: awake and alert and oriented Pain management: satisfactory to patient Vital Signs Assessment: post-procedure vital signs reviewed and stable Respiratory status: respiratory function stable Cardiovascular status: stable Anesthetic complications: no     Last Vitals:  Vitals:   06/04/17 0711  BP: (!) 151/77  Pulse: 79  Resp: 16  Temp: (!) 36.2 C  SpO2: 98%    Last Pain:  Vitals:   06/04/17 0711  TempSrc: Temporal                 Blima Singer

## 2017-06-04 NOTE — H&P (Signed)
All labs reviewed. Abnormal studies sent to patients PCP when indicated.  Previous H&P reviewed, patient examined, there are NO CHANGES.  Kristi Rickert Porfilio2/19/20198:16 AM

## 2017-06-04 NOTE — Anesthesia Preprocedure Evaluation (Signed)
Anesthesia Evaluation  Patient identified by MRN, date of birth, ID band Patient awake    Reviewed: Allergy & Precautions, H&P , NPO status , reviewed documented beta blocker date and time   Airway Mallampati: III  TM Distance: >3 FB     Dental no notable dental hx.    Pulmonary shortness of breath, asthma , pneumonia, COPD,  COPD inhaler, Current Smoker,     + decreased breath sounds      Cardiovascular + Orthopnea  Normal cardiovascular exam     Neuro/Psych    GI/Hepatic GERD  ,  Endo/Other    Renal/GU      Musculoskeletal   Abdominal   Peds  Hematology   Anesthesia Other Findings   Reproductive/Obstetrics                             Anesthesia Physical Anesthesia Plan  ASA: III  Anesthesia Plan: General   Post-op Pain Management:    Induction:   PONV Risk Score and Plan: 2 and Propofol infusion  Airway Management Planned:   Additional Equipment:   Intra-op Plan:   Post-operative Plan:   Informed Consent: I have reviewed the patients History and Physical, chart, labs and discussed the procedure including the risks, benefits and alternatives for the proposed anesthesia with the patient or authorized representative who has indicated his/her understanding and acceptance.   Dental Advisory Given  Plan Discussed with:   Anesthesia Plan Comments:         Anesthesia Quick Evaluation

## 2017-06-04 NOTE — Anesthesia Post-op Follow-up Note (Signed)
Anesthesia QCDR form completed.        

## 2017-07-05 IMAGING — CR DG CHEST 2V
2 series · 2 of 2 positions shown · non-contrast
Comparison: Chest CT 05/09/2016

CLINICAL DATA: Shortness of breath and flu-like symptoms

EXAM:
CHEST  2 VIEW

[chest pa]
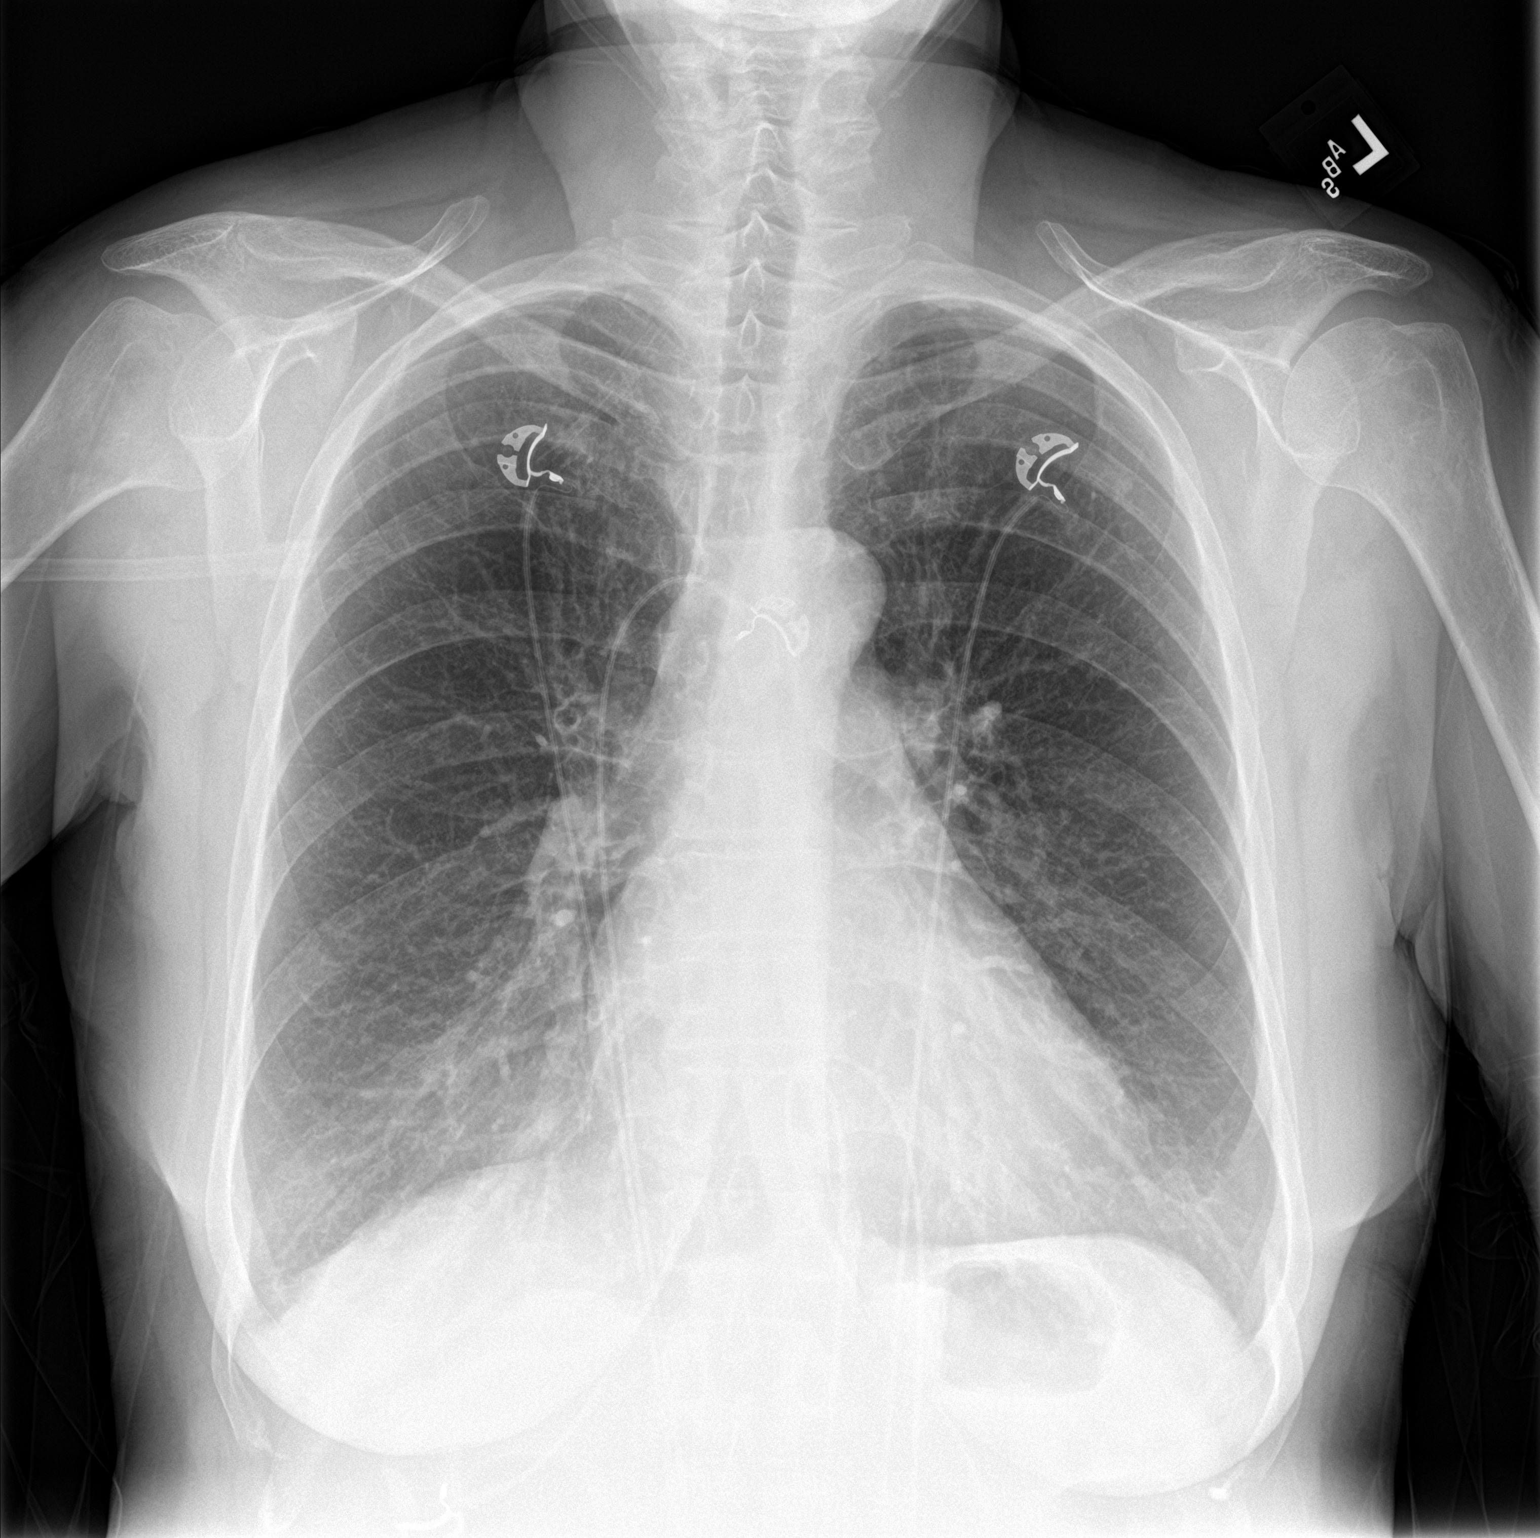

[chest lat]
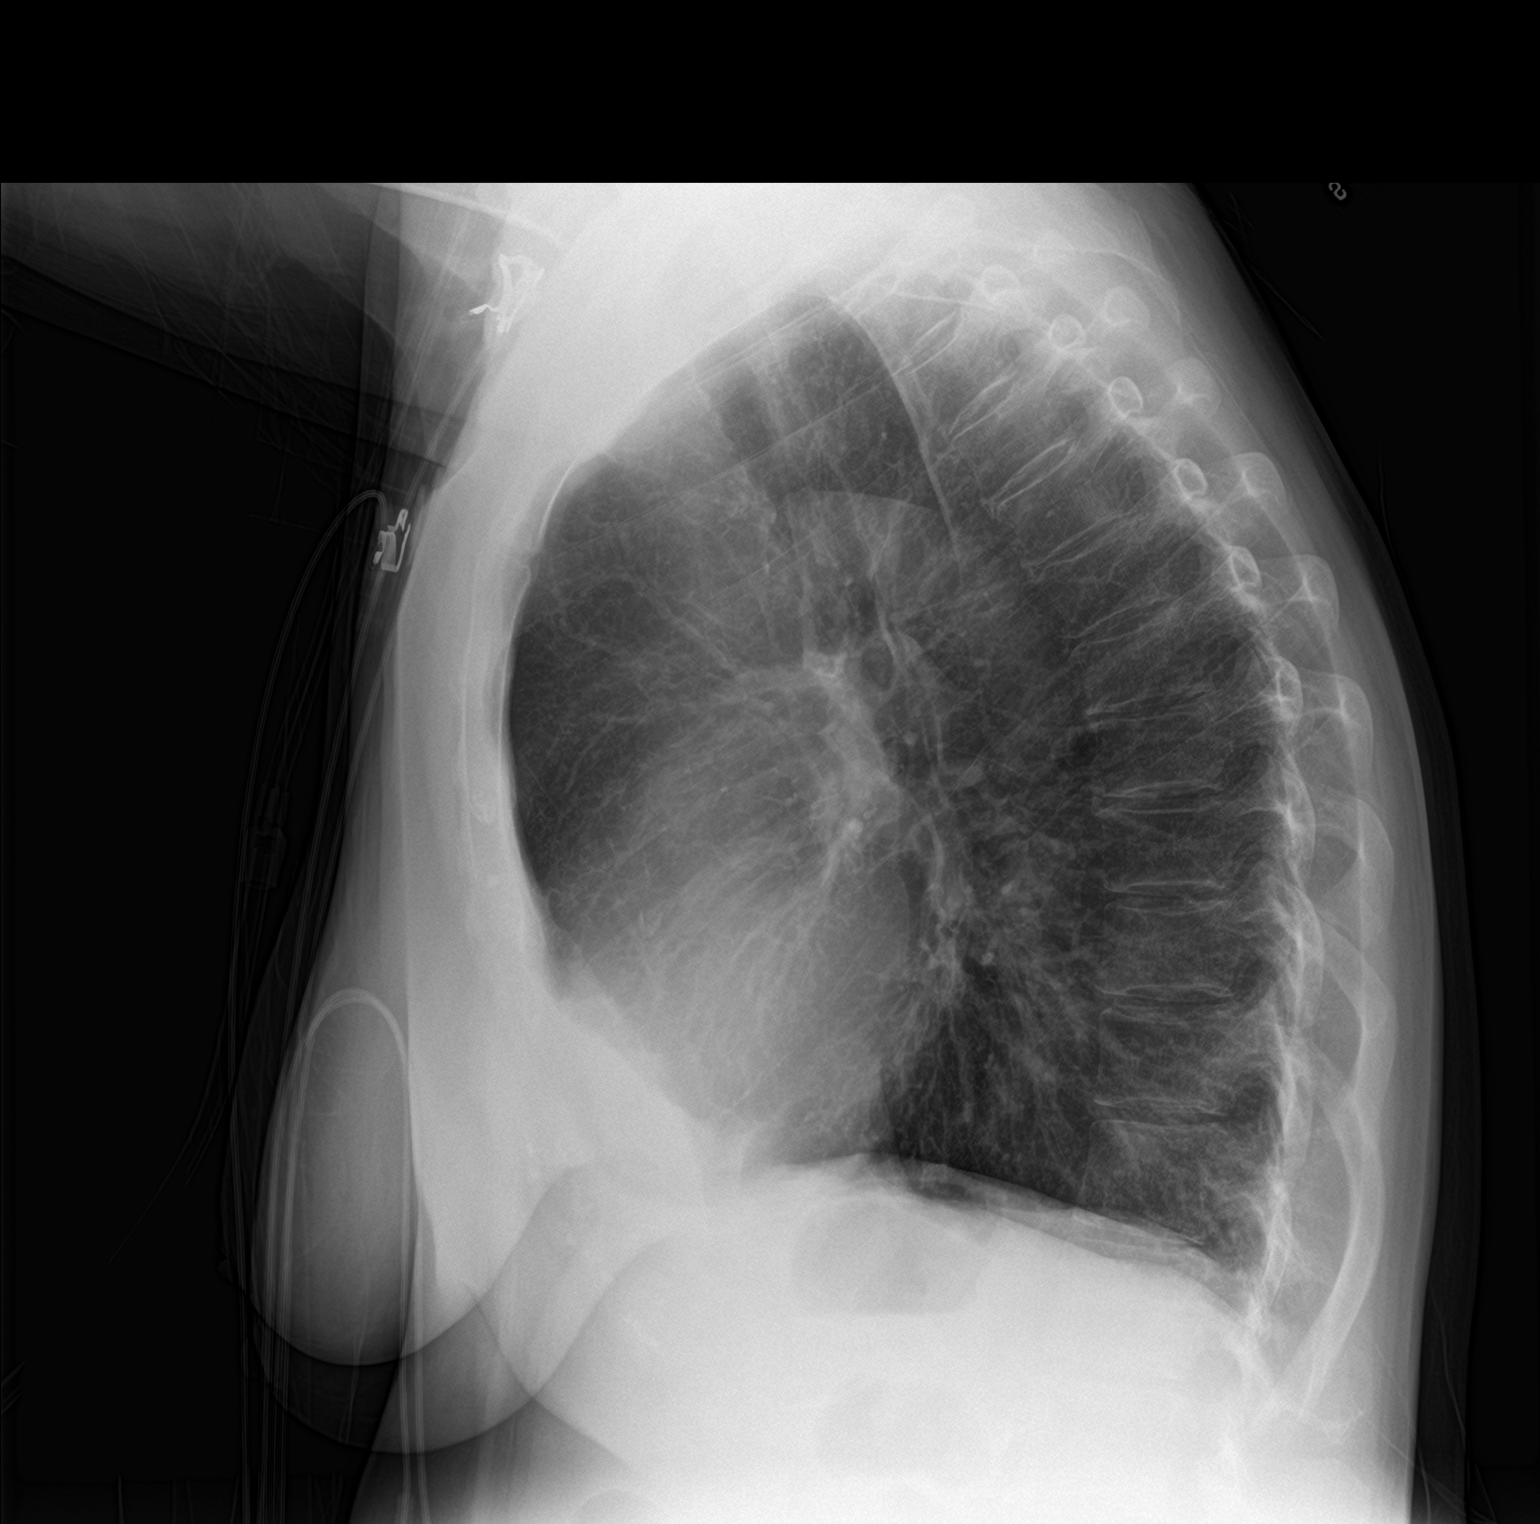

[2 of 2 positions shown; findings below may reference images not displayed]

FINDINGS: The lungs are well inflated. Cardiomediastinal contours are normal.
No pneumothorax or pleural effusion. No focal airspace consolidation
or pulmonary edema.
IMPRESSION: No active cardiopulmonary disease.

## 2017-07-07 ENCOUNTER — Other Ambulatory Visit: Payer: Self-pay | Admitting: Internal Medicine

## 2017-07-08 ENCOUNTER — Other Ambulatory Visit: Payer: Self-pay

## 2017-07-08 MED ORDER — VARENICLINE TARTRATE 1 MG PO TABS: 1.0000 mg | ORAL_TABLET | Freq: Two times a day (BID) | ORAL | 3 refills | Status: DC

## 2017-07-12 ENCOUNTER — Other Ambulatory Visit: Payer: Self-pay | Admitting: Internal Medicine

## 2017-07-30 ENCOUNTER — Ambulatory Visit
Admission: RE | Admit: 2017-07-30 | Discharge: 2017-07-30 | Disposition: A | Discharge status: Home / Self Care | Payer: Managed Care, Other (non HMO) | Source: Ambulatory Visit | Attending: Otolaryngology | Admitting: Otolaryngology

## 2017-07-30 DIAGNOSIS — E041 Nontoxic single thyroid nodule: Secondary | ICD-10-CM | POA: Insufficient documentation

## 2017-07-31 ENCOUNTER — Other Ambulatory Visit: Payer: Self-pay | Admitting: Otolaryngology

## 2017-07-31 DIAGNOSIS — E041 Nontoxic single thyroid nodule: Secondary | ICD-10-CM

## 2017-09-12 ENCOUNTER — Other Ambulatory Visit: Payer: Self-pay

## 2017-09-12 MED ORDER — ALBUTEROL SULFATE HFA 108 (90 BASE) MCG/ACT IN AERS: 1.0000 | INHALATION_SPRAY | RESPIRATORY_TRACT | 3 refills | Status: DC | PRN

## 2017-09-25 ENCOUNTER — Other Ambulatory Visit: Payer: Self-pay | Admitting: Internal Medicine

## 2017-09-25 ENCOUNTER — Telehealth: Payer: Self-pay | Admitting: Internal Medicine

## 2017-09-25 MED ORDER — THEOPHYLLINE ER 100 MG PO TB12: 100.0000 mg | ORAL_TABLET | Freq: Two times a day (BID) | ORAL | 12 refills | Status: DC

## 2017-09-25 MED ORDER — THEOPHYLLINE ER 100 MG PO TB12: 100.0000 mg | ORAL_TABLET | Freq: Two times a day (BID) | ORAL | 3 refills | Status: DC

## 2017-09-25 NOTE — Telephone Encounter (Signed)
Patient states that she was taken theo 24 300mg  1 daily, pt did not know that [facility] not get that anymore but does have theophylline , pt is completely out of medication she needs a rx to go to local pharmacy and 90 day supply to [facility]

## 2017-09-25 NOTE — Progress Notes (Signed)
theoph

## 2017-09-25 NOTE — Telephone Encounter (Signed)
New rx is sent

## 2017-09-26 ENCOUNTER — Other Ambulatory Visit: Payer: Self-pay | Admitting: Internal Medicine

## 2017-09-26 MED ORDER — THEOPHYLLINE ER 300 MG PO CP24: 300.0000 mg | ORAL_CAPSULE | Freq: Every day | ORAL | 3 refills | Status: DC

## 2017-10-01 ENCOUNTER — Ambulatory Visit: Payer: Self-pay | Admitting: Internal Medicine

## 2017-10-01 ENCOUNTER — Other Ambulatory Visit: Payer: Self-pay

## 2017-10-29 ENCOUNTER — Ambulatory Visit
Admission: RE | Admit: 2017-10-29 | Discharge: 2017-10-29 | Disposition: A | Discharge status: Home / Self Care | Payer: Managed Care, Other (non HMO) | Source: Ambulatory Visit | Attending: Adult Health | Admitting: Adult Health

## 2017-10-29 ENCOUNTER — Ambulatory Visit
Admission: RE | Admit: 2017-10-29 | Discharge: 2017-10-29 | Disposition: A | Discharge status: Home / Self Care | Payer: Managed Care, Other (non HMO) | Source: Ambulatory Visit | Attending: Internal Medicine | Admitting: Internal Medicine

## 2017-10-29 ENCOUNTER — Ambulatory Visit: Payer: Managed Care, Other (non HMO) | Admitting: Internal Medicine

## 2017-10-29 ENCOUNTER — Encounter: Payer: Self-pay | Admitting: Internal Medicine

## 2017-10-29 ENCOUNTER — Other Ambulatory Visit: Payer: Self-pay

## 2017-10-29 VITALS — BP 92/72 | HR 95 | Resp 16 | Ht 60.0 in | Wt 164.0 lb

## 2017-10-29 DIAGNOSIS — J449 Chronic obstructive pulmonary disease, unspecified: Secondary | ICD-10-CM

## 2017-10-29 DIAGNOSIS — R0602 Shortness of breath: Secondary | ICD-10-CM

## 2017-10-29 DIAGNOSIS — Z87891 Personal history of nicotine dependence: Secondary | ICD-10-CM | POA: Diagnosis not present

## 2017-10-29 DIAGNOSIS — Z720 Tobacco use: Secondary | ICD-10-CM

## 2017-10-29 MED ORDER — LEVOFLOXACIN 500 MG PO TABS: 500.0000 mg | ORAL_TABLET | Freq: Every day | ORAL | 0 refills | Status: DC

## 2017-10-29 MED ORDER — PREDNISONE 10 MG PO TABS: ORAL_TABLET | ORAL | 0 refills | Status: DC

## 2017-10-29 MED ORDER — IPRATROPIUM-ALBUTEROL 0.5-2.5 (3) MG/3ML IN SOLN
3.0000 mL | Freq: Once | RESPIRATORY_TRACT | Status: AC
Start: 2017-10-29 — End: 2017-10-29
  Administered 2017-10-29: 3 mL via RESPIRATORY_TRACT

## 2017-10-29 NOTE — Progress Notes (Signed)
[facility] [address] [place], [place] [address]  Internal MEDICINE  Office Visit Note  Patient Name: Kristi Walter  [id number]  [id number]  Date of Service: 10/29/2017  Chief Complaint  Patient presents with  . Follow-up  . COPD    HPI Pt here today for follow up for COPD.  Pt has not been hospitalized recently.  She has been taking her inhalers.  She reports using her rescue inhaler up to 4 x a day when the weather is hot.  She is avoiding going outside and is still reporting use of her nebulizer 4 x daily. Denies fever.      Current Medication: Outpatient Encounter Medications as of 10/29/2017  Medication Sig  . albuterol (PROAIR HFA) 108 (90 Base) MCG/ACT inhaler Inhale 1-2 puffs into the lungs every 4 (four) hours as needed for wheezing or shortness of breath.  . benzonatate (TESSALON) 100 MG capsule TAKE 1 CAPSULE BY MOUTH  EVERY 12 HOURS AS NEEDED  FOR COUGH  . budesonide-formoterol (SYMBICORT) 160-4.5 MCG/ACT inhaler Inhale 2 puffs into the lungs 2 (two) times daily.   Marland Kitchen. EPINEPHrine (EPIPEN 2-PAK) 0.3 mg/0.3 mL IJ SOAJ injection Inject 0.3 mg into the muscle once.  Marland Kitchen. ipratropium-albuterol (DUONEB) 0.5-2.5 (3) MG/3ML SOLN Take 3 mLs by nebulization every 6 (six) hours as needed (for shortness of breath or wheezing).   . montelukast (SINGULAIR) 10 MG tablet Take 10 mg by mouth daily.  Marland Kitchen. omeprazole (PRILOSEC) 20 MG capsule Take 20 mg by mouth daily.   . theophylline (THEO-24) 300 MG 24 hr capsule Take 1 capsule (300 mg total) by mouth daily.  Marland Kitchen. tiotropium (SPIRIVA) 18 MCG inhalation capsule Place 18 mcg into inhaler and inhale 2 (two) times daily.   . varenicline (CHANTIX) 1 MG tablet Take 1 tablet (1 mg total) by mouth 2 (two) times daily.  Marland Kitchen. guaiFENesin (ROBITUSSIN) 100 MG/5ML SOLN Take 5 mLs (100 mg total) by mouth every 4 (four) hours as needed for cough or to loosen phlegm. (Patient not taking: Reported on 07/11/2016)   No facility-administered encounter  medications on file as of 10/29/2017.     Surgical History: Past Surgical History:  Procedure Laterality Date  . CATARACT EXTRACTION W/PHACO Left 06/04/2017   Procedure: CATARACT EXTRACTION PHACO AND INTRAOCULAR LENS PLACEMENT (IOC);  Surgeon: Galen ManilaPorfilio, William, MD;  Location: [facility] ORS;  Service: Ophthalmology;  Laterality: Left;  US 00:58.5 AP% 22.3 CDE 13.03 Fluid Pack Lot # [id number] H  . CESAREAN SECTION    . EYE SURGERY    . FOOT SURGERY    . pyloric     stenosis surgery  . THROAT SURGERY    . VAGINAL HYSTERECTOMY  1989    Medical History: Past Medical History:  Diagnosis Date  . Asthma   . COPD (chronic obstructive pulmonary disease) (HCC)   . Dyspnea   . GERD (gastroesophageal reflux disease)   . Orthopnea   . Pneumonia   . Wheezing     Family History: Family History  Problem Relation Age of Onset  . Hyperlipidemia Mother   . Hypertension Mother   . Stroke Mother     Social History   Socioeconomic History  . Marital status: Married    Spouse name: Not on file  . Number of children: Not on file  . Years of education: Not on file  . Highest education level: Not on file  Occupational History  . Not on file  Social Needs  . Financial resource strain: Not on  file  . Food insecurity:    Worry: Not on file    Inability: Not on file  . Transportation needs:    Medical: Not on file    Non-medical: Not on file  Tobacco Use  . Smoking status: Former Smoker    Packs/day: 0.25    Years: 30.00    Pack years: 7.50    Types: Cigarettes    Last attempt to quit: 07/30/2017    Years since quitting: 0.2  . Smokeless tobacco: Never Used  Substance and Sexual Activity  . Alcohol use: No    Frequency: Never  . Drug use: No  . Sexual activity: Not on file  Lifestyle  . Physical activity:    Days per week: Not on file    Minutes per session: Not on file  . Stress: Not on file  Relationships  . Social connections:    Talks on phone: Not on file    Gets together:  Not on file    Attends religious service: Not on file    Active member of club or organization: Not on file    Attends meetings of clubs or organizations: Not on file    Relationship status: Not on file  . Intimate partner violence:    Fear of current or ex partner: Not on file    Emotionally abused: Not on file    Physically abused: Not on file    Forced sexual activity: Not on file  Other Topics Concern  . Not on file  Social History Narrative  . Not on file      Review of Systems  Constitutional: Negative for chills, fatigue and unexpected weight change.  HENT: Negative for congestion, rhinorrhea, sneezing and sore throat.   Eyes: Negative for photophobia, pain and redness.  Respiratory: Positive for cough, shortness of breath and wheezing. Negative for chest tightness.   Cardiovascular: Negative for chest pain and palpitations.  Gastrointestinal: Negative for abdominal pain, constipation, diarrhea, nausea and vomiting.  Endocrine: Negative.   Genitourinary: Negative for dysuria and frequency.  Musculoskeletal: Negative for arthralgias, back pain, joint swelling and neck pain.  Skin: Negative for rash.  Allergic/Immunologic: Negative.   Neurological: Negative for tremors and numbness.  Hematological: Negative for adenopathy. Does not bruise/bleed easily.  Psychiatric/Behavioral: Negative for behavioral problems and sleep disturbance. The patient is not nervous/anxious.     Vital Signs: BP 92/72   Pulse 95   Resp 16   Ht 5' (1.524 m)   Wt 164 lb (74.4 kg)   SpO2 95%   BMI 32.03 kg/m    Physical Exam  Constitutional: She is oriented to person, place, and time. She appears well-developed and well-nourished. No distress.  HENT:  Head: Normocephalic and atraumatic.  Mouth/Throat: Oropharynx is clear and moist. No oropharyngeal exudate.  Eyes: Pupils are equal, round, and reactive to light. EOM are normal.  Neck: Normal range of motion. Neck supple. No JVD present. No  tracheal deviation present. No thyromegaly present.  Cardiovascular: Normal rate, regular rhythm and normal heart sounds. Exam reveals no gallop and no friction rub.  No murmur heard. Pulmonary/Chest: Effort normal. No respiratory distress. She has wheezes. She has no rales. She exhibits no tenderness.  Abdominal: Soft. There is no tenderness. There is no guarding.  Musculoskeletal: Normal range of motion.  Lymphadenopathy:    She has no cervical adenopathy.  Neurological: She is alert and oriented to person, place, and time. No cranial nerve deficit.  Skin: Skin is warm and  dry. She is not diaphoretic.  Psychiatric: She has a normal mood and affect. Her behavior is normal. Judgment and thought content normal.  Nursing note and vitals reviewed.    Assessment/Plan:  1. SOB (shortness of breath) Breathing TX in office.  Wheezing noted.  Pt states she has been getting worse over last few weeks.  - Spirometry with Graph - DG Chest 2 View; Future - ipratropium-albuterol (DUONEB) 0.5-2.5 (3) MG/3ML nebulizer solution 3 mL  2. Chronic obstructive pulmonary disease, unspecified COPD type (HCC)  Continue current medications as prescribed.  - DG Chest 2 View; Future - predniSONE (DELTASONE) 10 MG tablet; Use per dose pack  Dispense: 21 tablet; Refill: 0 - levofloxacin (LEVAQUIN) 500 MG tablet; Take 1 tablet (500 mg total) by mouth daily.  Dispense: 10 tablet; Refill: 0  3. Smoking trying to quit  Smoking cessation counseling: Encouraged patient to continue to avoid cigarettes.  She has been without for 3 months.   1. Pt acknowledges the risks of long term smoking, she will try to quite smoking. 2. Options for different medications including nicotine products, chewing gum, patch etc, Wellbutrin and Chantix is discussed 3. Goal and date of compete cessation is discussed 4. Total time spent in smoking cessation is 10 min.  General Counseling: Vandella verbalizes understanding of the findings of  todays visit and agrees with plan of treatment. I have discussed any further diagnostic evaluation that may be needed or ordered today. We also reviewed her medications today. she has been encouraged to call the office with any questions or concerns that should arise related to todays visit.    Orders Placed This Encounter  Procedures  . Spirometry with Graph    No orders of the defined types were placed in this encounter.   Time spent: 25 Minutes   This patient was seen by Blima Ledger AGNP-C in Collaboration with Dr Lyndon Code as a part of collaborative care agreement    Dr Lyndon Code Internal medicine

## 2017-11-04 ENCOUNTER — Encounter: Payer: Self-pay | Admitting: Internal Medicine

## 2017-11-04 ENCOUNTER — Other Ambulatory Visit: Payer: Self-pay

## 2017-11-04 ENCOUNTER — Telehealth: Payer: Self-pay

## 2017-11-04 NOTE — Telephone Encounter (Signed)
Called in.

## 2017-11-04 NOTE — Telephone Encounter (Signed)
Called in z pack use as direct #1 per Dr. Beverely RisenFozia Khan.

## 2017-11-04 NOTE — Telephone Encounter (Signed)
Just bronchitis, call in Z pak use as directed # 1

## 2017-11-04 NOTE — Patient Instructions (Signed)
Coping with Quitting Smoking Quitting smoking is a physical and mental challenge. You will face cravings, withdrawal symptoms, and temptation. Before quitting, work with your health care provider to make a plan that can help you cope. Preparation can help you quit and keep you from giving in. How can I cope with cravings? Cravings usually last for 5-10 minutes. If you get through it, the craving will pass. Consider taking the following actions to help you cope with cravings:  Keep your mouth busy: ? Chew sugar-free gum. ? Suck on hard candies or a straw. ? Brush your teeth.  Keep your hands and body busy: ? Immediately change to a different activity when you feel a craving. ? Squeeze or play with a ball. ? Do an activity or a hobby, like making bead jewelry, practicing needlepoint, or working with wood. ? Mix up your normal routine. ? Take a short exercise break. Go for a quick walk or run up and down stairs. ? Spend time in public places where smoking is not allowed.  Focus on doing something kind or helpful for someone else.  Call a friend or family member to talk during a craving.  Join a support group.  Call a quit line, such as 1-800-QUIT-NOW.  Talk with your health care provider about medicines that might help you cope with cravings and make quitting easier for you.  How can I deal with withdrawal symptoms? Your body may experience negative effects as it tries to get used to not having nicotine in the system. These effects are called withdrawal symptoms. They may include:  Feeling hungrier than normal.  Trouble concentrating.  Irritability.  Trouble sleeping.  Feeling depressed.  Restlessness and agitation.  Craving a cigarette.  To manage withdrawal symptoms:  Avoid places, people, and activities that trigger your cravings.  Remember why you want to quit.  Get plenty of sleep.  Avoid coffee and other caffeinated drinks. These may worsen some of your  symptoms.  How can I handle social situations? Social situations can be difficult when you are quitting smoking, especially in the first few weeks. To manage this, you can:  Avoid parties, bars, and other social situations where people might be smoking.  Avoid alcohol.  Leave right away if you have the urge to smoke.  Explain to your family and friends that you are quitting smoking. Ask for understanding and support.  Plan activities with friends or family where smoking is not an option.  What are some ways I can cope with stress? Wanting to smoke may cause stress, and stress can make you want to smoke. Find ways to manage your stress. Relaxation techniques can help. For example:  Breathe slowly and deeply, in through your nose and out through your mouth.  Listen to soothing, relaxing music.  Talk with a family member or friend about your stress.  Light a candle.  Soak in a bath or take a shower.  Think about a peaceful place.  What are some ways I can prevent weight gain? Be aware that many people gain weight after they quit smoking. However, not everyone does. To keep from gaining weight, have a plan in place before you quit and stick to the plan after you quit. Your plan should include:  Having healthy snacks. When you have a craving, it may help to: ? Eat plain popcorn, crunchy carrots, celery, or other cut vegetables. ? Chew sugar-free gum.  Changing how you eat: ? Eat small portion sizes at meals. ?   Eat 4-6 small meals throughout the day instead of 1-2 large meals a day. ? Be mindful when you eat. Do not watch television or do other things that might distract you as you eat.  Exercising regularly: ? Make time to exercise each day. If you do not have time for a long workout, do short bouts of exercise for 5-10 minutes several times a day. ? Do some form of strengthening exercise, like weight lifting, and some form of aerobic exercise, like running or  swimming.  Drinking plenty of water or other low-calorie or no-calorie drinks. Drink 6-8 glasses of water daily, or as much as instructed by your health care provider.  Summary  Quitting smoking is a physical and mental challenge. You will face cravings, withdrawal symptoms, and temptation to smoke again. Preparation can help you as you go through these challenges.  You can cope with cravings by keeping your mouth busy (such as by chewing gum), keeping your body and hands busy, and making calls to family, friends, or a helpline for people who want to quit smoking.  You can cope with withdrawal symptoms by avoiding places where people smoke, avoiding drinks with caffeine, and getting plenty of rest.  Ask your health care provider about the different ways to prevent weight gain, avoid stress, and handle social situations. This information is not intended to replace advice given to you by your health care provider. Make sure you discuss any questions you have with your health care provider. Document Released: 03/30/2016 Document Revised: 03/30/2016 Document Reviewed: 03/30/2016 Elsevier Interactive Patient Education  2018 Elsevier [employer] Chronic Obstructive Pulmonary Disease Chronic obstructive pulmonary disease (COPD) is a long-term (chronic) lung problem. When you have COPD, it is hard for air to get in and out of your lungs. The way your lungs work will never return to normal. Usually the condition gets worse over time. There are things you can do to keep yourself as healthy as possible. Your doctor may treat your condition with:  Medicines.  Quitting smoking, if you smoke.  Rehabilitation. This may involve a team of specialists.  Oxygen.  Exercise and changes to your diet.  Lung surgery.  Comfort measures (palliative care).  Follow these instructions at home: Medicines  Take over-the-counter and prescription medicines only as told by your doctor.  Talk to your doctor before  taking any cough or allergy medicines. You may need to avoid medicines that cause your lungs to be dry. Lifestyle  If you smoke, stop. Smoking makes the problem worse. If you need help quitting, ask your doctor.  Avoid being around things that make your breathing worse. This may include smoke, chemicals, and fumes.  Stay active, but remember to also rest.  Learn and use tips on how to relax.  Make sure you get enough sleep. Most adults need at least 7 hours a night.  Eat healthy foods. Eat smaller meals more often. Rest before meals. Controlled breathing  Learn and use tips on how to control your breathing as told by your doctor. Try: ? Breathing in (inhaling) through your nose for 1 second. Then, pucker your lips and breath out (exhale) through your lips for 2 seconds. ? Putting one hand on your belly (abdomen). Breathe in slowly through your nose for 1 second. Your hand on your belly should move out. Pucker your lips and breathe out slowly through your lips. Your hand on your belly should move in as you breathe out. Controlled coughing  Learn and use controlled coughing   to clear mucus from your lungs. The steps are: 1. Lean your head a little forward. 2. Breathe in deeply. 3. Try to hold your breath for 3 seconds. 4. Keep your mouth slightly open while coughing 2 times. 5. Spit any mucus out into a tissue. 6. Rest and do the steps again 1 or 2 times as needed. General instructions  Make sure you get all the shots (vaccines) that your doctor recommends. Ask your doctor about a flu shot and a pneumonia shot.  Use oxygen therapy and therapy to help improve your lungs (pulmonary rehabilitation) if told by your doctor. If you need home oxygen therapy, ask your doctor if you should buy a tool to measure your oxygen level (oximeter).  Make a COPD action plan with your doctor. This helps you know what to do if you feel worse than usual.  Manage any other conditions you have as told by  your doctor.  Avoid going outside when it is very hot, cold, or humid.  Avoid people who have a sickness you can catch (contagious).  Keep all follow-up visits as told by your doctor. This is important. Contact a doctor if:  You cough up more mucus than usual.  There is a change in the color or thickness of the mucus.  It is harder to breathe than usual.  Your breathing is faster than usual.  You have trouble sleeping.  You need to use your medicines more often than usual.  You have trouble doing your normal activities such as getting dressed or walking around the house. Get help right away if:  You have shortness of breath while resting.  You have shortness of breath that stops you from: ? Being able to talk. ? Doing normal activities.  Your chest hurts for longer than 5 minutes.  Your skin color is more blue than usual.  Your pulse oximeter shows that you have low oxygen for longer than 5 minutes.  You have a fever.  You feel too tired to breathe normally. Summary  Chronic obstructive pulmonary disease (COPD) is a long-term lung problem.  The way your lungs work will never return to normal. Usually the condition gets worse over time. There are things you can do to keep yourself as healthy as possible.  Take over-the-counter and prescription medicines only as told by your doctor.  If you smoke, stop. Smoking makes the problem worse. This information is not intended to replace advice given to you by your health care provider. Make sure you discuss any questions you have with your health care provider. Document Released: 09/19/2007 Document Revised: 09/08/2015 Document Reviewed: 11/27/2012 Elsevier Interactive Patient Education  2017 Elsevier Inc.  

## 2017-11-20 ENCOUNTER — Other Ambulatory Visit: Payer: Self-pay | Admitting: Internal Medicine

## 2017-12-30 ENCOUNTER — Other Ambulatory Visit: Payer: Self-pay | Admitting: Internal Medicine

## 2018-01-01 ENCOUNTER — Telehealth: Payer: Self-pay

## 2018-01-01 NOTE — Telephone Encounter (Signed)
Patient dropped off paperwork for a placard with dmv and lab screening form for smoking cessation...form given to adam

## 2018-01-02 ENCOUNTER — Telehealth: Payer: Self-pay

## 2018-01-02 NOTE — Telephone Encounter (Signed)
Patient advised paperwork is ready for pickup.tat

## 2018-01-27 ENCOUNTER — Other Ambulatory Visit: Payer: Self-pay | Admitting: Internal Medicine

## 2018-01-31 ENCOUNTER — Telehealth: Payer: Self-pay | Admitting: Internal Medicine

## 2018-01-31 NOTE — Telephone Encounter (Signed)
Prior auth for chantix has been denied , insurance only allows a certain amount for a year, patient has reached quantity amount for year

## 2018-03-04 ENCOUNTER — Ambulatory Visit: Payer: Self-pay | Admitting: Internal Medicine

## 2018-03-11 ENCOUNTER — Ambulatory Visit: Payer: Managed Care, Other (non HMO) | Admitting: Internal Medicine

## 2018-03-11 ENCOUNTER — Encounter: Payer: Self-pay | Admitting: Internal Medicine

## 2018-03-11 VITALS — BP 88/72 | HR 93 | Resp 16 | Ht 62.0 in | Wt 170.0 lb

## 2018-03-11 DIAGNOSIS — F17219 Nicotine dependence, cigarettes, with unspecified nicotine-induced disorders: Secondary | ICD-10-CM | POA: Diagnosis not present

## 2018-03-11 DIAGNOSIS — R0602 Shortness of breath: Secondary | ICD-10-CM | POA: Diagnosis not present

## 2018-03-11 DIAGNOSIS — J449 Chronic obstructive pulmonary disease, unspecified: Secondary | ICD-10-CM

## 2018-03-11 NOTE — Progress Notes (Signed)
[facility] [address] [place], [place] [address]  Pulmonary Sleep Medicine   Office Visit Note  Patient Name: Kristi Walter DOB: Jun 25, 1960 MRN [id number]  Date of Service: 03/11/2018  Complaints/HPI: PT is here for follow up on COPD, acute respiratory failure with hypoxemia, and asthma.  Patient is currently oxygen dependent at night.  She is here today for a 6-minute walk to see if she qualifies for oxygen during the day.  It is time to renew her nighttime oxygen, and her DME company has told her she needs to have the paperwork completed.  She reports good results from using oxygen at night.  She sleeps well and denies any headaches or other issues when waking up in the morning.  ROS  General: (-) fever, (-) chills, (-) night sweats, (-) weakness Skin: (-) rashes, (-) itching,. Eyes: (-) visual changes, (-) redness, (-) itching. Nose and Sinuses: (-) nasal stuffiness or itchiness, (-) postnasal drip, (-) nosebleeds, (-) sinus trouble. Mouth and Throat: (-) sore throat, (-) hoarseness. Neck: (-) swollen glands, (-) enlarged thyroid, (-) neck pain. Respiratory: - cough, (-) bloody sputum, - shortness of breath, - wheezing. Cardiovascular: - ankle swelling, (-) chest pain. Lymphatic: (-) lymph node enlargement. Neurologic: (-) numbness, (-) tingling. Psychiatric: (-) anxiety, (-) depression   Current Medication: Outpatient Encounter Medications as of 03/11/2018  Medication Sig  . albuterol (PROAIR HFA) 108 (90 Base) MCG/ACT inhaler Inhale 1-2 puffs into the lungs every 4 (four) hours as needed for wheezing or shortness of breath.  . benzonatate (TESSALON) 100 MG capsule TAKE 1 CAPSULE BY MOUTH  EVERY 12 HOURS AS NEEDED  FOR COUGH  . budesonide-formoterol (SYMBICORT) 160-4.5 MCG/ACT inhaler Inhale 2 puffs into the lungs 2 (two) times daily.   Rhea Bleacher CONTINUING MONTH PAK 1 MG tablet TAKE AS DIRECTED  . EPINEPHrine (EPIPEN 2-PAK) 0.3 mg/0.3 mL IJ SOAJ injection  Inject 0.3 mg into the muscle once.  Marland Kitchen guaiFENesin (ROBITUSSIN) 100 MG/5ML SOLN Take 5 mLs (100 mg total) by mouth every 4 (four) hours as needed for cough or to loosen phlegm.  Marland Kitchen ipratropium-albuterol (DUONEB) 0.5-2.5 (3) MG/3ML SOLN Take 3 mLs by nebulization every 6 (six) hours as needed (for shortness of breath or wheezing).   Marland Kitchen levofloxacin (LEVAQUIN) 500 MG tablet Take 1 tablet (500 mg total) by mouth daily.  . montelukast (SINGULAIR) 10 MG tablet Take 10 mg by mouth daily.  Marland Kitchen omeprazole (PRILOSEC) 20 MG capsule Take 20 mg by mouth daily.   . predniSONE (DELTASONE) 10 MG tablet Use per dose pack  . theophylline (THEO-24) 300 MG 24 hr capsule Take 1 capsule (300 mg total) by mouth daily.  Marland Kitchen tiotropium (SPIRIVA) 18 MCG inhalation capsule Place 18 mcg into inhaler and inhale 2 (two) times daily.    No facility-administered encounter medications on file as of 03/11/2018.     Surgical History: Past Surgical History:  Procedure Laterality Date  . CATARACT EXTRACTION W/PHACO Left 06/04/2017   Procedure: CATARACT EXTRACTION PHACO AND INTRAOCULAR LENS PLACEMENT (IOC);  Surgeon: Galen Manila, MD;  Location: [facility] ORS;  Service: Ophthalmology;  Laterality: Left;  [place] 00:58.5 AP% 22.3 CDE 13.03 Fluid Pack Lot # [id number]  . CESAREAN SECTION    . EYE SURGERY    . FOOT SURGERY    . pyloric     stenosis surgery  . THROAT SURGERY    . VAGINAL HYSTERECTOMY  1989    Medical History: Past Medical History:  Diagnosis Date  . Asthma   .  COPD (chronic obstructive pulmonary disease) (HCC)   . Dyspnea   . GERD (gastroesophageal reflux disease)   . Orthopnea   . Pneumonia   . Wheezing     Family History: Family History  Problem Relation Age of Onset  . Hyperlipidemia Mother   . Hypertension Mother   . Stroke Mother     Social History: Social History   Socioeconomic History  . Marital status: Married    Spouse name: Not on file  . Number of children: Not on file  . Years of  education: Not on file  . Highest education level: Not on file  Occupational History  . Not on file  Social Needs  . Financial resource strain: Not on file  . Food insecurity:    Worry: Not on file    Inability: Not on file  . Transportation needs:    Medical: Not on file    Non-medical: Not on file  Tobacco Use  . Smoking status: Former Smoker    Packs/day: 0.25    Years: 30.00    Pack years: 7.50    Types: Cigarettes    Last attempt to quit: 07/30/2017    Years since quitting: 0.6  . Smokeless tobacco: Never Used  Substance and Sexual Activity  . Alcohol use: No    Frequency: Never  . Drug use: No  . Sexual activity: Not on file  Lifestyle  . Physical activity:    Days per week: Not on file    Minutes per session: Not on file  . Stress: Not on file  Relationships  . Social connections:    Talks on phone: Not on file    Gets together: Not on file    Attends religious service: Not on file    Active member of club or organization: Not on file    Attends meetings of clubs or organizations: Not on file    Relationship status: Not on file  . Intimate partner violence:    Fear of current or ex partner: Not on file    Emotionally abused: Not on file    Physically abused: Not on file    Forced sexual activity: Not on file  Other Topics Concern  . Not on file  Social History Narrative  . Not on file    Vital Signs: Blood pressure (!) 88/72, pulse 93, resp. rate 16, height 5\' 2"  (1.575 m), weight 170 lb (77.1 kg), SpO2 95 %.  Examination: General Appearance: The patient is well-developed, well-nourished, and in no distress. Skin: Gross inspection of skin unremarkable. Head: normocephalic, no gross deformities. Eyes: no gross deformities noted. ENT: ears appear grossly normal no exudates. Neck: Supple. No thyromegaly. No LAD. Respiratory: clear bilateraly. Cardiovascular: Normal S1 and S2 without murmur or rub. Extremities: No cyanosis. pulses are  equal. Neurologic: Alert and oriented. No involuntary movements.  LABS: No results found for this or any previous visit (from the past 2160 hour(s)).  Radiology: Dg Chest 2 View  Result Date: 10/30/2017 CLINICAL DATA:  Cough, wheezing, and shortness of breath for 2 weeks, history COPD, asthma, recently stopped smoking, history GERD EXAM: CHEST - 2 VIEW COMPARISON:  01/10/2017 FINDINGS: Borderline enlargement of cardiac silhouette. Mediastinal contours and pulmonary vascularity normal. Bronchitic changes and hyperinflation consistent with history of COPD. No pulmonary infiltrate, pleural effusion, or pneumothorax. Bones demineralized. IMPRESSION: COPD changes without infiltrate. Electronically Signed   By: Ulyses SouthwardMark  Boles M.D.   On: 10/30/2017 08:49    No results found.  No  results found.    Assessment and Plan: Patient Active Problem List   Diagnosis Date Noted  . Acute respiratory failure with hypoxemia (HCC) 05/24/2016  . SVT (supraventricular tachycardia) (HCC) 04/27/2014  . Gastro-esophageal reflux disease without esophagitis 04/26/2014  . COPD with asthma (HCC) 04/26/2014  . Low back sprain 09/15/2013  . Current tobacco use 03/06/2013  . Allergic rhinitis 07/13/2010  . Allergic to food 07/13/2010  . Chronic obstructive pulmonary disease (HCC) 08/14/2000   1. Chronic obstructive pulmonary disease, unspecified COPD type (HCC) Patient COPD is stable at this time.  The need for overnight oxygen will be assessed in a new overnight pulse oximetry. - Pulse oximetry, overnight; Future  2. Cigarette nicotine dependence with nicotine-induced disorder Encourage patient to stop smoking again this visit. Smoking cessation counseling: 1. Pt acknowledges the risks of long term smoking, she will try to quite smoking. 2. Options for different medications including nicotine products, chewing gum, patch etc, Wellbutrin and Chantix is discussed 3. Goal and date of compete cessation is  discussed 4. Total time spent in smoking cessation is 15 min.  3. SOB (shortness of breath) Her spirometry is essentially unchanged since July 2019.  Patient was given 6-minute walk at today's visit and while she became winded and had to stop her O2 sat did not drop.  See flow sheet for details. - Spirometry with Graph - 6 minute walk  General Counseling: I have discussed the findings of the evaluation and examination with Danelly.  I have also discussed any further diagnostic evaluation thatmay be needed or ordered today. Natashia verbalizes understanding of the findings of todays visit. We also reviewed her medications today and discussed drug interactions and side effects including but not limited excessive drowsiness and altered mental states. We also discussed that there is always a risk not just to her but also people around her. she has been encouraged to call the office with any questions or concerns that should arise related to todays visit.    Time spent: 25 This patient was seen by Blima Ledger AGNP-C in Collaboration with Dr. Freda Munro as a part of collaborative care agreement.   I have personally obtained a history, examined the patient, evaluated laboratory and imaging results, formulated the assessment and plan and placed orders.    Yevonne Pax, MD [facility] Sleep medicine

## 2018-03-11 NOTE — Patient Instructions (Signed)

## 2018-03-17 ENCOUNTER — Telehealth: Payer: Self-pay | Admitting: Internal Medicine

## 2018-03-17 NOTE — Telephone Encounter (Signed)
Gave order to [facility] for overnight ox test and put copy in scan (to be scanned and kept ), Graybar ElectricBeth

## 2018-04-16 HISTORY — PX: CATARACT EXTRACTION W/PHACO: SHX586

## 2018-04-23 ENCOUNTER — Encounter: Payer: Self-pay | Admitting: Adult Health

## 2018-05-29 ENCOUNTER — Other Ambulatory Visit: Payer: Self-pay

## 2018-05-29 MED ORDER — ALBUTEROL SULFATE HFA 108 (90 BASE) MCG/ACT IN AERS: 1.0000 | INHALATION_SPRAY | RESPIRATORY_TRACT | 3 refills | Status: DC | PRN

## 2018-05-29 MED ORDER — BUDESONIDE-FORMOTEROL FUMARATE 160-4.5 MCG/ACT IN AERO: 2.0000 | INHALATION_SPRAY | Freq: Two times a day (BID) | RESPIRATORY_TRACT | 3 refills | Status: DC

## 2018-06-04 ENCOUNTER — Telehealth: Payer: Self-pay | Admitting: Adult Health

## 2018-06-17 ENCOUNTER — Other Ambulatory Visit: Payer: Self-pay | Admitting: Adult Health

## 2018-06-17 ENCOUNTER — Encounter: Payer: Self-pay | Admitting: Internal Medicine

## 2018-06-17 ENCOUNTER — Ambulatory Visit: Payer: Managed Care, Other (non HMO) | Admitting: Internal Medicine

## 2018-06-17 VITALS — BP 110/72 | HR 93 | Resp 16 | Ht 62.0 in | Wt 175.0 lb

## 2018-06-17 DIAGNOSIS — J9601 Acute respiratory failure with hypoxia: Secondary | ICD-10-CM

## 2018-06-17 DIAGNOSIS — R0602 Shortness of breath: Secondary | ICD-10-CM | POA: Diagnosis not present

## 2018-06-17 DIAGNOSIS — J449 Chronic obstructive pulmonary disease, unspecified: Secondary | ICD-10-CM

## 2018-06-17 DIAGNOSIS — K219 Gastro-esophageal reflux disease without esophagitis: Secondary | ICD-10-CM

## 2018-06-17 MED ORDER — OMEPRAZOLE 20 MG PO CPDR: 20.0000 mg | DELAYED_RELEASE_CAPSULE | Freq: Every day | ORAL | 2 refills | Status: DC

## 2018-06-17 MED ORDER — IPRATROPIUM-ALBUTEROL 0.5-2.5 (3) MG/3ML IN SOLN: 3.0000 mL | Freq: Four times a day (QID) | RESPIRATORY_TRACT | 11 refills | Status: DC | PRN

## 2018-06-17 MED ORDER — ALBUTEROL SULFATE HFA 108 (90 BASE) MCG/ACT IN AERS: 1.0000 | INHALATION_SPRAY | RESPIRATORY_TRACT | 3 refills | Status: DC | PRN

## 2018-06-17 MED ORDER — TIOTROPIUM BROMIDE MONOHYDRATE 18 MCG IN CAPS: 18.0000 ug | ORAL_CAPSULE | Freq: Two times a day (BID) | RESPIRATORY_TRACT | 2 refills | Status: DC

## 2018-06-17 MED ORDER — BUDESONIDE-FORMOTEROL FUMARATE 160-4.5 MCG/ACT IN AERO: 2.0000 | INHALATION_SPRAY | Freq: Two times a day (BID) | RESPIRATORY_TRACT | 3 refills | Status: DC

## 2018-06-17 NOTE — Progress Notes (Signed)
[facility] [address] [place], [place] [address]  Pulmonary Sleep Medicine   Office Visit Note  Patient Name: Kristi Walter DOB: [birth year] MRN [id number]  Date of Service: 06/17/2018  Complaints/HPI: Pt here for follow up on copd, acute resp failure with hypoxia, and nocturnal hypoxia.  Pt reports overall she has been doing well.  She has been sick once in [place].  She went to the walk in clinic.  She received antibiotics and steroids.  She got better in 2 weeks.  She reports she is using her medication, and inhalers as directed.  She denies any chest pain, sob, palpitations or sinus issues.    ROS  General: (-) fever, (-) chills, (-) night sweats, (-) weakness Skin: (-) rashes, (-) itching,. Eyes: (-) visual changes, (-) redness, (-) itching. Nose and Sinuses: (-) nasal stuffiness or itchiness, (-) postnasal drip, (-) nosebleeds, (-) sinus trouble. Mouth and Throat: (-) sore throat, (-) hoarseness. Neck: (-) swollen glands, (-) enlarged thyroid, (-) neck pain. Respiratory: - cough, (-) bloody sputum, - shortness of breath, - wheezing. Cardiovascular: - ankle swelling, (-) chest pain. Lymphatic: (-) lymph node enlargement. Neurologic: (-) numbness, (-) tingling. Psychiatric: (-) anxiety, (-) depression   Current Medication: Outpatient Encounter Medications as of 06/17/2018  Medication Sig  . benzonatate (TESSALON) 100 MG capsule TAKE 1 CAPSULE BY MOUTH  EVERY 12 HOURS AS NEEDED  FOR COUGH  . CHANTIX CONTINUING MONTH PAK 1 MG tablet TAKE AS DIRECTED  . EPINEPHrine (EPIPEN 2-PAK) 0.3 mg/0.3 mL IJ SOAJ injection Inject 0.3 mg into the muscle once.  Marland Kitchen guaiFENesin (ROBITUSSIN) 100 MG/5ML SOLN Take 5 mLs (100 mg total) by mouth every 4 (four) hours as needed for cough or to loosen phlegm.  Marland Kitchen levofloxacin (LEVAQUIN) 500 MG tablet Take 1 tablet (500 mg total) by mouth daily.  . montelukast (SINGULAIR) 10 MG tablet Take 10 mg by mouth daily.  . predniSONE (DELTASONE) 10 MG  tablet Use per dose pack  . theophylline (THEO-24) 300 MG 24 hr capsule Take 1 capsule (300 mg total) by mouth daily.  . [DISCONTINUED] albuterol (PROAIR HFA) 108 (90 Base) MCG/ACT inhaler Inhale 1-2 puffs into the lungs every 4 (four) hours as needed for wheezing or shortness of breath.  . [DISCONTINUED] budesonide-formoterol (SYMBICORT) 160-4.5 MCG/ACT inhaler Inhale 2 puffs into the lungs 2 (two) times daily.  . [DISCONTINUED] ipratropium-albuterol (DUONEB) 0.5-2.5 (3) MG/3ML SOLN Take 3 mLs by nebulization every 6 (six) hours as needed (for shortness of breath or wheezing).   . [DISCONTINUED] omeprazole (PRILOSEC) 20 MG capsule Take 20 mg by mouth daily.   . [DISCONTINUED] tiotropium (SPIRIVA) 18 MCG inhalation capsule Place 18 mcg into inhaler and inhale 2 (two) times daily.    No facility-administered encounter medications on file as of 06/17/2018.     Surgical History: Past Surgical History:  Procedure Laterality Date  . CATARACT EXTRACTION W/PHACO Left 06/04/2017   Procedure: CATARACT EXTRACTION PHACO AND INTRAOCULAR LENS PLACEMENT (IOC);  Surgeon: Galen Manila, MD;  Location: [facility] ORS;  Service: Ophthalmology;  Laterality: Left;  [place] 00:58.5 AP% 22.3 CDE 13.03 Fluid Pack Lot # [id number]  . CESAREAN SECTION    . EYE SURGERY    . FOOT SURGERY    . pyloric     stenosis surgery  . THROAT SURGERY    . VAGINAL HYSTERECTOMY  1989    Medical History: Past Medical History:  Diagnosis Date  . Asthma   . COPD (chronic obstructive pulmonary disease) (HCC)   .  Dyspnea   . GERD (gastroesophageal reflux disease)   . Orthopnea   . Pneumonia   . Wheezing     Family History: Family History  Problem Relation Age of Onset  . Hyperlipidemia Mother   . Hypertension Mother   . Stroke Mother     Social History: Social History   Socioeconomic History  . Marital status: Married    Spouse name: Not on file  . Number of children: Not on file  . Years of education: Not on file  .  Highest education level: Not on file  Occupational History  . Not on file  Social Needs  . Financial resource strain: Not on file  . Food insecurity:    Worry: Not on file    Inability: Not on file  . Transportation needs:    Medical: Not on file    Non-medical: Not on file  Tobacco Use  . Smoking status: Former Smoker    Packs/day: 0.25    Years: 30.00    Pack years: 7.50    Types: Cigarettes    Last attempt to quit: 07/30/2017    Years since quitting: 0.8  . Smokeless tobacco: Never Used  Substance and Sexual Activity  . Alcohol use: No    Frequency: Never  . Drug use: No  . Sexual activity: Not on file  Lifestyle  . Physical activity:    Days per week: Not on file    Minutes per session: Not on file  . Stress: Not on file  Relationships  . Social connections:    Talks on phone: Not on file    Gets together: Not on file    Attends religious service: Not on file    Active member of club or organization: Not on file    Attends meetings of clubs or organizations: Not on file    Relationship status: Not on file  . Intimate partner violence:    Fear of current or ex partner: Not on file    Emotionally abused: Not on file    Physically abused: Not on file    Forced sexual activity: Not on file  Other Topics Concern  . Not on file  Social History Narrative  . Not on file    Vital Signs: Blood pressure 110/72, pulse 93, resp. rate 16, height 5\' 2"  (1.575 m), weight 175 lb (79.4 kg), SpO2 95 %.  Examination: General Appearance: The patient is well-developed, well-nourished, and in no distress. Skin: Gross inspection of skin unremarkable. Head: normocephalic, no gross deformities. Eyes: no gross deformities noted. ENT: ears appear grossly normal no exudates. Neck: Supple. No thyromegaly. No LAD. Respiratory: clear bilaterally. Cardiovascular: Normal S1 and S2 without murmur or rub. Extremities: No cyanosis. pulses are equal. Neurologic: Alert and oriented. No  involuntary movements.  LABS: No results found for this or any previous visit (from the past 2160 hour(s)).  Radiology: Dg Chest 2 View  Result Date: 10/30/2017 CLINICAL DATA:  Cough, wheezing, and shortness of breath for 2 weeks, history COPD, asthma, recently stopped smoking, history GERD EXAM: CHEST - 2 VIEW COMPARISON:  01/10/2017 FINDINGS: Borderline enlargement of cardiac silhouette. Mediastinal contours and pulmonary vascularity normal. Bronchitic changes and hyperinflation consistent with history of COPD. No pulmonary infiltrate, pleural effusion, or pneumothorax. Bones demineralized. IMPRESSION: COPD changes without infiltrate. Electronically Signed   By: Ulyses Southward M.D.   On: 10/30/2017 08:49    No results found.  No results found.    Assessment and Plan: Patient Active  Problem List   Diagnosis Date Noted  . Acute respiratory failure with hypoxemia (HCC) 05/24/2016  . SVT (supraventricular tachycardia) (HCC) 04/27/2014  . Gastro-esophageal reflux disease without esophagitis 04/26/2014  . COPD with asthma (HCC) 04/26/2014  . Low back sprain 09/15/2013  . Current tobacco use 03/06/2013  . Allergic rhinitis 07/13/2010  . Allergic to food 07/13/2010  . Chronic obstructive pulmonary disease (HCC) 08/14/2000    1. Chronic obstructive pulmonary disease, unspecified COPD type (HCC) Pt will continue to use medications as directed.  Will get pft as spiro seems to be declining.    - Pulmonary Function Test; Future  2. Acute respiratory failure with hypoxemia (HCC) Continue to use oxygen at night as directed.  2 liters via [place].  3. Gastro-esophageal reflux disease without esophagitis Stable, continue current medications as directed.   4. SOB (shortness of breath) FVC is 1.3 which is 40% of the pre-predicted value FEV1 is 0.6 which is 24% of the pre-predicted value FEV1/FVC is 47% which is 59% of the pre-predicted value on today's spirometry. - Spirometry with  graph  General Counseling: I have discussed the findings of the evaluation and examination with Rajni.  I have also discussed any further diagnostic evaluation thatmay be needed or ordered today. Rhema verbalizes understanding of the findings of todays visit. We also reviewed her medications today and discussed drug interactions and side effects including but not limited excessive drowsiness and altered mental states. We also discussed that there is always a risk not just to her but also people around her. she has been encouraged to call the office with any questions or concerns that should arise related to todays visit.    Time spent:  25 This patient was seen by Blima Ledger AGNP-C in Collaboration with Dr. Freda Munro as a part of collaborative care agreement.   I have personally obtained a history, examined the patient, evaluated laboratory and imaging results, formulated the assessment and plan and placed orders.    Yevonne Pax, MD [facility] Pulmonary and Critical Care Sleep medicine

## 2018-06-17 NOTE — Patient Instructions (Signed)
Chronic Obstructive Pulmonary Disease Chronic obstructive pulmonary disease (COPD) is a long-term (chronic) lung problem. When you have COPD, it is hard for air to get in and out of your lungs. Usually the condition gets worse over time, and your lungs will never return to normal. There are things you can do to keep yourself as healthy as possible.  Your doctor may treat your condition with: ? Medicines. ? Oxygen. ? Lung surgery.  Your doctor may also recommend: ? Rehabilitation. This includes steps to make your body work better. It may involve a team of specialists. ? Quitting smoking, if you smoke. ? Exercise and changes to your diet. ? Comfort measures (palliative care). Follow these instructions at home: Medicines  Take over-the-counter and prescription medicines only as told by your doctor.  Talk to your doctor before taking any cough or allergy medicines. You may need to avoid medicines that cause your lungs to be dry. Lifestyle  If you smoke, stop. Smoking makes the problem worse. If you need help quitting, ask your doctor.  Avoid being around things that make your breathing worse. This may include smoke, chemicals, and fumes.  Stay active, but remember to rest as well.  Learn and use tips on how to relax.  Make sure you get enough sleep. Most adults need at least 7 hours of sleep every night.  Eat healthy foods. Eat smaller meals more often. Rest before meals. Controlled breathing Learn and use tips on how to control your breathing as told by your doctor. Try:  Breathing in (inhaling) through your nose for 1 second. Then, pucker your lips and breath out (exhale) through your lips for 2 seconds.  Putting one hand on your belly (abdomen). Breathe in slowly through your nose for 1 second. Your hand on your belly should move out. Pucker your lips and breathe out slowly through your lips. Your hand on your belly should move in as you breathe out.  Controlled coughing Learn  and use controlled coughing to clear mucus from your lungs. Follow these steps: 1. Lean your head a little forward. 2. Breathe in deeply. 3. Try to hold your breath for 3 seconds. 4. Keep your mouth slightly open while coughing 2 times. 5. Spit any mucus out into a tissue. 6. Rest and do the steps again 1 or 2 times as needed. General instructions  Make sure you get all the shots (vaccines) that your doctor recommends. Ask your doctor about a flu shot and a pneumonia shot.  Use oxygen therapy and pulmonary rehabilitation if told by your doctor. If you need home oxygen therapy, ask your doctor if you should buy a tool to measure your oxygen level (oximeter).  Make a COPD action plan with your doctor. This helps you to know what to do if you feel worse than usual.  Manage any other conditions you have as told by your doctor.  Avoid going outside when it is very hot, cold, or humid.  Avoid people who have a sickness you can catch (contagious).  Keep all follow-up visits as told by your doctor. This is important. Contact a doctor if:  You cough up more mucus than usual.  There is a change in the color or thickness of the mucus.  It is harder to breathe than usual.  Your breathing is faster than usual.  You have trouble sleeping.  You need to use your medicines more often than usual.  You have trouble doing your normal activities such as getting dressed   or walking around the house. Get help right away if:  You have shortness of breath while resting.  You have shortness of breath that stops you from: ? Being able to talk. ? Doing normal activities.  Your chest hurts for longer than 5 minutes.  Your skin color is more blue than usual.  Your pulse oximeter shows that you have low oxygen for longer than 5 minutes.  You have a fever.  You feel too tired to breathe normally. Summary  Chronic obstructive pulmonary disease (COPD) is a long-term lung problem.  The way your  lungs work will never return to normal. Usually the condition gets worse over time. There are things you can do to keep yourself as healthy as possible.  Take over-the-counter and prescription medicines only as told by your doctor.  If you smoke, stop. Smoking makes the problem worse. This information is not intended to replace advice given to you by your health care provider. Make sure you discuss any questions you have with your health care provider. Document Released: 09/19/2007 Document Revised: 05/07/2016 Document Reviewed: 05/07/2016 Elsevier Interactive Patient Education  2019 Elsevier Inc.  

## 2018-06-23 ENCOUNTER — Other Ambulatory Visit: Payer: Self-pay | Admitting: Internal Medicine

## 2018-06-23 MED ORDER — TIOTROPIUM BROMIDE MONOHYDRATE 18 MCG IN CAPS: ORAL_CAPSULE | RESPIRATORY_TRACT | 2 refills | Status: DC

## 2018-06-25 ENCOUNTER — Ambulatory Visit (INDEPENDENT_AMBULATORY_CARE_PROVIDER_SITE_OTHER): Payer: Managed Care, Other (non HMO) | Admitting: Internal Medicine

## 2018-06-25 DIAGNOSIS — R0602 Shortness of breath: Secondary | ICD-10-CM | POA: Diagnosis not present

## 2018-06-25 LAB — PULMONARY FUNCTION TEST

## 2018-06-30 NOTE — Procedures (Signed)
[facility] [address] [place], [address]  DATE OF SERVICE: June 25, 2018  Complete Pulmonary Function Testing Interpretation:  FINDINGS:  The forced vital capacity is severely decreased at 0.99 L which is 33% of predicted.  The FEV1 is severely decreased at 0.57 liters which is 24% of predicted.  Postbronchodilator no significant change in the FEV1.  FEV1 FVC ratio is moderately decreased.  Total lung capacity is mildly decreased residual volume is increased residual volume total lung capacity ratio is increased.  DLCO maneuver was attempted however patient had difficulty performing the maneuver.  IMPRESSION:  This pulmonary function study is consistent with severe obstructive lung disease.  Patient had difficulty performing the maneuver for DLCO so therefore could not be done  Yevonne Pax, MD [facility] Pulmonary Critical Care Medicine Sleep Medicine

## 2018-07-20 ENCOUNTER — Other Ambulatory Visit: Payer: Self-pay | Admitting: Internal Medicine

## 2018-07-29 ENCOUNTER — Other Ambulatory Visit: Payer: Self-pay

## 2018-07-29 ENCOUNTER — Encounter: Payer: Self-pay | Admitting: Internal Medicine

## 2018-07-29 ENCOUNTER — Ambulatory Visit: Payer: Managed Care, Other (non HMO) | Admitting: Internal Medicine

## 2018-07-29 VITALS — Ht 62.5 in | Wt 170.0 lb

## 2018-07-29 DIAGNOSIS — Z87891 Personal history of nicotine dependence: Secondary | ICD-10-CM | POA: Diagnosis not present

## 2018-07-29 DIAGNOSIS — J9601 Acute respiratory failure with hypoxia: Secondary | ICD-10-CM

## 2018-07-29 DIAGNOSIS — J449 Chronic obstructive pulmonary disease, unspecified: Secondary | ICD-10-CM | POA: Diagnosis not present

## 2018-07-29 MED ORDER — MONTELUKAST SODIUM 10 MG PO TABS: ORAL_TABLET | ORAL | 1 refills | Status: DC

## 2018-07-29 NOTE — Progress Notes (Signed)
[facility] [address] [place], [place] [address]  Internal MEDICINE  Telephone Visit  Patient Name: Kristi Walter  [id number]  [id number]  Date of Service: 07/29/2018  I connected with the patient at 320 by telephone and verified the patients identity using two identifiers.   I discussed the limitations, risks, security and privacy concerns of performing an evaluation and management service by telephone and the availability of in person appointments. I also discussed with the patient that there may be a patient responsible charge related to the service.  The patient expressed understanding and agrees to proceed.    Chief Complaint  Patient presents with  . Medical Management of Chronic Issues    VIDEO VISIT, 6wk follow up  . Labs Only    PFT Results    HPI  Pt is following up with her PFT results.  She reports she has been doing well.  She is staying inside due to COVID-19. She has a history of COPD, and asthma. Denies Chest pain, Shortness of breath, palpitations, headache, or blurred vision.  She denies any other needs.    Current Medication: Outpatient Encounter Medications as of 07/29/2018  Medication Sig  . albuterol (PROAIR HFA) 108 (90 Base) MCG/ACT inhaler Inhale 1-2 puffs into the lungs every 4 (four) hours as needed for wheezing or shortness of breath.  . benzonatate (TESSALON) 100 MG capsule TAKE 1 CAPSULE BY MOUTH  EVERY 12 HOURS AS NEEDED  FOR COUGH  . budesonide-formoterol (SYMBICORT) 160-4.5 MCG/ACT inhaler Inhale 2 puffs into the lungs 2 (two) times daily.  Rhea Bleacher CONTINUING MONTH PAK 1 MG tablet TAKE AS DIRECTED  . EPINEPHrine (EPIPEN 2-PAK) 0.3 mg/0.3 mL IJ SOAJ injection Inject 0.3 mg into the muscle once.  Marland Kitchen guaiFENesin (ROBITUSSIN) 100 MG/5ML SOLN Take 5 mLs (100 mg total) by mouth every 4 (four) hours as needed for cough or to loosen phlegm.  Marland Kitchen ipratropium-albuterol (DUONEB) 0.5-2.5 (3) MG/3ML SOLN Take 3 mLs by nebulization every 6 (six)  hours as needed (for shortness of breath or wheezing).  Marland Kitchen levofloxacin (LEVAQUIN) 500 MG tablet Take 1 tablet (500 mg total) by mouth daily.  Marland Kitchen omeprazole (PRILOSEC) 20 MG capsule Take 1 capsule (20 mg total) by mouth daily.  . predniSONE (DELTASONE) 10 MG tablet Use per dose pack  . THEO-24 300 MG 24 hr capsule TAKE 1 CAPSULE BY MOUTH  DAILY  . tiotropium (SPIRIVA) 18 MCG inhalation capsule 1 capsule into inhaler and inhale once daily  . [DISCONTINUED] montelukast (SINGULAIR) 10 MG tablet Take 10 mg by mouth daily.   No facility-administered encounter medications on file as of 07/29/2018.     Surgical History: Past Surgical History:  Procedure Laterality Date  . CATARACT EXTRACTION W/PHACO Left 06/04/2017   Procedure: CATARACT EXTRACTION PHACO AND INTRAOCULAR LENS PLACEMENT (IOC);  Surgeon: Galen Manila, MD;  Location: [facility] ORS;  Service: Ophthalmology;  Laterality: Left;  [place] 00:58.5 AP% 22.3 CDE 13.03 Fluid Pack Lot # [id number]  . CESAREAN SECTION    . EYE SURGERY    . FOOT SURGERY    . pyloric     stenosis surgery  . THROAT SURGERY    . VAGINAL HYSTERECTOMY  1989    Medical History: Past Medical History:  Diagnosis Date  . Asthma   . COPD (chronic obstructive pulmonary disease) (HCC)   . Dyspnea   . GERD (gastroesophageal reflux disease)   . Orthopnea   . Pneumonia   . Wheezing     Family  History: Family History  Problem Relation Age of Onset  . Hyperlipidemia Mother   . Hypertension Mother   . Stroke Mother     Social History   Socioeconomic History  . Marital status: Married    Spouse name: Not on file  . Number of children: Not on file  . Years of education: Not on file  . Highest education level: Not on file  Occupational History  . Not on file  Social Needs  . Financial resource strain: Not on file  . Food insecurity:    Worry: Not on file    Inability: Not on file  . Transportation needs:    Medical: Not on file    Non-medical: Not on file   Tobacco Use  . Smoking status: Former Smoker    Packs/day: 0.25    Years: 30.00    Pack years: 7.50    Types: Cigarettes    Last attempt to quit: 07/30/2017    Years since quitting: 0.9  . Smokeless tobacco: Never Used  Substance and Sexual Activity  . Alcohol use: No    Frequency: Never  . Drug use: No  . Sexual activity: Not on file  Lifestyle  . Physical activity:    Days per week: Not on file    Minutes per session: Not on file  . Stress: Not on file  Relationships  . Social connections:    Talks on phone: Not on file    Gets together: Not on file    Attends religious service: Not on file    Active member of club or organization: Not on file    Attends meetings of clubs or organizations: Not on file    Relationship status: Not on file  . Intimate partner violence:    Fear of current or ex partner: Not on file    Emotionally abused: Not on file    Physically abused: Not on file    Forced sexual activity: Not on file  Other Topics Concern  . Not on file  Social History Narrative  . Not on file      Review of Systems  Constitutional: Negative for chills, fatigue and unexpected weight change.  HENT: Negative for congestion, rhinorrhea, sneezing and sore throat.   Eyes: Negative for photophobia, pain and redness.  Respiratory: Negative for cough, chest tightness and shortness of breath.   Cardiovascular: Negative for chest pain and palpitations.  Gastrointestinal: Negative for abdominal pain, constipation, diarrhea, nausea and vomiting.  Endocrine: Negative.   Genitourinary: Negative for dysuria and frequency.  Musculoskeletal: Negative for arthralgias, back pain, joint swelling and neck pain.  Skin: Negative for rash.  Allergic/Immunologic: Negative.   Neurological: Negative for tremors and numbness.  Hematological: Negative for adenopathy. Does not bruise/bleed easily.  Psychiatric/Behavioral: Negative for behavioral problems and sleep disturbance. The patient  is not nervous/anxious.     Vital Signs: Ht 5' 2.5" (1.588 m)   Wt 170 lb (77.1 kg)   BMI 30.60 kg/m    Observation/Objective:  NAD noted.  Speaking in full sentences.  Pt smiling,and appears well.   Assessment/Plan: 1. Chronic obstructive pulmonary disease, unspecified COPD type (HCC) Stable, continue present management.  PFTs show severe disease.    2. Former tobacco use Smoke free for 11 months. She continues to use Chantix.   3. Acute respiratory failure with hypoxemia (HCC) Using oxygen 3lpm via at night.    General Counseling: Haely verbalizes understanding of the findings of today's phone visit and agrees with  plan of treatment. I have discussed any further diagnostic evaluation that may be needed or ordered today. We also reviewed her medications today. she has been encouraged to call the office with any questions or concerns that should arise related to todays visit.    No orders of the defined types were placed in this encounter.   No orders of the defined types were placed in this encounter.   Time spent: 11 Minutes    Blima Ledger AGNP-C Internal medicine

## 2018-07-29 NOTE — Patient Instructions (Signed)
Chronic Obstructive Pulmonary Disease Chronic obstructive pulmonary disease (COPD) is a long-term (chronic) lung problem. When you have COPD, it is hard for air to get in and out of your lungs. Usually the condition gets worse over time, and your lungs will never return to normal. There are things you can do to keep yourself as healthy as possible.  Your doctor may treat your condition with: ? Medicines. ? Oxygen. ? Lung surgery.  Your doctor may also recommend: ? Rehabilitation. This includes steps to make your body work better. It may involve a team of specialists. ? Quitting smoking, if you smoke. ? Exercise and changes to your diet. ? Comfort measures (palliative care). Follow these instructions at home: Medicines  Take over-the-counter and prescription medicines only as told by your doctor.  Talk to your doctor before taking any cough or allergy medicines. You may need to avoid medicines that cause your lungs to be dry. Lifestyle  If you smoke, stop. Smoking makes the problem worse. If you need help quitting, ask your doctor.  Avoid being around things that make your breathing worse. This may include smoke, chemicals, and fumes.  Stay active, but remember to rest as well.  Learn and use tips on how to relax.  Make sure you get enough sleep. Most adults need at least 7 hours of sleep every night.  Eat healthy foods. Eat smaller meals more often. Rest before meals. Controlled breathing Learn and use tips on how to control your breathing as told by your doctor. Try:  Breathing in (inhaling) through your nose for 1 second. Then, pucker your lips and breath out (exhale) through your lips for 2 seconds.  Putting one hand on your belly (abdomen). Breathe in slowly through your nose for 1 second. Your hand on your belly should move out. Pucker your lips and breathe out slowly through your lips. Your hand on your belly should move in as you breathe out.  Controlled coughing Learn  and use controlled coughing to clear mucus from your lungs. Follow these steps: 1. Lean your head a little forward. 2. Breathe in deeply. 3. Try to hold your breath for 3 seconds. 4. Keep your mouth slightly open while coughing 2 times. 5. Spit any mucus out into a tissue. 6. Rest and do the steps again 1 or 2 times as needed. General instructions  Make sure you get all the shots (vaccines) that your doctor recommends. Ask your doctor about a flu shot and a pneumonia shot.  Use oxygen therapy and pulmonary rehabilitation if told by your doctor. If you need home oxygen therapy, ask your doctor if you should buy a tool to measure your oxygen level (oximeter).  Make a COPD action plan with your doctor. This helps you to know what to do if you feel worse than usual.  Manage any other conditions you have as told by your doctor.  Avoid going outside when it is very hot, cold, or humid.  Avoid people who have a sickness you can catch (contagious).  Keep all follow-up visits as told by your doctor. This is important. Contact a doctor if:  You cough up more mucus than usual.  There is a change in the color or thickness of the mucus.  It is harder to breathe than usual.  Your breathing is faster than usual.  You have trouble sleeping.  You need to use your medicines more often than usual.  You have trouble doing your normal activities such as getting dressed   or walking around the house. Get help right away if:  You have shortness of breath while resting.  You have shortness of breath that stops you from: ? Being able to talk. ? Doing normal activities.  Your chest hurts for longer than 5 minutes.  Your skin color is more blue than usual.  Your pulse oximeter shows that you have low oxygen for longer than 5 minutes.  You have a fever.  You feel too tired to breathe normally. Summary  Chronic obstructive pulmonary disease (COPD) is a long-term lung problem.  The way your  lungs work will never return to normal. Usually the condition gets worse over time. There are things you can do to keep yourself as healthy as possible.  Take over-the-counter and prescription medicines only as told by your doctor.  If you smoke, stop. Smoking makes the problem worse. This information is not intended to replace advice given to you by your health care provider. Make sure you discuss any questions you have with your health care provider. Document Released: 09/19/2007 Document Revised: 05/07/2016 Document Reviewed: 05/07/2016 Elsevier Interactive Patient Education  2019 Elsevier Inc.  

## 2018-08-04 ENCOUNTER — Ambulatory Visit: Payer: Managed Care, Other (non HMO)

## 2018-09-15 ENCOUNTER — Ambulatory Visit: Payer: Managed Care, Other (non HMO)

## 2018-10-01 ENCOUNTER — Ambulatory Visit: Payer: Managed Care, Other (non HMO)

## 2018-10-30 ENCOUNTER — Ambulatory Visit: Payer: Self-pay | Admitting: Internal Medicine

## 2018-10-31 ENCOUNTER — Other Ambulatory Visit: Payer: Self-pay | Admitting: Adult Health

## 2018-11-25 ENCOUNTER — Encounter: Payer: Self-pay | Admitting: Internal Medicine

## 2018-11-25 ENCOUNTER — Other Ambulatory Visit: Payer: Self-pay

## 2018-11-25 ENCOUNTER — Ambulatory Visit: Payer: Managed Care, Other (non HMO) | Admitting: Internal Medicine

## 2018-11-25 VITALS — BP 120/80 | HR 83 | Resp 16

## 2018-11-25 DIAGNOSIS — J449 Chronic obstructive pulmonary disease, unspecified: Secondary | ICD-10-CM

## 2018-11-25 DIAGNOSIS — Z87891 Personal history of nicotine dependence: Secondary | ICD-10-CM | POA: Diagnosis not present

## 2018-11-25 DIAGNOSIS — K219 Gastro-esophageal reflux disease without esophagitis: Secondary | ICD-10-CM | POA: Diagnosis not present

## 2018-11-25 DIAGNOSIS — J9601 Acute respiratory failure with hypoxia: Secondary | ICD-10-CM | POA: Diagnosis not present

## 2018-11-25 NOTE — Progress Notes (Signed)
[facility] [address] [place], [place] [address]  Internal MEDICINE  Telephone Visit  Patient Name: Kristi Walter  [id number]  [id number]  Date of Service: 11/25/2018  I connected with the patient at 441 by telephone and verified the patients identity using two identifiers.   I discussed the limitations, risks, security and privacy concerns of performing an evaluation and management service by telephone and the availability of in person appointments. I also discussed with the patient that there may be a patient responsible charge related to the service.  The patient expressed understanding and agrees to proceed.    Chief Complaint  Patient presents with  . Telephone Screen  . COPD    3 month follow up  ,no hospitalizations , no prblems using oxygen at night   . Telephone Assessment    HPI  Pt seen via video.  She reports overall she has been doing well. She denies any recent hospitalizations.  She has been using her oxygen at night and taking her medications.  She denies any new or worrisome symptoms.    Current Medication: Outpatient Encounter Medications as of 11/25/2018  Medication Sig  . albuterol (PROAIR HFA) 108 (90 Base) MCG/ACT inhaler Inhale 1-2 puffs into the lungs every 4 (four) hours as needed for wheezing or shortness of breath.  . benzonatate (TESSALON) 100 MG capsule TAKE 1 CAPSULE BY MOUTH  EVERY 12 HOURS AS NEEDED  FOR COUGH  . budesonide-formoterol (SYMBICORT) 160-4.5 MCG/ACT inhaler Inhale 2 puffs into the lungs 2 (two) times daily.  Rhea Bleacher. CHANTIX CONTINUING MONTH PAK 1 MG tablet TAKE AS DIRECTED  . EPINEPHrine (EPIPEN 2-PAK) 0.3 mg/0.3 mL IJ SOAJ injection Inject 0.3 mg into the muscle once.  Marland Kitchen. ipratropium-albuterol (DUONEB) 0.5-2.5 (3) MG/3ML SOLN Take 3 mLs by nebulization every 6 (six) hours as needed (for shortness of breath or wheezing).  . montelukast (SINGULAIR) 10 MG tablet Take one tablet (10mg  total)  by mouth daily  . omeprazole (PRILOSEC)  20 MG capsule Take 1 capsule (20 mg total) by mouth daily.  . OXYGEN Inhale 5 L into the lungs. At night  . THEO-24 300 MG 24 hr capsule TAKE 1 CAPSULE BY MOUTH  DAILY  . tiotropium (SPIRIVA) 18 MCG inhalation capsule 1 capsule into inhaler and inhale once daily  . [DISCONTINUED] guaiFENesin (ROBITUSSIN) 100 MG/5ML SOLN Take 5 mLs (100 mg total) by mouth every 4 (four) hours as needed for cough or to loosen phlegm. (Patient not taking: Reported on 11/25/2018)  . [DISCONTINUED] levofloxacin (LEVAQUIN) 500 MG tablet Take 1 tablet (500 mg total) by mouth daily. (Patient not taking: Reported on 11/25/2018)  . [DISCONTINUED] predniSONE (DELTASONE) 10 MG tablet Use per dose pack (Patient not taking: Reported on 11/25/2018)   No facility-administered encounter medications on file as of 11/25/2018.     Surgical History: Past Surgical History:  Procedure Laterality Date  . CATARACT EXTRACTION W/PHACO Left 06/04/2017   Procedure: CATARACT EXTRACTION PHACO AND INTRAOCULAR LENS PLACEMENT (IOC);  Surgeon: Galen ManilaPorfilio, William, MD;  Location: [facility] ORS;  Service: Ophthalmology;  Laterality: Left;  US 00:58.5 AP% 22.3 CDE 13.03 Fluid Pack Lot # [id number]  . CESAREAN SECTION    . EYE SURGERY    . FOOT SURGERY    . pyloric     stenosis surgery  . THROAT SURGERY    . VAGINAL HYSTERECTOMY  1989    Medical History: Past Medical History:  Diagnosis Date  . Asthma   . COPD (chronic obstructive pulmonary disease) (  HCC)   . Dyspnea   . GERD (gastroesophageal reflux disease)   . Orthopnea   . Pneumonia   . Wheezing     Family History: Family History  Problem Relation Age of Onset  . Hyperlipidemia Mother   . Hypertension Mother   . Stroke Mother     Social History   Socioeconomic History  . Marital status: Married    Spouse name: Not on file  . Number of children: Not on file  . Years of education: Not on file  . Highest education level: Not on file  Occupational History  . Not on file  Social  Needs  . Financial resource strain: Not on file  . Food insecurity    Worry: Not on file    Inability: Not on file  . Transportation needs    Medical: Not on file    Non-medical: Not on file  Tobacco Use  . Smoking status: Former Smoker    Packs/day: 0.25    Years: 30.00    Pack years: 7.50    Types: Cigarettes    Quit date: 07/30/2017    Years since quitting: 1.3  . Smokeless tobacco: Never Used  Substance and Sexual Activity  . Alcohol use: No    Frequency: Never  . Drug use: No  . Sexual activity: Not on file  Lifestyle  . Physical activity    Days per week: Not on file    Minutes per session: Not on file  . Stress: Not on file  Relationships  . Social Musicianconnections    Talks on phone: Not on file    Gets together: Not on file    Attends religious service: Not on file    Active member of club or organization: Not on file    Attends meetings of clubs or organizations: Not on file    Relationship status: Not on file  . Intimate partner violence    Fear of current or ex partner: Not on file    Emotionally abused: Not on file    Physically abused: Not on file    Forced sexual activity: Not on file  Other Topics Concern  . Not on file  Social History Narrative  . Not on file      Review of Systems  Constitutional: Negative for chills, fatigue and unexpected weight change.  HENT: Negative for congestion, rhinorrhea, sneezing and sore throat.   Eyes: Negative for photophobia, pain and redness.  Respiratory: Negative for cough, chest tightness and shortness of breath.   Cardiovascular: Negative for chest pain and palpitations.  Gastrointestinal: Negative for abdominal pain, constipation, diarrhea, nausea and vomiting.  Endocrine: Negative.   Genitourinary: Negative for dysuria and frequency.  Musculoskeletal: Negative for arthralgias, back pain, joint swelling and neck pain.  Skin: Negative for rash.  Allergic/Immunologic: Negative.   Neurological: Negative for  tremors and numbness.  Hematological: Negative for adenopathy. Does not bruise/bleed easily.  Psychiatric/Behavioral: Negative for behavioral problems and sleep disturbance. The patient is not nervous/anxious.     Vital Signs: BP 120/80   Pulse 83   Resp 16    Observation/Objective: Well appearing, NAD noted.     Assessment/Plan: 1. Chronic obstructive pulmonary disease, unspecified COPD type (HCC) Stable, continue inhalers and present therapy.    2. Acute respiratory failure with hypoxemia (HCC) Continue to use nocturnal oxygen. 2 lpm  3. Gastro-esophageal reflux disease without esophagitis Stable, continue to take Prilosec as directed.   4. Former tobacco use Continues to take  Chantix, and has not smoked in awhile.    General Counseling: Kristi Walter verbalizes understanding of the findings of today's phone visit and agrees with plan of treatment. I have discussed any further diagnostic evaluation that may be needed or ordered today. We also reviewed her medications today. she has been encouraged to call the office with any questions or concerns that should arise related to todays visit.    No orders of the defined types were placed in this encounter.   No orders of the defined types were placed in this encounter.   Time spent: [facility] Internal medicine

## 2018-12-02 ENCOUNTER — Other Ambulatory Visit: Payer: Self-pay | Admitting: Internal Medicine

## 2018-12-02 ENCOUNTER — Other Ambulatory Visit: Payer: Self-pay | Admitting: Adult Health

## 2018-12-24 NOTE — Telephone Encounter (Signed)
DONE

## 2018-12-26 ENCOUNTER — Other Ambulatory Visit: Payer: Self-pay | Admitting: Internal Medicine

## 2018-12-26 NOTE — Telephone Encounter (Signed)
Is this ok?

## 2019-01-30 ENCOUNTER — Other Ambulatory Visit: Payer: Self-pay | Admitting: Adult Health

## 2019-01-30 ENCOUNTER — Other Ambulatory Visit: Payer: Self-pay | Admitting: Internal Medicine

## 2019-02-23 ENCOUNTER — Other Ambulatory Visit: Payer: Self-pay | Admitting: Otolaryngology

## 2019-02-23 DIAGNOSIS — E041 Nontoxic single thyroid nodule: Secondary | ICD-10-CM

## 2019-02-26 ENCOUNTER — Ambulatory Visit: Payer: Managed Care, Other (non HMO)

## 2019-03-10 ENCOUNTER — Ambulatory Visit
Admission: RE | Admit: 2019-03-10 | Discharge: 2019-03-10 | Disposition: A | Discharge status: Home / Self Care | Payer: Managed Care, Other (non HMO) | Source: Ambulatory Visit | Attending: Otolaryngology | Admitting: Otolaryngology

## 2019-03-10 ENCOUNTER — Other Ambulatory Visit: Payer: Self-pay | Admitting: Internal Medicine

## 2019-03-10 ENCOUNTER — Other Ambulatory Visit: Payer: Self-pay

## 2019-03-10 DIAGNOSIS — E041 Nontoxic single thyroid nodule: Secondary | ICD-10-CM | POA: Diagnosis present

## 2019-03-27 ENCOUNTER — Telehealth: Payer: Self-pay

## 2019-03-27 NOTE — Telephone Encounter (Signed)
Confirmed appointment with patient. klh °

## 2019-03-31 ENCOUNTER — Ambulatory Visit: Payer: Managed Care, Other (non HMO) | Admitting: Internal Medicine

## 2019-04-01 ENCOUNTER — Other Ambulatory Visit: Payer: Self-pay | Admitting: Adult Health

## 2019-06-16 ENCOUNTER — Other Ambulatory Visit: Payer: Self-pay | Admitting: Adult Health

## 2019-06-16 NOTE — Telephone Encounter (Signed)
Pt needs refill

## 2019-07-01 ENCOUNTER — Other Ambulatory Visit: Payer: Self-pay | Admitting: Adult Health

## 2019-07-16 ENCOUNTER — Telehealth: Payer: Self-pay

## 2019-07-16 NOTE — Telephone Encounter (Signed)
Confirmed appointment on 07/21/2019 and screened for covid. klh 

## 2019-07-21 ENCOUNTER — Other Ambulatory Visit: Payer: Self-pay

## 2019-07-21 ENCOUNTER — Ambulatory Visit: Payer: Managed Care, Other (non HMO) | Admitting: Internal Medicine

## 2019-07-21 ENCOUNTER — Encounter: Payer: Self-pay | Admitting: Internal Medicine

## 2019-07-21 VITALS — BP 105/71 | HR 93 | Resp 16 | Ht 62.5 in | Wt 179.2 lb

## 2019-07-21 DIAGNOSIS — F17219 Nicotine dependence, cigarettes, with unspecified nicotine-induced disorders: Secondary | ICD-10-CM | POA: Diagnosis not present

## 2019-07-21 DIAGNOSIS — R0602 Shortness of breath: Secondary | ICD-10-CM | POA: Diagnosis not present

## 2019-07-21 DIAGNOSIS — K219 Gastro-esophageal reflux disease without esophagitis: Secondary | ICD-10-CM

## 2019-07-21 DIAGNOSIS — J449 Chronic obstructive pulmonary disease, unspecified: Secondary | ICD-10-CM | POA: Diagnosis not present

## 2019-07-21 NOTE — Patient Instructions (Signed)
Chronic Obstructive Pulmonary Disease Chronic obstructive pulmonary disease (COPD) is a long-term (chronic) lung problem. When you have COPD, it is hard for air to get in and out of your lungs. Usually the condition gets worse over time, and your lungs will never return to normal. There are things you can do to keep yourself as healthy as possible.  Your doctor may treat your condition with: ? Medicines. ? Oxygen. ? Lung surgery.  Your doctor may also recommend: ? Rehabilitation. This includes steps to make your body work better. It may involve a team of specialists. ? Quitting smoking, if you smoke. ? Exercise and changes to your diet. ? Comfort measures (palliative care). Follow these instructions at home: Medicines  Take over-the-counter and prescription medicines only as told by your doctor.  Talk to your doctor before taking any cough or allergy medicines. You may need to avoid medicines that cause your lungs to be dry. Lifestyle  If you smoke, stop. Smoking makes the problem worse. If you need help quitting, ask your doctor.  Avoid being around things that make your breathing worse. This may include smoke, chemicals, and fumes.  Stay active, but remember to rest as well.  Learn and use tips on how to relax.  Make sure you get enough sleep. Most adults need at least 7 hours of sleep every night.  Eat healthy foods. Eat smaller meals more often. Rest before meals. Controlled breathing Learn and use tips on how to control your breathing as told by your doctor. Try:  Breathing in (inhaling) through your nose for 1 second. Then, pucker your lips and breath out (exhale) through your lips for 2 seconds.  Putting one hand on your belly (abdomen). Breathe in slowly through your nose for 1 second. Your hand on your belly should move out. Pucker your lips and breathe out slowly through your lips. Your hand on your belly should move in as you breathe out.  Controlled coughing Learn  and use controlled coughing to clear mucus from your lungs. Follow these steps: 1. Lean your head a little forward. 2. Breathe in deeply. 3. Try to hold your breath for 3 seconds. 4. Keep your mouth slightly open while coughing 2 times. 5. Spit any mucus out into a tissue. 6. Rest and do the steps again 1 or 2 times as needed. General instructions  Make sure you get all the shots (vaccines) that your doctor recommends. Ask your doctor about a flu shot and a pneumonia shot.  Use oxygen therapy and pulmonary rehabilitation if told by your doctor. If you need home oxygen therapy, ask your doctor if you should buy a tool to measure your oxygen level (oximeter).  Make a COPD action plan with your doctor. This helps you to know what to do if you feel worse than usual.  Manage any other conditions you have as told by your doctor.  Avoid going outside when it is very hot, cold, or humid.  Avoid people who have a sickness you can catch (contagious).  Keep all follow-up visits as told by your doctor. This is important. Contact a doctor if:  You cough up more mucus than usual.  There is a change in the color or thickness of the mucus.  It is harder to breathe than usual.  Your breathing is faster than usual.  You have trouble sleeping.  You need to use your medicines more often than usual.  You have trouble doing your normal activities such as getting dressed   or walking around the house. Get help right away if:  You have shortness of breath while resting.  You have shortness of breath that stops you from: ? Being able to talk. ? Doing normal activities.  Your chest hurts for longer than 5 minutes.  Your skin color is more blue than usual.  Your pulse oximeter shows that you have low oxygen for longer than 5 minutes.  You have a fever.  You feel too tired to breathe normally. Summary  Chronic obstructive pulmonary disease (COPD) is a long-term lung problem.  The way your  lungs work will never return to normal. Usually the condition gets worse over time. There are things you can do to keep yourself as healthy as possible.  Take over-the-counter and prescription medicines only as told by your doctor.  If you smoke, stop. Smoking makes the problem worse. This information is not intended to replace advice given to you by your health care provider. Make sure you discuss any questions you have with your health care provider. Document Revised: 03/15/2017 Document Reviewed: 05/07/2016 Elsevier Patient Education  2020 Elsevier Inc.  

## 2019-07-21 NOTE — Progress Notes (Signed)
[facility] [address] [place], [place] [address]  Pulmonary Sleep Medicine   Office Visit Note  Patient Name: Kristi Walter DOB: April 17, 1960 MRN [id number]  Date of Service: 07/21/2019  Complaints/HPI: COPD she states that she is doing well.  She has not had any admissions to the hospital because of COPD.  Denies having any cough and congestion.  She does have some shortness of breath on exertion.  No sputum production is noted.  He denies chest pain no palpitations.  ROS  General: (-) fever, (-) chills, (-) night sweats, (-) weakness Skin: (-) rashes, (-) itching,. Eyes: (-) visual changes, (-) redness, (-) itching. Nose and Sinuses: (-) nasal stuffiness or itchiness, (-) postnasal drip, (-) nosebleeds, (-) sinus trouble. Mouth and Throat: (-) sore throat, (-) hoarseness. Neck: (-) swollen glands, (-) enlarged thyroid, (-) neck pain. Respiratory: - cough, (-) bloody sputum, + shortness of breath, - wheezing. Cardiovascular: - ankle swelling, (-) chest pain. Lymphatic: (-) lymph node enlargement. Neurologic: (-) numbness, (-) tingling. Psychiatric: (-) anxiety, (-) depression   Current Medication: Outpatient Encounter Medications as of 07/21/2019  Medication Sig  . albuterol (VENTOLIN HFA) 108 (90 Base) MCG/ACT inhaler INHALE 1 TO 2 INHALATIONS  BY MOUTH INTO THE LUNGS  EVERY 4 HOURS AS NEEDED FOR WHEEZING OR SHORTNESS OF  BREATH  . benzonatate (TESSALON) 100 MG capsule TAKE 1 CAPSULE BY MOUTH  EVERY 12 HOURS AS NEEDED  FOR COUGH  . CHANTIX CONTINUING MONTH PAK 1 MG tablet TAKE AS DIRECTED  . EPINEPHrine (EPIPEN 2-PAK) 0.3 mg/0.3 mL IJ SOAJ injection Inject 0.3 mg into the muscle once.  Marland Kitchen ipratropium-albuterol (DUONEB) 0.5-2.5 (3) MG/3ML SOLN USE 1 VIAL VIA NEBULIZER  EVERY 6 HOURS AS NEEDED .  Marland Kitchen montelukast (SINGULAIR) 10 MG tablet TAKE 1 TABLET BY MOUTH  DAILY  . omeprazole (PRILOSEC) 20 MG capsule TAKE 1 CAPSULE BY MOUTH  DAILY  . OXYGEN Inhale 5 L into the  lungs. At night  . SYMBICORT 160-4.5 MCG/ACT inhaler USE 2 PUFFS BY MOUTH TWO  TIMES DAILY  . THEO-24 300 MG 24 hr capsule TAKE 1 CAPSULE BY MOUTH  DAILY  . tiotropium (SPIRIVA HANDIHALER) 18 MCG inhalation capsule INHALE THE CONTENTS OF 1  CAPSULE BY MOUTH VIA  HANDIHALER ONCE DAILY   No facility-administered encounter medications on file as of 07/21/2019.    Surgical History: Past Surgical History:  Procedure Laterality Date  . CATARACT EXTRACTION W/PHACO Left 06/04/2017   Procedure: CATARACT EXTRACTION PHACO AND INTRAOCULAR LENS PLACEMENT (IOC);  Surgeon: Galen Manila, MD;  Location: [facility] ORS;  Service: Ophthalmology;  Laterality: Left;  [place] 00:58.5 AP% 22.3 CDE 13.03 Fluid Pack Lot # [id number]  . CESAREAN SECTION    . EYE SURGERY    . FOOT SURGERY    . pyloric     stenosis surgery  . THROAT SURGERY    . VAGINAL HYSTERECTOMY  1989    Medical History: Past Medical History:  Diagnosis Date  . Asthma   . COPD (chronic obstructive pulmonary disease) (HCC)   . Dyspnea   . GERD (gastroesophageal reflux disease)   . Orthopnea   . Pneumonia   . Wheezing     Family History: Family History  Problem Relation Age of Onset  . Hyperlipidemia Mother   . Hypertension Mother   . Stroke Mother     Social History: Social History   Socioeconomic History  . Marital status: Married    Spouse name: Not on file  .  Number of children: Not on file  . Years of education: Not on file  . Highest education level: Not on file  Occupational History  . Not on file  Tobacco Use  . Smoking status: Former Smoker    Packs/day: 0.25    Years: 30.00    Pack years: 7.50    Types: Cigarettes    Quit date: 07/30/2017    Years since quitting: 1.9  . Smokeless tobacco: Never Used  Substance and Sexual Activity  . Alcohol use: No  . Drug use: No  . Sexual activity: Not on file  Other Topics Concern  . Not on file  Social History Narrative  . Not on file   Social Determinants of Health    Financial Resource Strain:   . Difficulty of Paying Living Expenses:   Food Insecurity:   . Worried About Charity fundraiser in the Last Year:   . Arboriculturist in the Last Year:   Transportation Needs:   . Film/video editor (Medical):   Marland Kitchen Lack of Transportation (Non-Medical):   Physical Activity:   . Days of Exercise per Week:   . Minutes of Exercise per Session:   Stress:   . Feeling of Stress :   Social Connections:   . Frequency of Communication with Friends and Family:   . Frequency of Social Gatherings with Friends and Family:   . Attends Religious Services:   . Active Member of Clubs or Organizations:   . Attends Archivist Meetings:   Marland Kitchen Marital Status:   Intimate Partner Violence:   . Fear of Current or Ex-Partner:   . Emotionally Abused:   Marland Kitchen Physically Abused:   . Sexually Abused:     Vital Signs: Blood pressure 105/71, pulse 93, resp. rate 16, height 5' 2.5" (1.588 m), weight 179 lb 3.2 oz (81.3 kg), SpO2 97 %.  Examination: General Appearance: The patient is well-developed, well-nourished, and in no distress. Skin: Gross inspection of skin unremarkable. Head: normocephalic, no gross deformities. Eyes: no gross deformities noted. ENT: ears appear grossly normal no exudates. Neck: Supple. No thyromegaly. No LAD. Respiratory: few rhonchi. Cardiovascular: Normal S1 and S2 without murmur or rub. Extremities: No cyanosis. pulses are equal. Neurologic: Alert and oriented. No involuntary movements.  LABS: No results found for this or any previous visit (from the past 2160 hour(s)).  Radiology: US THYROID  Result Date: 03/10/2019 CLINICAL DATA:  Thyroid nodule previous biopsy 07/11/2016 of the dominant left inferior 2.6 cm TR 4 nodule. EXAM: THYROID ULTRASOUND TECHNIQUE: Ultrasound examination of the thyroid gland and adjacent soft tissues was performed. COMPARISON:  07/30/2017 FINDINGS: Parenchymal Echotexture: Markedly heterogenous Isthmus: 6  mm Right lobe: 5.0 x 2.1 x 2.4 cm Left lobe: 5.6 x 2.6 x 2.4 cm _________________________________________________________ Estimated total number of nodules >/= 1 cm: 4 Number of spongiform nodules >/=  2 cm not described below (TR1): 0 Number of mixed cystic and solid nodules >/= 1.5 cm not described below (TR2): 0 _________________________________________________________ Nodule 1: 1 cm mid isthmus TR 4 nodule is stable and warrants continued follow-up at 1 year. Nodule 2: 1.1 cm mid isthmus TR 4 nodule is stable and meets criteria for continued follow-up 1 year. Nodule 3 (previously 7): 1.2 cm left superior TR 4 nodule meets criteria for continued follow-up 1 year. Nodule 4 previously 8: 2.3 cm left inferior TR 4 nodule has been previously biopsied. Correlate with prior pathology. Stable gland heterogeneity and additional subcentimeter benign-appearing cystic nodules  noted. No new or enlarging nodule. IMPRESSION: Stable heterogeneous thyroid with isthmus and left thyroid TR 4 nodules meeting criteria for continued follow-up at 1 year for a total of 5 years. 2.3 cm left inferior TR 4 nodule previously biopsied correlate with prior pathology Stable gland heterogeneity and subcentimeter benign nodules. The above is in keeping with the ACR TI-RADS recommendations - J Am Coll Radiol 2017;14:587-595. Electronically Signed   By: Judie Petit.  Shick M.D.   On: 03/10/2019 16:27    No results found.  No results found.    Assessment and Plan: Patient Active Problem List   Diagnosis Date Noted  . Acute respiratory failure with hypoxemia (HCC) 05/24/2016  . SVT (supraventricular tachycardia) (HCC) 04/27/2014  . Gastro-esophageal reflux disease without esophagitis 04/26/2014  . COPD with asthma (HCC) 04/26/2014  . Low back sprain 09/15/2013  . Current tobacco use 03/06/2013  . Allergic rhinitis 07/13/2010  . Allergic to food 07/13/2010  . Chronic obstructive pulmonary disease (HCC) 08/14/2000    1. COPD continue  with present management at this time she is doing fairly well.  She has not had any exacerbations and no admissions to the hospital because of the COPDdoes not smoke 2. Chronic respiratory Failure at baseline right now oxygen therapy as warranted continue to follow along 3. GERD no active reflux is noted 4. Smoker currently not smoking.  She has done fairly well off of cigarettes encouraged to stay away from smoking as well as smoking.  General Counseling: I have discussed the findings of the evaluation and examination with Zakhia.  I have also discussed any further diagnostic evaluation thatmay be needed or ordered today. Varetta verbalizes understanding of the findings of todays visit. We also reviewed her medications today and discussed drug interactions and side effects including but not limited excessive drowsiness and altered mental states. We also discussed that there is always a risk not just to her but also people around her. she has been encouraged to call the office with any questions or concerns that should arise related to todays visit.  Orders Placed This Encounter  Procedures  . Spirometry with graph    Order Specific Question:   Where should this test be performed?    Answer:   [facility]     Time spent: 20  I have personally obtained a history, examined the patient, evaluated laboratory and imaging results, formulated the assessment and plan and placed orders.    Yevonne Pax, MD [facility] Pulmonary and Critical Care Sleep medicine

## 2019-09-07 ENCOUNTER — Other Ambulatory Visit: Payer: Self-pay | Admitting: Internal Medicine

## 2019-10-07 ENCOUNTER — Other Ambulatory Visit: Payer: Self-pay | Admitting: Internal Medicine

## 2019-10-07 ENCOUNTER — Other Ambulatory Visit: Payer: Self-pay | Admitting: Adult Health

## 2019-10-19 ENCOUNTER — Other Ambulatory Visit: Payer: Self-pay | Admitting: Internal Medicine

## 2019-11-24 ENCOUNTER — Other Ambulatory Visit: Payer: Self-pay | Admitting: Adult Health

## 2019-11-24 ENCOUNTER — Encounter: Payer: Self-pay | Admitting: Internal Medicine

## 2019-11-24 ENCOUNTER — Ambulatory Visit: Payer: Managed Care, Other (non HMO) | Admitting: Internal Medicine

## 2019-11-24 ENCOUNTER — Other Ambulatory Visit: Payer: Self-pay

## 2019-11-24 VITALS — BP 94/72 | HR 105 | Temp 97.8°F | Resp 16 | Ht 62.5 in | Wt 178.2 lb

## 2019-11-24 DIAGNOSIS — Z87891 Personal history of nicotine dependence: Secondary | ICD-10-CM

## 2019-11-24 DIAGNOSIS — J988 Other specified respiratory disorders: Secondary | ICD-10-CM

## 2019-11-24 DIAGNOSIS — K219 Gastro-esophageal reflux disease without esophagitis: Secondary | ICD-10-CM

## 2019-11-24 DIAGNOSIS — R0602 Shortness of breath: Secondary | ICD-10-CM | POA: Diagnosis not present

## 2019-11-24 DIAGNOSIS — J9601 Acute respiratory failure with hypoxia: Secondary | ICD-10-CM | POA: Diagnosis not present

## 2019-11-24 DIAGNOSIS — G4734 Idiopathic sleep related nonobstructive alveolar hypoventilation: Secondary | ICD-10-CM

## 2019-11-24 DIAGNOSIS — J449 Chronic obstructive pulmonary disease, unspecified: Secondary | ICD-10-CM

## 2019-11-24 MED ORDER — VARENICLINE TARTRATE 1 MG PO TABS: ORAL_TABLET | ORAL | 0 refills | Status: DC

## 2019-11-24 MED ORDER — PREDNISONE 10 MG PO TABS: ORAL_TABLET | ORAL | 0 refills | Status: DC

## 2019-11-24 MED ORDER — LEVOFLOXACIN 500 MG PO TABS: 500.0000 mg | ORAL_TABLET | Freq: Every day | ORAL | 0 refills | Status: DC

## 2019-11-24 NOTE — Progress Notes (Signed)
[facility] [address] [place], [place] [address]  Pulmonary Sleep Medicine   Office Visit Note  Patient Name: Kristi Walter DOB: July 17, 1960 MRN [id number]  Date of Service: 11/24/2019  Complaints/HPI: Pt is here for pulmonary visit.  She has history of  Copd, GERD,  And respiratory failure.  She reports over the last few days she has been have some increased sob. She quit smoking about 2 years ago.  She continues to use oxygen 2 lpm at night.     ROS  General: (-) fever, (-) chills, (-) night sweats, (-) weakness Skin: (-) rashes, (-) itching,. Eyes: (-) visual changes, (-) redness, (-) itching. Nose and Sinuses: (-) nasal stuffiness or itchiness, (-) postnasal drip, (-) nosebleeds, (-) sinus trouble. Mouth and Throat: (-) sore throat, (-) hoarseness. Neck: (-) swollen glands, (-) enlarged thyroid, (-) neck pain. Respiratory: + cough, (-) bloody sputum, + shortness of breath, + wheezing. Cardiovascular: - ankle swelling, (-) chest pain. Lymphatic: (-) lymph node enlargement. Neurologic: (-) numbness, (-) tingling. Psychiatric: (-) anxiety, (-) depression   Current Medication: Outpatient Encounter Medications as of 11/24/2019  Medication Sig  . benzonatate (TESSALON) 100 MG capsule TAKE 1 CAPSULE BY MOUTH  EVERY 12 HOURS AS NEEDED  FOR COUGH  . CHANTIX CONTINUING MONTH PAK 1 MG tablet TAKE AS DIRECTED  . EPINEPHrine (EPIPEN 2-PAK) 0.3 mg/0.3 mL IJ SOAJ injection Inject 0.3 mg into the muscle once.  Marland Kitchen ipratropium-albuterol (DUONEB) 0.5-2.5 (3) MG/3ML SOLN USE 1 VIAL VIA NEBULIZER  EVERY 6 HOURS AS NEEDED .  Marland Kitchen montelukast (SINGULAIR) 10 MG tablet TAKE 1 TABLET BY MOUTH  DAILY  . omeprazole (PRILOSEC) 20 MG capsule TAKE 1 CAPSULE BY MOUTH  DAILY  . OXYGEN Inhale 5 L into the lungs. At night  . SPIRIVA HANDIHALER 18 MCG inhalation capsule INHALE THE CONTENTS OF 1  CAPSULE BY MOUTH VIA  HANDIHALER ONCE DAILY  . SYMBICORT 160-4.5 MCG/ACT inhaler USE 2 PUFFS BY MOUTH  TWO  TIMES DAILY  . THEO-24 300 MG 24 hr capsule TAKE 1 CAPSULE BY MOUTH  DAILY  . [DISCONTINUED] albuterol (VENTOLIN HFA) 108 (90 Base) MCG/ACT inhaler INHALE 1 TO 2 INHALATIONS  BY MOUTH INTO THE LUNGS  EVERY 4 HOURS AS NEEDED FOR WHEEZING OR SHORTNESS OF  BREATH   No facility-administered encounter medications on file as of 11/24/2019.    Surgical History: Past Surgical History:  Procedure Laterality Date  . CATARACT EXTRACTION W/PHACO Left 06/04/2017   Procedure: CATARACT EXTRACTION PHACO AND INTRAOCULAR LENS PLACEMENT (IOC);  Surgeon: Galen Manila, MD;  Location: [facility] ORS;  Service: Ophthalmology;  Laterality: Left;  [place] 00:58.5 AP% 22.3 CDE 13.03 Fluid Pack Lot # [id number]  . CESAREAN SECTION    . EYE SURGERY    . FOOT SURGERY    . pyloric     stenosis surgery  . THROAT SURGERY    . VAGINAL HYSTERECTOMY  1989    Medical History: Past Medical History:  Diagnosis Date  . Asthma   . COPD (chronic obstructive pulmonary disease) (HCC)   . Dyspnea   . GERD (gastroesophageal reflux disease)   . Orthopnea   . Pneumonia   . Wheezing     Family History: Family History  Problem Relation Age of Onset  . Hyperlipidemia Mother   . Hypertension Mother   . Stroke Mother     Social History: Social History   Socioeconomic History  . Marital status: Widowed    Spouse name: Not on file  .  Number of children: Not on file  . Years of education: Not on file  . Highest education level: Not on file  Occupational History  . Not on file  Tobacco Use  . Smoking status: Former Smoker    Packs/day: 0.25    Years: 30.00    Pack years: 7.50    Types: Cigarettes    Quit date: 07/30/2017    Years since quitting: 2.3  . Smokeless tobacco: Never Used  Vaping Use  . Vaping Use: Never used  Substance and Sexual Activity  . Alcohol use: No  . Drug use: No  . Sexual activity: Not on file  Other Topics Concern  . Not on file  Social History Narrative  . Not on file   Social  Determinants of Health   Financial Resource Strain:   . Difficulty of Paying Living Expenses:   Food Insecurity:   . Worried About Programme researcher, broadcasting/film/video in the Last Year:   . Barista in the Last Year:   Transportation Needs:   . Freight forwarder (Medical):   Marland Kitchen Lack of Transportation (Non-Medical):   Physical Activity:   . Days of Exercise per Week:   . Minutes of Exercise per Session:   Stress:   . Feeling of Stress :   Social Connections:   . Frequency of Communication with Friends and Family:   . Frequency of Social Gatherings with Friends and Family:   . Attends Religious Services:   . Active Member of Clubs or Organizations:   . Attends Banker Meetings:   Marland Kitchen Marital Status:   Intimate Partner Violence:   . Fear of Current or Ex-Partner:   . Emotionally Abused:   Marland Kitchen Physically Abused:   . Sexually Abused:     Vital Signs: Blood pressure 94/72, pulse (!) 105, temperature 97.8 F (36.6 C), resp. rate 16, height 5' 2.5" (1.588 m), weight 178 lb 3.2 oz (80.8 kg), SpO2 95 %.  Examination: General Appearance: The patient is well-developed, well-nourished, and in no distress. Skin: Gross inspection of skin unremarkable. Head: normocephalic, no gross deformities. Eyes: no gross deformities noted. ENT: ears appear grossly normal no exudates. Neck: Supple. No thyromegaly. No LAD. Respiratory: course breath sounds. Cardiovascular: Normal S1 and S2 without murmur or rub. Extremities: No cyanosis. pulses are equal. Neurologic: Alert and oriented. No involuntary movements.  LABS: No results found for this or any previous visit (from the past 2160 hour(s)).  Radiology: US THYROID  Result Date: 03/10/2019 CLINICAL DATA:  Thyroid nodule previous biopsy 07/11/2016 of the dominant left inferior 2.6 cm TR 4 nodule. EXAM: THYROID ULTRASOUND TECHNIQUE: Ultrasound examination of the thyroid gland and adjacent soft tissues was performed. COMPARISON:  07/30/2017  FINDINGS: Parenchymal Echotexture: Markedly heterogenous Isthmus: 6 mm Right lobe: 5.0 x 2.1 x 2.4 cm Left lobe: 5.6 x 2.6 x 2.4 cm _________________________________________________________ Estimated total number of nodules >/= 1 cm: 4 Number of spongiform nodules >/=  2 cm not described below (TR1): 0 Number of mixed cystic and solid nodules >/= 1.5 cm not described below (TR2): 0 _________________________________________________________ Nodule 1: 1 cm mid isthmus TR 4 nodule is stable and warrants continued follow-up at 1 year. Nodule 2: 1.1 cm mid isthmus TR 4 nodule is stable and meets criteria for continued follow-up 1 year. Nodule 3 (previously 7): 1.2 cm left superior TR 4 nodule meets criteria for continued follow-up 1 year. Nodule 4 previously 8: 2.3 cm left inferior TR 4 nodule has  been previously biopsied. Correlate with prior pathology. Stable gland heterogeneity and additional subcentimeter benign-appearing cystic nodules noted. No new or enlarging nodule. IMPRESSION: Stable heterogeneous thyroid with isthmus and left thyroid TR 4 nodules meeting criteria for continued follow-up at 1 year for a total of 5 years. 2.3 cm left inferior TR 4 nodule previously biopsied correlate with prior pathology Stable gland heterogeneity and subcentimeter benign nodules. The above is in keeping with the ACR TI-RADS recommendations - J Am Coll Radiol 2017;14:587-595. Electronically Signed   By: Judie Petit.  Shick M.D.   On: 03/10/2019 16:27    No results found.  No results found.    Assessment and Plan: Patient Active Problem List   Diagnosis Date Noted  . Acute respiratory failure with hypoxemia (HCC) 05/24/2016  . SVT (supraventricular tachycardia) (HCC) 04/27/2014  . Gastro-esophageal reflux disease without esophagitis 04/26/2014  . COPD with asthma (HCC) 04/26/2014  . Low back sprain 09/15/2013  . Current tobacco use 03/06/2013  . Allergic rhinitis 07/13/2010  . Allergic to food 07/13/2010  . Chronic  obstructive pulmonary disease (HCC) 08/14/2000    1. Chronic obstructive pulmonary disease, unspecified COPD type (HCC) Continue with inhalers and nebulizers as directed. Severe disease. continue to monitor.   2. Acute respiratory failure with hypoxemia (HCC) Continue with oxygen and medication therapy as before.   3. Gastro-esophageal reflux disease without esophagitis No recent issues, continue to monitor.   4. Respiratory infection Advised patient to take entire course of antibiotics as prescribed with food. Pt should return to clinic in 7-10 days if symptoms fail to improve or new symptoms develop.  - predniSONE (DELTASONE) 10 MG tablet; Use per dose pack  Dispense: 21 tablet; Refill: 0 - levofloxacin (LEVAQUIN) 500 MG tablet; Take 1 tablet (500 mg total) by mouth daily.  Dispense: 7 tablet; Refill: 0  5. Former tobacco use - varenicline (CHANTIX CONTINUING MONTH PAK) 1 MG tablet; TAKE AS DIRECTED  Dispense: 56 tablet; Refill: 0  6. Nocturnal hypoxia Continue to use oxygen  7. SOB (shortness of breath) - Spirometry with Graph  General Counseling: I have discussed the findings of the evaluation and examination with Tema.  I have also discussed any further diagnostic evaluation thatmay be needed or ordered today. Graycee verbalizes understanding of the findings of todays visit. We also reviewed her medications today and discussed drug interactions and side effects including but not limited excessive drowsiness and altered mental states. We also discussed that there is always a risk not just to her but also people around her. she has been encouraged to call the office with any questions or concerns that should arise related to todays visit.  Orders Placed This Encounter  Procedures  . Spirometry with Graph    Order Specific Question:   Where should this test be performed?    Answer:   Other     Time spent: 25 This patient was seen by Blima Ledger AGNP-C in Collaboration with Dr.  Freda Munro as a part of collaborative care agreement.   I have personally obtained a history, examined the patient, evaluated laboratory and imaging results, formulated the assessment and plan and placed orders.    Yevonne Pax, MD [facility] Pulmonary and Critical Care Sleep medicine

## 2020-02-04 ENCOUNTER — Other Ambulatory Visit: Payer: Self-pay

## 2020-02-04 DIAGNOSIS — J449 Chronic obstructive pulmonary disease, unspecified: Secondary | ICD-10-CM

## 2020-02-08 NOTE — Progress Notes (Signed)
Scanned in [facility] wellness form/ Kristi Walter

## 2020-02-10 ENCOUNTER — Ambulatory Visit: Payer: Managed Care, Other (non HMO) | Admitting: Internal Medicine

## 2020-02-15 ENCOUNTER — Other Ambulatory Visit: Payer: Self-pay | Admitting: Internal Medicine

## 2020-02-18 ENCOUNTER — Other Ambulatory Visit: Payer: Self-pay | Admitting: Internal Medicine

## 2020-02-19 ENCOUNTER — Telehealth: Payer: Self-pay

## 2020-03-01 ENCOUNTER — Ambulatory Visit: Payer: Managed Care, Other (non HMO) | Admitting: Internal Medicine

## 2020-03-02 ENCOUNTER — Other Ambulatory Visit: Payer: Self-pay

## 2020-03-02 DIAGNOSIS — Z5181 Encounter for therapeutic drug level monitoring: Secondary | ICD-10-CM

## 2020-03-09 ENCOUNTER — Ambulatory Visit: Payer: Managed Care, Other (non HMO) | Admitting: Internal Medicine

## 2020-03-22 ENCOUNTER — Ambulatory Visit: Payer: Managed Care, Other (non HMO) | Admitting: Internal Medicine

## 2020-03-23 ENCOUNTER — Ambulatory Visit: Payer: Managed Care, Other (non HMO) | Admitting: Internal Medicine

## 2020-03-23 ENCOUNTER — Other Ambulatory Visit: Payer: Self-pay

## 2020-03-23 DIAGNOSIS — R0602 Shortness of breath: Secondary | ICD-10-CM | POA: Diagnosis not present

## 2020-03-23 DIAGNOSIS — J449 Chronic obstructive pulmonary disease, unspecified: Secondary | ICD-10-CM

## 2020-03-23 LAB — PULMONARY FUNCTION TEST

## 2020-03-24 LAB — THEOPHYLLINE LEVEL: Theophylline, Serum: 5.9 ug/mL — ABNORMAL LOW (ref 10.0–20.0)

## 2020-03-28 ENCOUNTER — Ambulatory Visit: Payer: Managed Care, Other (non HMO) | Admitting: Internal Medicine

## 2020-03-28 ENCOUNTER — Encounter: Payer: Self-pay | Admitting: Internal Medicine

## 2020-03-28 ENCOUNTER — Other Ambulatory Visit: Payer: Self-pay

## 2020-03-28 VITALS — BP 108/80 | HR 85 | Temp 97.9°F | Resp 16 | Ht 62.5 in | Wt 173.2 lb

## 2020-03-28 DIAGNOSIS — G4734 Idiopathic sleep related nonobstructive alveolar hypoventilation: Secondary | ICD-10-CM | POA: Diagnosis not present

## 2020-03-28 DIAGNOSIS — J449 Chronic obstructive pulmonary disease, unspecified: Secondary | ICD-10-CM

## 2020-03-28 DIAGNOSIS — F17219 Nicotine dependence, cigarettes, with unspecified nicotine-induced disorders: Secondary | ICD-10-CM | POA: Diagnosis not present

## 2020-03-28 DIAGNOSIS — K219 Gastro-esophageal reflux disease without esophagitis: Secondary | ICD-10-CM | POA: Diagnosis not present

## 2020-03-28 MED ORDER — THEOPHYLLINE ER 400 MG PO TB24: 400.0000 mg | ORAL_TABLET | Freq: Every day | ORAL | 4 refills | Status: DC

## 2020-03-28 NOTE — Progress Notes (Signed)
[facility] [address] [place], [place] [address]  Pulmonary Sleep Medicine   Office Visit Note  Patient Name: Kristi Walter DOB: [birth year] MRN [id number]  Date of Service: 03/28/2020  Complaints/HPI: COPD she has been on theophylline her level is 5.9 which is low. She states that she is taking the medication every day. She states that she is not smoking. She states her son smokes. SHe states that he smokes outdoors. She however had a very strong odor of smokes. States that her son was smoking in the car. Her FEV1 is at 0.5L which is 21% of predicted indicating very severe COPD.  ROS  General: (-) fever, (-) chills, (-) night sweats, (-) weakness Skin: (-) rashes, (-) itching,. Eyes: (-) visual changes, (-) redness, (-) itching. Nose and Sinuses: (-) nasal stuffiness or itchiness, (-) postnasal drip, (-) nosebleeds, (-) sinus trouble. Mouth and Throat: (-) sore throat, (-) hoarseness. Neck: (-) swollen glands, (-) enlarged thyroid, (-) neck pain. Respiratory: - cough, (-) bloody sputum, + shortness of breath, - wheezing. Cardiovascular: - ankle swelling, (-) chest pain. Lymphatic: (-) lymph node enlargement. Neurologic: (-) numbness, (-) tingling. Psychiatric: (-) anxiety, (-) depression   Current Medication: Outpatient Encounter Medications as of 03/28/2020  Medication Sig   albuterol (VENTOLIN HFA) 108 (90 Base) MCG/ACT inhaler INHALE 1 TO 2 INHALATIONS  BY MOUTH INTO THE LUNGS  EVERY 4 HOURS AS NEEDED FOR WHEEZING OR SHORTNESS OF  BREATH   benzonatate (TESSALON) 100 MG capsule TAKE 1 CAPSULE BY MOUTH  EVERY 12 HOURS AS NEEDED  FOR COUGH   EPINEPHrine 0.3 mg/0.3 mL IJ SOAJ injection Inject 0.3 mg into the muscle once.   ipratropium-albuterol (DUONEB) 0.5-2.5 (3) MG/3ML SOLN USE 1 VIAL VIA NEBULIZER  EVERY 6 HOURS AS NEEDED .   levofloxacin (LEVAQUIN) 500 MG tablet Take 1 tablet (500 mg total) by mouth daily.   montelukast (SINGULAIR) 10 MG tablet TAKE 1  TABLET BY MOUTH  DAILY   omeprazole (PRILOSEC) 20 MG capsule TAKE 1 CAPSULE BY MOUTH  DAILY   OXYGEN Inhale 5 L into the lungs. At night   predniSONE (DELTASONE) 10 MG tablet Use per dose pack   SPIRIVA HANDIHALER 18 MCG inhalation capsule INHALE THE CONTENTS OF 1  CAPSULE BY MOUTH VIA  HANDIHALER ONCE DAILY   SYMBICORT 160-4.5 MCG/ACT inhaler USE 2 INHALATIONS BY MOUTH  TWICE DAILY   varenicline (CHANTIX CONTINUING MONTH PAK) 1 MG tablet TAKE AS DIRECTED   [DISCONTINUED] THEO-24 300 MG 24 hr capsule TAKE 1 CAPSULE BY MOUTH  DAILY   theophylline (UNIPHYL) 400 MG 24 hr tablet Take 1 tablet (400 mg total) by mouth daily.   No facility-administered encounter medications on file as of 03/28/2020.    Surgical History: Past Surgical History:  Procedure Laterality Date   CATARACT EXTRACTION W/PHACO Left 06/04/2017   Procedure: CATARACT EXTRACTION PHACO AND INTRAOCULAR LENS PLACEMENT (IOC);  Surgeon: Galen Manila, MD;  Location: [facility] ORS;  Service: Ophthalmology;  Laterality: Left;  [place] 00:58.5 AP% 22.3 CDE 13.03 Fluid Pack Lot # [id number]   CESAREAN SECTION     EYE SURGERY     FOOT SURGERY     pyloric     stenosis surgery   THROAT SURGERY     VAGINAL HYSTERECTOMY  1989    Medical History: Past Medical History:  Diagnosis Date   Asthma    COPD (chronic obstructive pulmonary disease) (HCC)    Dyspnea    GERD (gastroesophageal reflux disease)  Orthopnea    Pneumonia    Wheezing     Family History: Family History  Problem Relation Age of Onset   Hyperlipidemia Mother    Hypertension Mother    Stroke Mother     Social History: Social History   Socioeconomic History   Marital status: Widowed    Spouse name: Not on file   Number of children: Not on file   Years of education: Not on file   Highest education level: Not on file  Occupational History   Not on file  Tobacco Use   Smoking status: Former Smoker    Packs/day: 0.25    Years:  30.00    Pack years: 7.50    Types: Cigarettes    Quit date: 07/30/2017    Years since quitting: 2.6   Smokeless tobacco: Never Used  Vaping Use   Vaping Use: Never used  Substance and Sexual Activity   Alcohol use: No   Drug use: No   Sexual activity: Not on file  Other Topics Concern   Not on file  Social History Narrative   Not on file   Social Determinants of Health   Financial Resource Strain: Not on file  Food Insecurity: Not on file  Transportation Needs: Not on file  Physical Activity: Not on file  Stress: Not on file  Social Connections: Not on file  Intimate Partner Violence: Not on file    Vital Signs: Blood pressure 108/80, pulse 85, temperature 97.9 F (36.6 C), resp. rate 16, height 5' 2.5" (1.588 m), weight 173 lb 3.2 oz (78.6 kg), SpO2 95 %.  Examination: General Appearance: The patient is well-developed, well-nourished, and in no distress. Skin: Gross inspection of skin unremarkable. Head: normocephalic, no gross deformities. Eyes: no gross deformities noted. ENT: ears appear grossly normal no exudates. Neck: Supple. No thyromegaly. No LAD. Respiratory: no rhonchi noted. Cardiovascular: Normal S1 and S2 without murmur or rub. Extremities: No cyanosis. pulses are equal. Neurologic: Alert and oriented. No involuntary movements.  LABS: Recent Results (from the past 2160 hour(s))  Theophylline level     Status: Abnormal   Collection Time: 03/23/20 11:24 AM  Result Value Ref Range   Theophylline, Serum 5.9 (L) 10.0 - 20.0 ug/mL    Comment:                                 Detection Limit =  0.8                           <0.8 indicates None Detected     Radiology: US THYROID  Result Date: 03/10/2019 CLINICAL DATA:  Thyroid nodule previous biopsy 07/11/2016 of the dominant left inferior 2.6 cm TR 4 nodule. EXAM: THYROID ULTRASOUND TECHNIQUE: Ultrasound examination of the thyroid gland and adjacent soft tissues was performed. COMPARISON:   07/30/2017 FINDINGS: Parenchymal Echotexture: Markedly heterogenous Isthmus: 6 mm Right lobe: 5.0 x 2.1 x 2.4 cm Left lobe: 5.6 x 2.6 x 2.4 cm _________________________________________________________ Estimated total number of nodules >/= 1 cm: 4 Number of spongiform nodules >/=  2 cm not described below (TR1): 0 Number of mixed cystic and solid nodules >/= 1.5 cm not described below (TR2): 0 _________________________________________________________ Nodule 1: 1 cm mid isthmus TR 4 nodule is stable and warrants continued follow-up at 1 year. Nodule 2: 1.1 cm mid isthmus TR 4 nodule is stable and meets criteria for  continued follow-up 1 year. Nodule 3 (previously 7): 1.2 cm left superior TR 4 nodule meets criteria for continued follow-up 1 year. Nodule 4 previously 8: 2.3 cm left inferior TR 4 nodule has been previously biopsied. Correlate with prior pathology. Stable gland heterogeneity and additional subcentimeter benign-appearing cystic nodules noted. No new or enlarging nodule. IMPRESSION: Stable heterogeneous thyroid with isthmus and left thyroid TR 4 nodules meeting criteria for continued follow-up at 1 year for a total of 5 years. 2.3 cm left inferior TR 4 nodule previously biopsied correlate with prior pathology Stable gland heterogeneity and subcentimeter benign nodules. The above is in keeping with the ACR TI-RADS recommendations - J Am Coll Radiol 2017;14:587-595. Electronically Signed   By: Judie Petit.  Shick M.D.   On: 03/10/2019 16:27    No results found.  No results found.    Assessment and Plan: Patient Active Problem List   Diagnosis Date Noted   Acute respiratory failure with hypoxemia (HCC) 05/24/2016   SVT (supraventricular tachycardia) (HCC) 04/27/2014   Gastro-esophageal reflux disease without esophagitis 04/26/2014   COPD with asthma (HCC) 04/26/2014   Low back sprain 09/15/2013   Current tobacco use 03/06/2013   Allergic rhinitis 07/13/2010   Allergic to food 07/13/2010    Chronic obstructive pulmonary disease (HCC) 08/14/2000    1. COPD very severe. She is taking theophylline and is subtherapeutic. She may benefit from a bump in the dose to 400mg  from 300mg  we will need to monitor the patient's the office levels closely 2. GERD appears to be under control.  PPI as necessary 3. Nicotine exposure states that she is not smoking she and I had a discussion regarding her being exposed to secondary smoke she smelled very strongly of cigarette smoke.  Apparently her son smokes have explained to her that she needs to tell him to not smoke at all inside the house. 4. Nocturnal hypoxia on oxygen therapy at night  General Counseling: I have discussed the findings of the evaluation and examination with Anjeanette.  I have also discussed any further diagnostic evaluation thatmay be needed or ordered today. Ohana verbalizes understanding of the findings of todays visit. We also reviewed her medications today and discussed drug interactions and side effects including but not limited excessive drowsiness and altered mental states. We also discussed that there is always a risk not just to her but also people around her. she has been encouraged to call the office with any questions or concerns that should arise related to todays visit.  No orders of the defined types were placed in this encounter.    Time spent: 29  I have personally obtained a history, examined the patient, evaluated laboratory and imaging results, formulated the assessment and plan and placed orders.    , MD [facility] Pulmonary and Critical Care Sleep medicine

## 2020-03-28 NOTE — Patient Instructions (Signed)
Chronic Obstructive Pulmonary Disease Chronic obstructive pulmonary disease (COPD) is a long-term (chronic) lung problem. When you have COPD, it is hard for air to get in and out of your lungs. Usually the condition gets worse over time, and your lungs will never return to normal. There are things you can do to keep yourself as healthy as possible.  Your doctor may treat your condition with: ? Medicines. ? Oxygen. ? Lung surgery.  Your doctor may also recommend: ? Rehabilitation. This includes steps to make your body work better. It may involve a team of specialists. ? Quitting smoking, if you smoke. ? Exercise and changes to your diet. ? Comfort measures (palliative care). Follow these instructions at home: Medicines  Take over-the-counter and prescription medicines only as told by your doctor.  Talk to your doctor before taking any cough or allergy medicines. You may need to avoid medicines that cause your lungs to be dry. Lifestyle  If you smoke, stop. Smoking makes the problem worse. If you need help quitting, ask your doctor.  Avoid being around things that make your breathing worse. This may include smoke, chemicals, and fumes.  Stay active, but remember to rest as well.  Learn and use tips on how to relax.  Make sure you get enough sleep. Most adults need at least 7 hours of sleep every night.  Eat healthy foods. Eat smaller meals more often. Rest before meals. Controlled breathing Learn and use tips on how to control your breathing as told by your doctor. Try:  Breathing in (inhaling) through your nose for 1 second. Then, pucker your lips and breath out (exhale) through your lips for 2 seconds.  Putting one hand on your belly (abdomen). Breathe in slowly through your nose for 1 second. Your hand on your belly should move out. Pucker your lips and breathe out slowly through your lips. Your hand on your belly should move in as you breathe out.  Controlled coughing Learn  and use controlled coughing to clear mucus from your lungs. Follow these steps: 1. Lean your head a little forward. 2. Breathe in deeply. 3. Try to hold your breath for 3 seconds. 4. Keep your mouth slightly open while coughing 2 times. 5. Spit any mucus out into a tissue. 6. Rest and do the steps again 1 or 2 times as needed. General instructions  Make sure you get all the shots (vaccines) that your doctor recommends. Ask your doctor about a flu shot and a pneumonia shot.  Use oxygen therapy and pulmonary rehabilitation if told by your doctor. If you need home oxygen therapy, ask your doctor if you should buy a tool to measure your oxygen level (oximeter).  Make a COPD action plan with your doctor. This helps you to know what to do if you feel worse than usual.  Manage any other conditions you have as told by your doctor.  Avoid going outside when it is very hot, cold, or humid.  Avoid people who have a sickness you can catch (contagious).  Keep all follow-up visits as told by your doctor. This is important. Contact a doctor if:  You cough up more mucus than usual.  There is a change in the color or thickness of the mucus.  It is harder to breathe than usual.  Your breathing is faster than usual.  You have trouble sleeping.  You need to use your medicines more often than usual.  You have trouble doing your normal activities such as getting dressed   or walking around the house. Get help right away if:  You have shortness of breath while resting.  You have shortness of breath that stops you from: ? Being able to talk. ? Doing normal activities.  Your chest hurts for longer than 5 minutes.  Your skin color is more blue than usual.  Your pulse oximeter shows that you have low oxygen for longer than 5 minutes.  You have a fever.  You feel too tired to breathe normally. Summary  Chronic obstructive pulmonary disease (COPD) is a long-term lung problem.  The way your  lungs work will never return to normal. Usually the condition gets worse over time. There are things you can do to keep yourself as healthy as possible.  Take over-the-counter and prescription medicines only as told by your doctor.  If you smoke, stop. Smoking makes the problem worse. This information is not intended to replace advice given to you by your health care provider. Make sure you discuss any questions you have with your health care provider. Document Revised: 03/15/2017 Document Reviewed: 05/07/2016 Elsevier Patient Education  2020 Elsevier Inc.  

## 2020-03-31 NOTE — Procedures (Signed)
[facility] [address] [place], [address]  DATE OF SERVICE: March 23, 2020  Complete Pulmonary Function Testing Interpretation:  FINDINGS:  Forced vital capacity is severely decreased.  FEV1 is 0.58 L which is 24% of predicted and is severely decreased.  FEV1 FVC ratio is moderately decreased.  Postbronchodilator no significant improvement in FEV1.  Total lung capacity is normal.  Residual volume is increased residual volume total capacity ratio is increased FRC is increased.  Patient not able to perform DLCO  IMPRESSION:  This pulmonary function study is consistent with severe obstructive lung disease.  Patient was not able to perform DLCO  Yevonne Pax, MD [facility] Pulmonary Critical Care Medicine Sleep Medicine

## 2020-04-17 ENCOUNTER — Other Ambulatory Visit: Payer: Self-pay

## 2020-04-17 DIAGNOSIS — J449 Chronic obstructive pulmonary disease, unspecified: Secondary | ICD-10-CM

## 2020-04-17 MED ORDER — THEOPHYLLINE ER 400 MG PO TB24: 400.0000 mg | ORAL_TABLET | Freq: Every day | ORAL | 4 refills | Status: DC

## 2020-04-20 IMAGING — US US THYROID
1 series · 13 of 25 positions shown · non-contrast
Comparison: 07/30/2017

CLINICAL DATA: Thyroid nodule previous biopsy 07/11/2016 of the
dominant left inferior 2.6 cm TR 4 nodule.

EXAM:
THYROID ULTRASOUND
TECHNIQUE: Ultrasound examination of the thyroid gland and adjacent soft
tissues was performed.

[Series 1: us thyroid · 0.09mm/px · 13 of 51 slices shown]
[im 1/51]
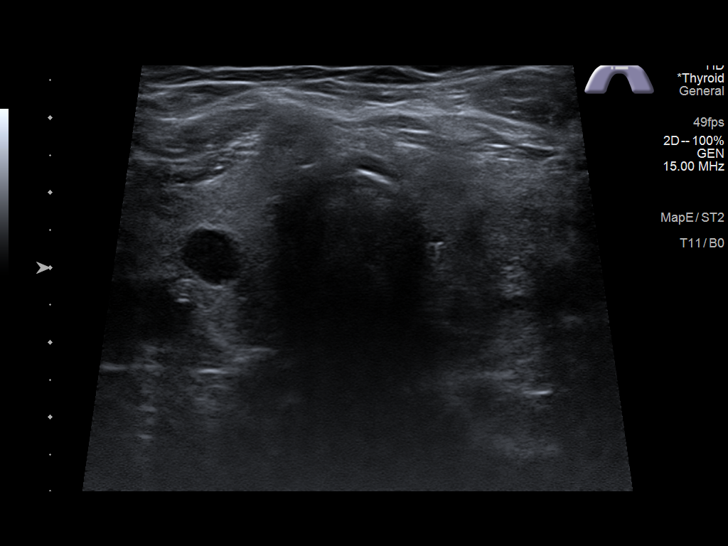
[im 5/51]
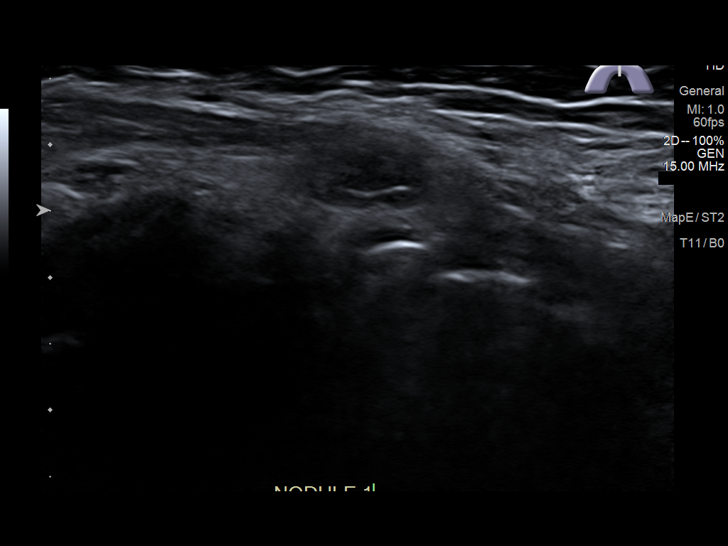
[im 9/51]
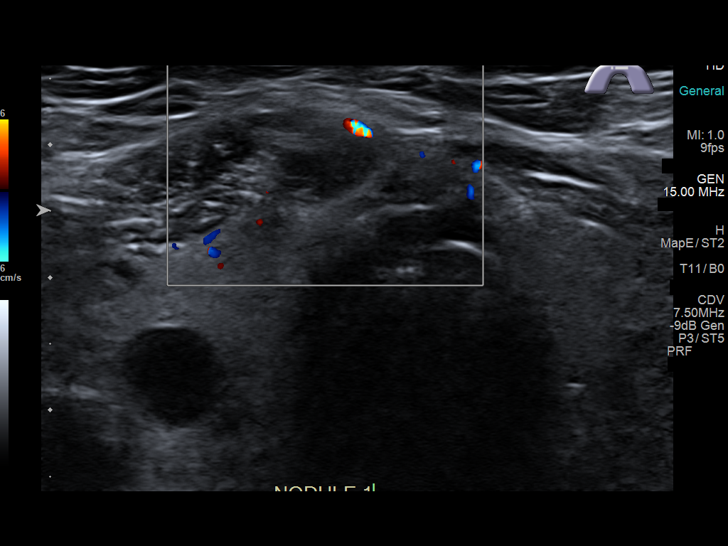
[im 13/51]
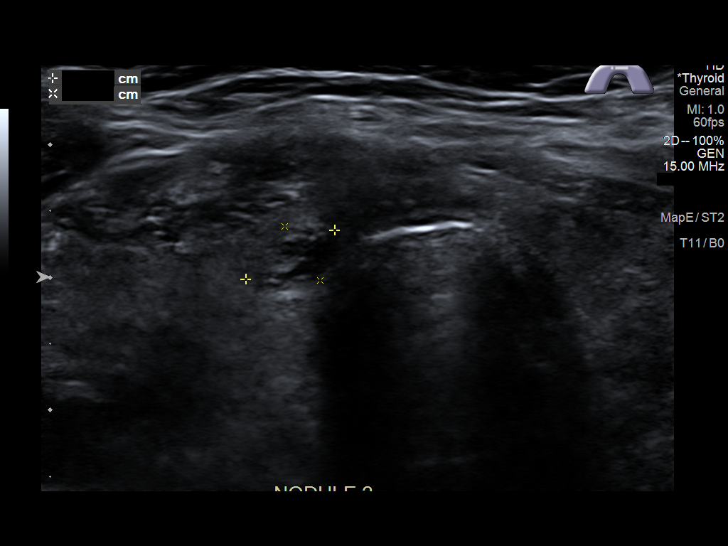
[im 17/51]
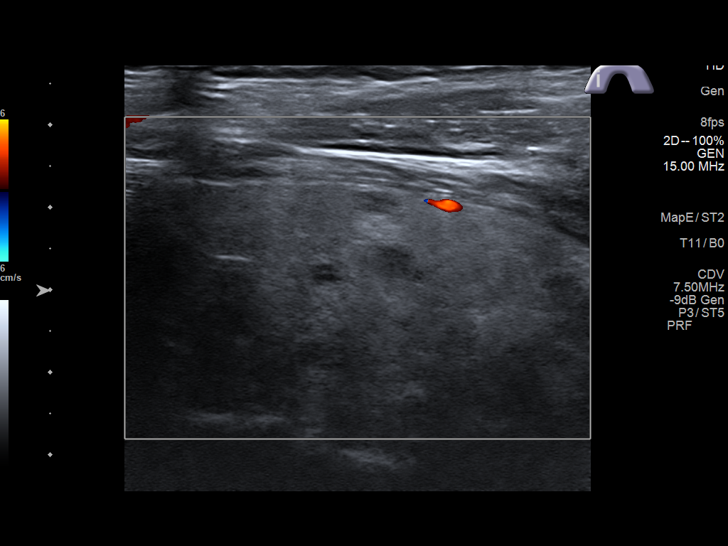
[im 21/51]
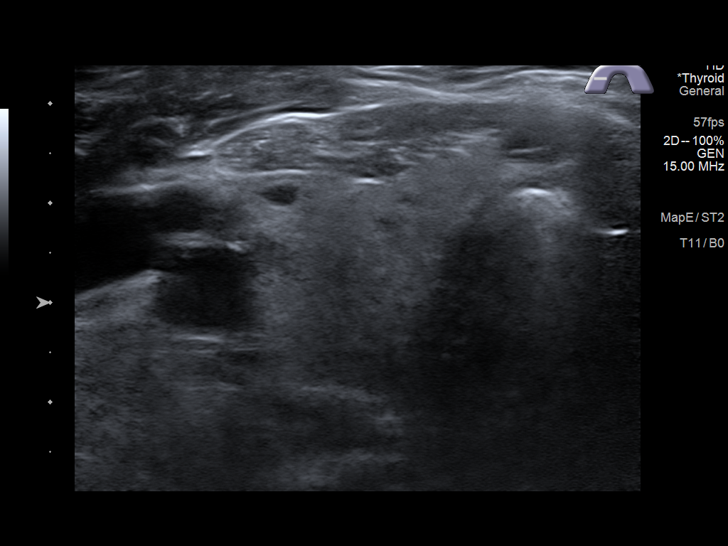
[im 26/51]
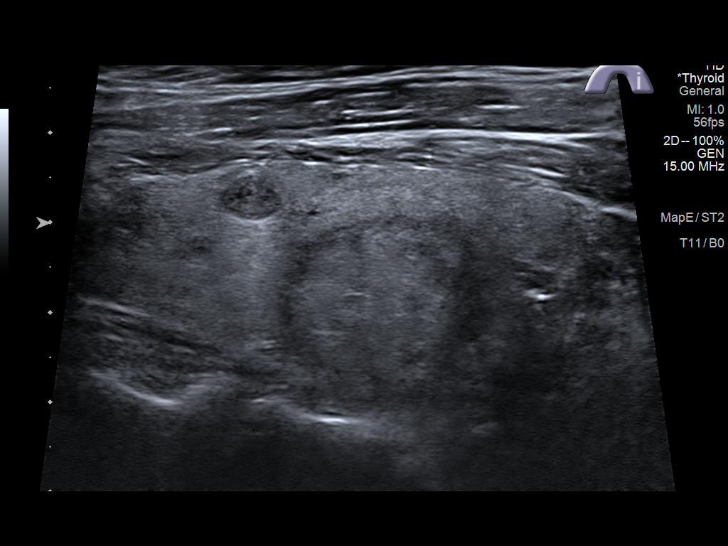
[im 30/51]
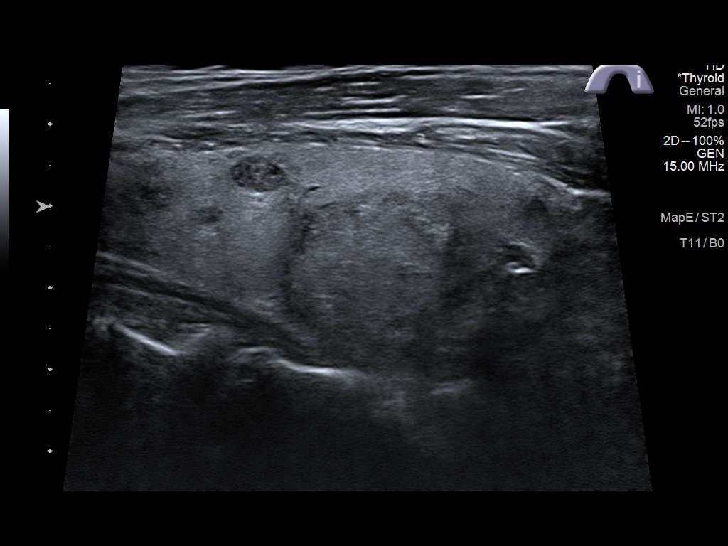
[im 34/51]
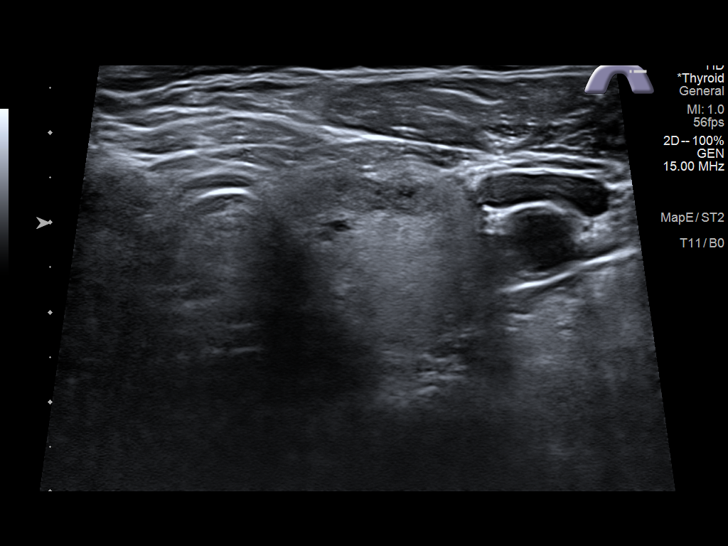
[im 38/51]
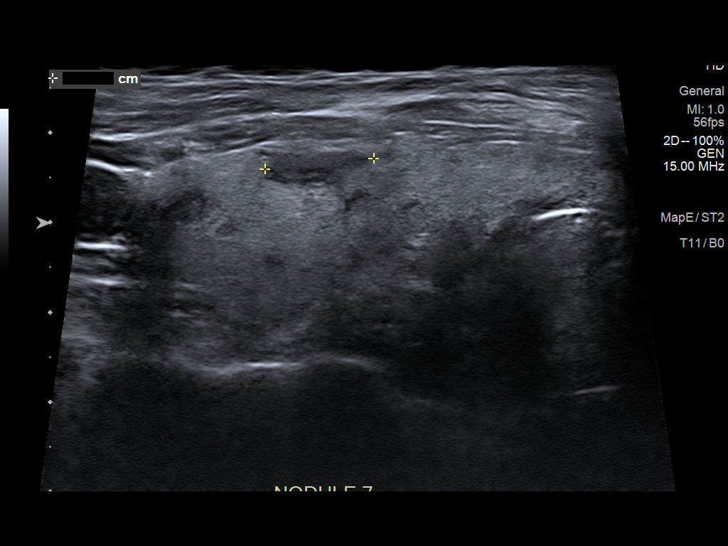
[im 42/51]
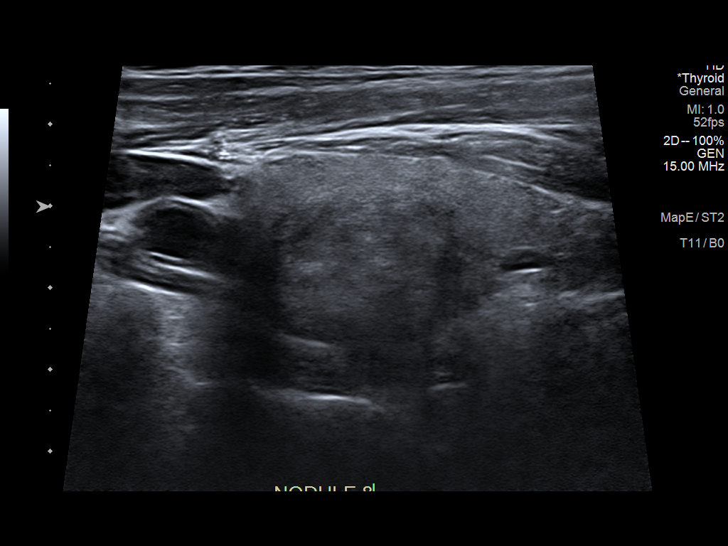
[im 46/51]
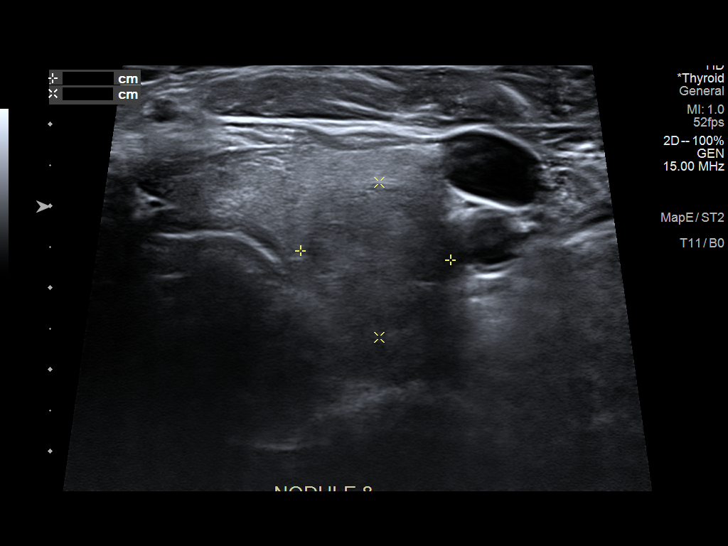
[im 51/51]
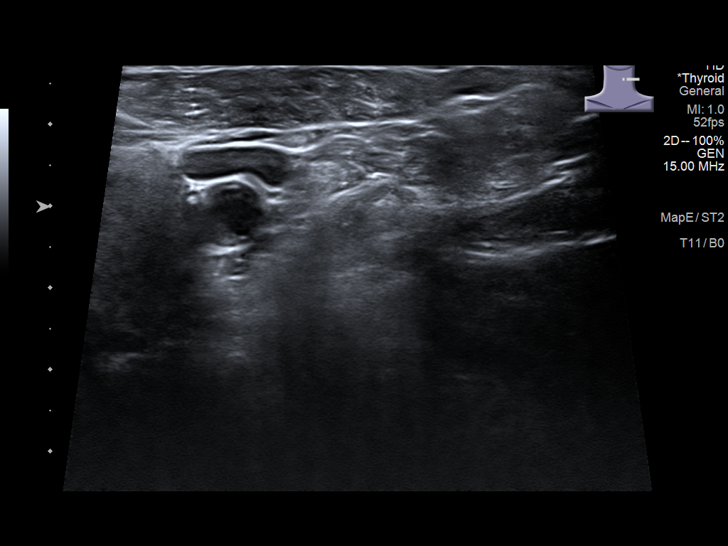

[13 of 25 positions shown; findings below may reference images not displayed]

FINDINGS: Parenchymal Echotexture: Markedly heterogenous

Isthmus: 6 mm

Right lobe: 5.0 x 2.1 x 2.4 cm

Left lobe: 5.6 x 2.6 x 2.4 cm

_________________________________________________________

Estimated total number of nodules >/= 1 cm: 4

Number of spongiform nodules >/=  2 cm not described below (TR1): 0

Number of mixed cystic and solid nodules >/= 1.5 cm not described
below (TR2): 0

_________________________________________________________

Nodule 1: 1 cm mid isthmus TR 4 nodule is stable and warrants
continued follow-up at 1 year.

Nodule 2: 1.1 cm mid isthmus TR 4 nodule is stable and meets
criteria for continued follow-up 1 year.

Nodule 3 (previously 7): 1.2 cm left superior TR 4 nodule meets
criteria for continued follow-up 1 year.

Nodule 4 previously 8: 2.3 cm left inferior TR 4 nodule has been
previously biopsied. Correlate with prior pathology.

Stable gland heterogeneity and additional subcentimeter
benign-appearing cystic nodules noted.

No new or enlarging nodule.
IMPRESSION: Stable heterogeneous thyroid with isthmus and left thyroid TR 4
nodules meeting criteria for continued follow-up at 1 year for a
total of 5 years.

2.3 cm left inferior TR 4 nodule previously biopsied correlate with
prior pathology

Stable gland heterogeneity and subcentimeter benign nodules.

The above is in keeping with the ACR TI-RADS recommendations - [HOSPITAL] 8967;[DATE].

## 2020-05-03 ENCOUNTER — Other Ambulatory Visit: Payer: Self-pay | Admitting: Hospice and Palliative Medicine

## 2020-05-04 ENCOUNTER — Other Ambulatory Visit: Payer: Self-pay | Admitting: Internal Medicine

## 2020-07-20 ENCOUNTER — Other Ambulatory Visit: Payer: Self-pay | Admitting: Internal Medicine

## 2020-08-02 ENCOUNTER — Other Ambulatory Visit: Payer: Self-pay

## 2020-08-02 ENCOUNTER — Encounter: Payer: Self-pay | Admitting: Internal Medicine

## 2020-08-02 ENCOUNTER — Ambulatory Visit: Payer: Managed Care, Other (non HMO) | Admitting: Internal Medicine

## 2020-08-02 VITALS — BP 140/80 | HR 117 | Temp 98.5°F | Resp 16 | Ht 62.5 in | Wt 166.6 lb

## 2020-08-02 DIAGNOSIS — R0602 Shortness of breath: Secondary | ICD-10-CM

## 2020-08-02 DIAGNOSIS — J449 Chronic obstructive pulmonary disease, unspecified: Secondary | ICD-10-CM

## 2020-08-02 DIAGNOSIS — F17219 Nicotine dependence, cigarettes, with unspecified nicotine-induced disorders: Secondary | ICD-10-CM | POA: Diagnosis not present

## 2020-08-02 DIAGNOSIS — G4734 Idiopathic sleep related nonobstructive alveolar hypoventilation: Secondary | ICD-10-CM | POA: Diagnosis not present

## 2020-08-02 DIAGNOSIS — J301 Allergic rhinitis due to pollen: Secondary | ICD-10-CM

## 2020-08-02 NOTE — Patient Instructions (Signed)
Chronic Obstructive Pulmonary Disease  Chronic obstructive pulmonary disease (COPD) is a long-term (chronic) lung problem. When you have COPD, it is hard for air to get in and out of your lungs. Usually the condition gets worse over time, and your lungs will never return to normal. There are things you can do to keep yourself as healthy as possible. What are the causes?  Smoking. This is the most common cause.  Certain genes passed from parent to child (inherited). What increases the risk?  Being exposed to secondhand smoke from cigarettes, pipes, or cigars.  Being exposed to chemicals and other irritants, such as fumes and dust in the work environment.  Having chronic lung conditions or infections. What are the signs or symptoms?  Shortness of breath, especially during physical activity.  A long-term cough with a large amount of thick mucus. Sometimes, the cough may not have any mucus (dry cough).  Wheezing.  Breathing quickly.  Skin that looks gray or blue, especially in the fingers, toes, or lips.  Feeling tired (fatigue).  Weight loss.  Chest tightness.  Having infections often.  Episodes when breathing symptoms become much worse (exacerbations). At the later stages of this disease, you may have swelling in the ankles, feet, or legs. How is this treated?  Taking medicines.  Quitting smoking, if you smoke.  Rehabilitation. This includes steps to make your body work better. It may involve a team of specialists.  Doing exercises.  Making changes to your diet.  Using oxygen.  Lung surgery.  Lung transplant.  Comfort measures (palliative care). Follow these instructions at home: Medicines  Take over-the-counter and prescription medicines only as told by your doctor.  Talk to your doctor before taking any cough or allergy medicines. You may need to avoid medicines that cause your lungs to be dry. Lifestyle  If you smoke, stop smoking. Smoking makes the  problem worse.  Do not smoke or use any products that contain nicotine or tobacco. If you need help quitting, ask your doctor.  Avoid being around things that make your breathing worse. This may include smoke, chemicals, and fumes.  Stay active, but remember to rest as well.  Learn and use tips on how to manage stress and control your breathing.  Make sure you get enough sleep. Most adults need at least 7 hours of sleep every night.  Eat healthy foods. Eat smaller meals more often. Rest before meals. Controlled breathing Learn and use tips on how to control your breathing as told by your doctor. Try:  Breathing in (inhaling) through your nose for 1 second. Then, pucker your lips and breath out (exhale) through your lips for 2 seconds.  Putting one hand on your belly (abdomen). Breathe in slowly through your nose for 1 second. Your hand on your belly should move out. Pucker your lips and breathe out slowly through your lips. Your hand on your belly should move in as you breathe out.   Controlled coughing Learn and use controlled coughing to clear mucus from your lungs. Follow these steps: 1. Lean your head a little forward. 2. Breathe in deeply. 3. Try to hold your breath for 3 seconds. 4. Keep your mouth slightly open while coughing 2 times. 5. Spit any mucus out into a tissue. 6. Rest and do the steps again 1 or 2 times as needed. General instructions  Make sure you get all the shots (vaccines) that your doctor recommends. Ask your doctor about a flu shot and a pneumonia shot.    Use oxygen therapy and pulmonary rehabilitation if told by your doctor. If you need home oxygen therapy, ask your doctor if you should buy a tool to measure your oxygen level (oximeter).  Make a COPD action plan with your doctor. This helps you to know what to do if you feel worse than usual.  Manage any other conditions you have as told by your doctor.  Avoid going outside when it is very hot, cold, or  humid.  Avoid people who have a sickness you can catch (contagious).  Keep all follow-up visits. Contact a doctor if:  You cough up more mucus than usual.  There is a change in the color or thickness of the mucus.  It is harder to breathe than usual.  Your breathing is faster than usual.  You have trouble sleeping.  You need to use your medicines more often than usual.  You have trouble doing your normal activities such as getting dressed or walking around the house. Get help right away if:  You have shortness of breath while resting.  You have shortness of breath that stops you from: ? Being able to talk. ? Doing normal activities.  Your chest hurts for longer than 5 minutes.  Your skin color is more blue than usual.  Your pulse oximeter shows that you have low oxygen for longer than 5 minutes.  You have a fever.  You feel too tired to breathe normally. These symptoms may represent a serious problem that is an emergency. Do not wait to see if the symptoms will go away. Get medical help right away. Call your local emergency services (911 in the [place] Do not drive yourself to the hospital. Summary  Chronic obstructive pulmonary disease (COPD) is a long-term lung problem.  The way your lungs work will never return to normal. Usually the condition gets worse over time. There are things you can do to keep yourself as healthy as possible.  Take over-the-counter and prescription medicines only as told by your doctor.  If you smoke, stop. Smoking makes the problem worse. This information is not intended to replace advice given to you by your health care provider. Make sure you discuss any questions you have with your health care provider. Document Revised: 02/09/2020 Document Reviewed: 02/09/2020 Elsevier Patient Education  2021 Elsevier Inc.   

## 2020-08-02 NOTE — Progress Notes (Signed)
[facility] [address] [place], [place] [address]  Pulmonary Sleep Medicine   Office Visit Note  Patient Name: Kristi Walter DOB: 10-23-1960 MRN [id number]  Date of Service: 08/02/2020  Complaints/HPI: COPD. She states that she is still smoking on very rare occasion. She has no cough noted allergy have been tough. She is taking OTC meds for this. No fevers or chills. No chest pain. She has no sputum  ROS  General: (-) fever, (-) chills, (-) night sweats, (-) weakness Skin: (-) rashes, (-) itching,. Eyes: (-) visual changes, (-) redness, (-) itching. Nose and Sinuses: (-) nasal stuffiness or itchiness, (-) postnasal drip, (-) nosebleeds, (-) sinus trouble. Mouth and Throat: (-) sore throat, (-) hoarseness. Neck: (-) swollen glands, (-) enlarged thyroid, (-) neck pain. Respiratory: - cough, (-) bloody sputum, + shortness of breath, - wheezing. Cardiovascular: - ankle swelling, (-) chest pain. Lymphatic: (-) lymph node enlargement. Neurologic: (-) numbness, (-) tingling. Psychiatric: (-) anxiety, (-) depression   Current Medication: Outpatient Encounter Medications as of 08/02/2020  Medication Sig  . albuterol (VENTOLIN HFA) 108 (90 Base) MCG/ACT inhaler INHALE 1 TO 2 INHALATIONS  BY MOUTH INTO THE LUNGS  EVERY 4 HOURS AS NEEDED FOR WHEEZING OR SHORTNESS OF  BREATH  . benzonatate (TESSALON) 100 MG capsule TAKE 1 CAPSULE BY MOUTH  EVERY 12 HOURS AS NEEDED  FOR COUGH  . EPINEPHrine 0.3 mg/0.3 mL IJ SOAJ injection Inject 0.3 mg into the muscle once.  Marland Kitchen ipratropium-albuterol (DUONEB) 0.5-2.5 (3) MG/3ML SOLN USE 1 VIAL VIA NEBULIZER  EVERY 6 HOURS AS NEEDED  . levofloxacin (LEVAQUIN) 500 MG tablet Take 1 tablet (500 mg total) by mouth daily.  . montelukast (SINGULAIR) 10 MG tablet TAKE 1 TABLET BY MOUTH  DAILY  . omeprazole (PRILOSEC) 20 MG capsule TAKE 1 CAPSULE BY MOUTH  DAILY  . OXYGEN Inhale 5 L into the lungs. At night  . predniSONE (DELTASONE) 10 MG tablet Use per  dose pack  . SPIRIVA HANDIHALER 18 MCG inhalation capsule INHALE THE CONTENTS OF 1  CAPSULE BY MOUTH VIA  HANDIHALER ONCE DAILY  . SYMBICORT 160-4.5 MCG/ACT inhaler USE 2 INHALATIONS BY MOUTH  TWICE DAILY  . theophylline (UNIPHYL) 400 MG 24 hr tablet Take 1 tablet (400 mg total) by mouth daily.  . varenicline (CHANTIX CONTINUING MONTH PAK) 1 MG tablet TAKE AS DIRECTED   No facility-administered encounter medications on file as of 08/02/2020.    Surgical History: Past Surgical History:  Procedure Laterality Date  . CATARACT EXTRACTION W/PHACO Left 06/04/2017   Procedure: CATARACT EXTRACTION PHACO AND INTRAOCULAR LENS PLACEMENT (IOC);  Surgeon: Galen Manila, MD;  Location: [facility] ORS;  Service: Ophthalmology;  Laterality: Left;  [place] 00:58.5 AP% 22.3 CDE 13.03 Fluid Pack Lot # [id number]  . CESAREAN SECTION    . EYE SURGERY    . FOOT SURGERY    . pyloric     stenosis surgery  . THROAT SURGERY    . VAGINAL HYSTERECTOMY  1989    Medical History: Past Medical History:  Diagnosis Date  . Asthma   . COPD (chronic obstructive pulmonary disease) (HCC)   . Dyspnea   . GERD (gastroesophageal reflux disease)   . Orthopnea   . Pneumonia   . Wheezing     Family History: Family History  Problem Relation Age of Onset  . Hyperlipidemia Mother   . Hypertension Mother   . Stroke Mother     Social History: Social History   Socioeconomic History  .  Marital status: Widowed    Spouse name: Not on file  . Number of children: Not on file  . Years of education: Not on file  . Highest education level: Not on file  Occupational History  . Not on file  Tobacco Use  . Smoking status: Former Smoker    Packs/day: 0.25    Years: 30.00    Pack years: 7.50    Types: Cigarettes    Quit date: 07/30/2017    Years since quitting: 3.0  . Smokeless tobacco: Never Used  Vaping Use  . Vaping Use: Never used  Substance and Sexual Activity  . Alcohol use: No  . Drug use: No  . Sexual activity:  Not on file  Other Topics Concern  . Not on file  Social History Narrative  . Not on file   Social Determinants of Health   Financial Resource Strain: Not on file  Food Insecurity: Not on file  Transportation Needs: Not on file  Physical Activity: Not on file  Stress: Not on file  Social Connections: Not on file  Intimate Partner Violence: Not on file    Vital Signs: Blood pressure 140/80, pulse (!) 117, temperature 98.5 F (36.9 C), resp. rate 16, height 5' 2.5" (1.588 m), weight 166 lb 9.6 oz (75.6 kg), SpO2 97 %.  Examination: General Appearance: The patient is well-developed, well-nourished, and in no distress. Skin: Gross inspection of skin unremarkable. Head: normocephalic, no gross deformities. Eyes: no gross deformities noted. ENT: ears appear grossly normal no exudates. Neck: Supple. No thyromegaly. No LAD. Respiratory: few distant rhonchi. Cardiovascular: Normal S1 and S2 without murmur or rub. Extremities: No cyanosis. pulses are equal. Neurologic: Alert and oriented. No involuntary movements.  LABS: No results found for this or any previous visit (from the past 2160 hour(s)).   No results found.  No results found.    Assessment and Plan: Patient Active Problem List   Diagnosis Date Noted  . Acute respiratory failure with hypoxemia (HCC) 05/24/2016  . SVT (supraventricular tachycardia) (HCC) 04/27/2014  . Gastro-esophageal reflux disease without esophagitis 04/26/2014  . COPD with asthma (HCC) 04/26/2014  . Low back sprain 09/15/2013  . Current tobacco use 03/06/2013  . Allergic rhinitis 07/13/2010  . Allergic to food 07/13/2010  . Chronic obstructive pulmonary disease (HCC) 08/14/2000    1. SOB (shortness of breath) She is at baseline still with SOB on exertion but not as bad - Spirometry with Graph  2. Chronic obstructive pulmonary disease, unspecified COPD type (HCC) On inhalers will be continued usage reviewed  3. Cigarette nicotine  dependence with nicotine-induced disorder STOP ALL SMOKING  4. Nocturnal hypoxia On oxygen therapy at night  5. Seasonal allergic rhinitis due to pollen PRN OTC antihistamines  General Counseling: I have discussed the findings of the evaluation and examination with Airel.  I have also discussed any further diagnostic evaluation thatmay be needed or ordered today. Andreah verbalizes understanding of the findings of todays visit. We also reviewed her medications today and discussed drug interactions and side effects including but not limited excessive drowsiness and altered mental states. We also discussed that there is always a risk not just to her but also people around her. she has been encouraged to call the office with any questions or concerns that should arise related to todays visit.  Orders Placed This Encounter  Procedures  . Spirometry with Graph    Order Specific Question:   Where should this test be performed?    Answer:  Other     Time spent: 60  I have personally obtained a history, examined the patient, evaluated laboratory and imaging results, formulated the assessment and plan and placed orders.    Yevonne Pax, MD [facility] Pulmonary and Critical Care Sleep medicine

## 2020-09-07 ENCOUNTER — Other Ambulatory Visit: Payer: Self-pay | Admitting: Internal Medicine

## 2020-09-07 ENCOUNTER — Other Ambulatory Visit: Payer: Self-pay | Admitting: Nurse Practitioner

## 2020-09-08 ENCOUNTER — Other Ambulatory Visit: Payer: Self-pay

## 2020-09-08 DIAGNOSIS — Z87891 Personal history of nicotine dependence: Secondary | ICD-10-CM

## 2020-09-08 MED ORDER — OMEPRAZOLE 20 MG PO CPDR: 1.0000 | DELAYED_RELEASE_CAPSULE | Freq: Every day | ORAL | 3 refills | Status: DC

## 2020-09-08 MED ORDER — VARENICLINE TARTRATE 1 MG PO TABS: ORAL_TABLET | ORAL | 1 refills | Status: DC

## 2020-09-08 MED ORDER — ALBUTEROL SULFATE HFA 108 (90 BASE) MCG/ACT IN AERS: INHALATION_SPRAY | RESPIRATORY_TRACT | 3 refills | Status: DC

## 2020-09-23 ENCOUNTER — Other Ambulatory Visit: Payer: Self-pay

## 2020-09-23 MED ORDER — ALBUTEROL SULFATE HFA 108 (90 BASE) MCG/ACT IN AERS: INHALATION_SPRAY | RESPIRATORY_TRACT | 3 refills | Status: DC

## 2020-09-27 ENCOUNTER — Telehealth: Payer: Self-pay

## 2020-09-27 NOTE — Telephone Encounter (Signed)
Faxed D/C order [facility] for chantix due to backorder and also if pt need a rx refill need appt with DSK to discuss med

## 2020-11-14 ENCOUNTER — Telehealth: Payer: Self-pay

## 2020-11-14 NOTE — Telephone Encounter (Signed)
Left vm to scheduled acute visit-Kristi Walter

## 2020-11-15 ENCOUNTER — Telehealth: Payer: Managed Care, Other (non HMO) | Admitting: Nurse Practitioner

## 2020-11-15 ENCOUNTER — Other Ambulatory Visit: Payer: Self-pay

## 2020-11-15 ENCOUNTER — Encounter: Payer: Self-pay | Admitting: Nurse Practitioner

## 2020-11-15 VITALS — Temp 98.7°F | Resp 16 | Ht 62.5 in | Wt 180.0 lb

## 2020-11-15 DIAGNOSIS — J441 Chronic obstructive pulmonary disease with (acute) exacerbation: Secondary | ICD-10-CM | POA: Diagnosis not present

## 2020-11-15 DIAGNOSIS — R0602 Shortness of breath: Secondary | ICD-10-CM

## 2020-11-15 DIAGNOSIS — J40 Bronchitis, not specified as acute or chronic: Secondary | ICD-10-CM

## 2020-11-15 MED ORDER — LEVOFLOXACIN 500 MG PO TABS: 500.0000 mg | ORAL_TABLET | Freq: Every day | ORAL | 0 refills | Status: DC

## 2020-11-15 MED ORDER — PREDNISONE 10 MG PO TABS: ORAL_TABLET | ORAL | 0 refills | Status: DC

## 2020-11-15 NOTE — Progress Notes (Signed)
[facility] [address] [place], [place] [address]  Internal MEDICINE  Telephone Visit  Patient Name: Kristi Walter  [id number]  [id number]  Date of Service: 11/15/2020  I connected with the patient at 11:30 AM by telephone and verified the patients identity using two identifiers.   I discussed the limitations, risks, security and privacy concerns of performing an evaluation and management service by telephone and the availability of in person appointments. I also discussed with the patient that there may be a patient responsible charge related to the service.  The patient expressed understanding and agrees to proceed.    Chief Complaint  Patient presents with   Acute Visit    Congestion, started Saturday evening, pt wants meds   Telephone Assessment    video   Telephone Screen    [phone]     HPI Kristi Walter presents via telehealth virtual visit for chest congestion that started 3 days ago. She is experiencing chest tightness, wheezing, shortness of breath, and cough. She reports that her oxygen saturation is usually around 95%, but is has been 89-90% recently. She has COPD. She has been coughing up clear sputum.    Current Medication: Outpatient Encounter Medications as of 11/15/2020  Medication Sig   albuterol (PROAIR HFA) 108 (90 Base) MCG/ACT inhaler Inhale 1-2 puffs into the lungs every 4 hrs as needed for wheezing or shortness of breath   albuterol (VENTOLIN HFA) 108 (90 Base) MCG/ACT inhaler INHALE 1 TO 2 INHALATIONS  BY MOUTH INTO THE LUNGS  EVERY 4 HOURS AS NEEDED FOR WHEEZING OR SHORTNESS OF  BREATH   benzonatate (TESSALON) 100 MG capsule TAKE 1 CAPSULE BY MOUTH  EVERY 12 HOURS AS NEEDED  FOR COUGH   EPINEPHrine 0.3 mg/0.3 mL IJ SOAJ injection Inject 0.3 mg into the muscle once.   ipratropium-albuterol (DUONEB) 0.5-2.5 (3) MG/3ML SOLN USE 1 VIAL VIA NEBULIZER  EVERY 6 HOURS AS NEEDED   levofloxacin (LEVAQUIN) 500 MG tablet Take 1 tablet (500 mg total) by mouth  daily.   montelukast (SINGULAIR) 10 MG tablet TAKE 1 TABLET BY MOUTH  DAILY   omeprazole (PRILOSEC) 20 MG capsule Take 1 capsule (20 mg total) by mouth daily.   OXYGEN Inhale 5 L into the lungs. At night   predniSONE (DELTASONE) 10 MG tablet Take one tab 3 x day for 3 days, then take one tab 2 x a day for 3 days and then take one tab a day for 3 days for copd   SPIRIVA HANDIHALER 18 MCG inhalation capsule INHALE THE CONTENTS OF 1  CAPSULE BY MOUTH VIA  HANDIHALER ONCE DAILY   SYMBICORT 160-4.5 MCG/ACT inhaler USE 2 INHALATIONS BY MOUTH  TWICE DAILY   theophylline (UNIPHYL) 400 MG 24 hr tablet Take 1 tablet (400 mg total) by mouth daily.   varenicline (CHANTIX CONTINUING MONTH PAK) 1 MG tablet TAKE AS DIRECTED   [DISCONTINUED] levofloxacin (LEVAQUIN) 500 MG tablet Take 1 tablet (500 mg total) by mouth daily.   [DISCONTINUED] predniSONE (DELTASONE) 10 MG tablet Use per dose pack   No facility-administered encounter medications on file as of 11/15/2020.    Surgical History: Past Surgical History:  Procedure Laterality Date   CATARACT EXTRACTION W/PHACO Left 06/04/2017   Procedure: CATARACT EXTRACTION PHACO AND INTRAOCULAR LENS PLACEMENT (IOC);  Surgeon: Galen Manila, MD;  Location: [facility] ORS;  Service: Ophthalmology;  Laterality: Left;  [place] 00:58.5 AP% 22.3 CDE 13.03 Fluid Pack Lot # [id number]   CESAREAN SECTION     EYE  SURGERY     FOOT SURGERY     pyloric     stenosis surgery   THROAT SURGERY     VAGINAL HYSTERECTOMY  1989    Medical History: Past Medical History:  Diagnosis Date   Asthma    COPD (chronic obstructive pulmonary disease) (HCC)    Dyspnea    GERD (gastroesophageal reflux disease)    Orthopnea    Pneumonia    Wheezing     Family History: Family History  Problem Relation Age of Onset   Hyperlipidemia Mother    Hypertension Mother    Stroke Mother     Social History   Socioeconomic History   Marital status: Widowed    Spouse name: Not on file   Number  of children: Not on file   Years of education: Not on file   Highest education level: Not on file  Occupational History   Not on file  Tobacco Use   Smoking status: Former    Packs/day: 0.25    Years: 30.00    Pack years: 7.50    Types: Cigarettes    Quit date: 07/30/2017    Years since quitting: 3.2   Smokeless tobacco: Never  Vaping Use   Vaping Use: Never used  Substance and Sexual Activity   Alcohol use: No   Drug use: No   Sexual activity: Not on file  Other Topics Concern   Not on file  Social History Narrative   Not on file   Social Determinants of Health   Financial Resource Strain: Not on file  Food Insecurity: Not on file  Transportation Needs: Not on file  Physical Activity: Not on file  Stress: Not on file  Social Connections: Not on file  Intimate Partner Violence: Not on file      Review of Systems  Constitutional:  Negative for chills, fatigue and fever.  HENT: Negative.  Negative for congestion, postnasal drip, sinus pressure, sinus pain, sneezing and sore throat.   Respiratory:  Positive for cough, chest tightness, shortness of breath and wheezing.   Cardiovascular: Negative.  Negative for chest pain and palpitations.  Gastrointestinal: Negative.  Negative for abdominal pain, constipation, diarrhea, nausea and vomiting.  Musculoskeletal: Negative.  Negative for myalgias.  Skin:  Negative for rash.  Neurological: Negative.  Negative for dizziness, light-headedness and headaches.   Vital Signs: Temp 98.7 F (37.1 C)   Resp 16   Ht 5' 2.5" (1.588 m)   Wt 180 lb (81.6 kg)   SpO2 (!) 89%   BMI 32.40 kg/m    Observation/Objective: Kristi Walter is alert and oriented and engages in conversation appropriately. She appears to be in no acute distress.    Assessment/Plan: 1. Bronchitis with wheezing Patient has COPD and is experiencing symptoms bronchitis with wheezing, empiric treatment with levofloxacin ordered.  - levofloxacin (LEVAQUIN) 500 MG tablet;  Take 1 tablet (500 mg total) by mouth daily.  Dispense: 7 tablet; Refill: 0  2. Chronic obstructive pulmonary disease with acute exacerbation (HCC) Prednisone prescribed to decrease inflammation and wheezing.  - levofloxacin (LEVAQUIN) 500 MG tablet; Take 1 tablet (500 mg total) by mouth daily.  Dispense: 7 tablet; Refill: 0 - predniSONE (DELTASONE) 10 MG tablet; Take one tab 3 x day for 3 days, then take one tab 2 x a day for 3 days and then take one tab a day for 3 days for copd  Dispense: 18 tablet; Refill: 0  3. SOB (shortness of breath) Has SOB, is able to  complete a sentence without having to pause to breathe. The SOB is intermittent. She is in no acute distress. She was instructed to call the clinic if her symptoms fail to improve or worsen.    General Counseling: Kristi Walter verbalizes understanding of the findings of today's phone visit and agrees with plan of treatment. I have discussed any further diagnostic evaluation that may be needed or ordered today. We also reviewed her medications today. she has been encouraged to call the office with any questions or concerns that should arise related to todays visit.    No orders of the defined types were placed in this encounter.   Meds ordered this encounter  Medications   levofloxacin (LEVAQUIN) 500 MG tablet    Sig: Take 1 tablet (500 mg total) by mouth daily.    Dispense:  7 tablet    Refill:  0   predniSONE (DELTASONE) 10 MG tablet    Sig: Take one tab 3 x day for 3 days, then take one tab 2 x a day for 3 days and then take one tab a day for 3 days for copd    Dispense:  18 tablet    Refill:  0   Return if symptoms worsen or fail to improve.  Time spent:20 Minutes  This patient was seen by Sallyanne Kuster, FNP-C in collaboration with Dr. Beverely Risen as a part of collaborative care agreement.  Kristi Cammack R. Tedd Sias, MSN, FNP-C Internal medicine

## 2020-11-15 NOTE — Progress Notes (Deleted)
[facility] [address] [place], [place] [address]  Internal MEDICINE  Telephone Visit  Patient Name: Kristi Walter  [id number]  [id number]  Date of Service: 11/15/2020  I connected with the patient at *** by telephone and verified the patients identity using two identifiers.   I discussed the limitations, risks, security and privacy concerns of performing an evaluation and management service by telephone and the availability of in person appointments. I also discussed with the patient that there may be a patient responsible charge related to the service.  The patient expressed understanding and agrees to proceed.    Chief Complaint  Patient presents with   Acute Visit    Congestion, started Saturday evening, pt wants meds   Telephone Assessment    video   Telephone Screen    [phone]     HPI     Current Medication: Outpatient Encounter Medications as of 11/15/2020  Medication Sig   albuterol (PROAIR HFA) 108 (90 Base) MCG/ACT inhaler Inhale 1-2 puffs into the lungs every 4 hrs as needed for wheezing or shortness of breath   albuterol (VENTOLIN HFA) 108 (90 Base) MCG/ACT inhaler INHALE 1 TO 2 INHALATIONS  BY MOUTH INTO THE LUNGS  EVERY 4 HOURS AS NEEDED FOR WHEEZING OR SHORTNESS OF  BREATH   benzonatate (TESSALON) 100 MG capsule TAKE 1 CAPSULE BY MOUTH  EVERY 12 HOURS AS NEEDED  FOR COUGH   EPINEPHrine 0.3 mg/0.3 mL IJ SOAJ injection Inject 0.3 mg into the muscle once.   ipratropium-albuterol (DUONEB) 0.5-2.5 (3) MG/3ML SOLN USE 1 VIAL VIA NEBULIZER  EVERY 6 HOURS AS NEEDED   levofloxacin (LEVAQUIN) 500 MG tablet Take 1 tablet (500 mg total) by mouth daily.   montelukast (SINGULAIR) 10 MG tablet TAKE 1 TABLET BY MOUTH  DAILY   omeprazole (PRILOSEC) 20 MG capsule Take 1 capsule (20 mg total) by mouth daily.   OXYGEN Inhale 5 L into the lungs. At night   predniSONE (DELTASONE) 10 MG tablet Use per dose pack   SPIRIVA HANDIHALER 18 MCG inhalation capsule INHALE THE  CONTENTS OF 1  CAPSULE BY MOUTH VIA  HANDIHALER ONCE DAILY   SYMBICORT 160-4.5 MCG/ACT inhaler USE 2 INHALATIONS BY MOUTH  TWICE DAILY   theophylline (UNIPHYL) 400 MG 24 hr tablet Take 1 tablet (400 mg total) by mouth daily.   varenicline (CHANTIX CONTINUING MONTH PAK) 1 MG tablet TAKE AS DIRECTED   No facility-administered encounter medications on file as of 11/15/2020.    Surgical History: Past Surgical History:  Procedure Laterality Date   CATARACT EXTRACTION W/PHACO Left 06/04/2017   Procedure: CATARACT EXTRACTION PHACO AND INTRAOCULAR LENS PLACEMENT (IOC);  Surgeon: Galen Manila, MD;  Location: [facility] ORS;  Service: Ophthalmology;  Laterality: Left;  [place] 00:58.5 AP% 22.3 CDE 13.03 Fluid Pack Lot # [id number]   CESAREAN SECTION     EYE SURGERY     FOOT SURGERY     pyloric     stenosis surgery   THROAT SURGERY     VAGINAL HYSTERECTOMY  1989    Medical History: Past Medical History:  Diagnosis Date   Asthma    COPD (chronic obstructive pulmonary disease) (HCC)    Dyspnea    GERD (gastroesophageal reflux disease)    Orthopnea    Pneumonia    Wheezing     Family History: Family History  Problem Relation Age of Onset   Hyperlipidemia Mother    Hypertension Mother    Stroke Mother  Social History   Socioeconomic History   Marital status: Widowed    Spouse name: Not on file   Number of children: Not on file   Years of education: Not on file   Highest education level: Not on file  Occupational History   Not on file  Tobacco Use   Smoking status: Former    Packs/day: 0.25    Years: 30.00    Pack years: 7.50    Types: Cigarettes    Quit date: 07/30/2017    Years since quitting: 3.2   Smokeless tobacco: Never  Vaping Use   Vaping Use: Never used  Substance and Sexual Activity   Alcohol use: No   Drug use: No   Sexual activity: Not on file  Other Topics Concern   Not on file  Social History Narrative   Not on file   Social Determinants of Health    Financial Resource Strain: Not on file  Food Insecurity: Not on file  Transportation Needs: Not on file  Physical Activity: Not on file  Stress: Not on file  Social Connections: Not on file  Intimate Partner Violence: Not on file      Review of Systems  Vital Signs: Temp 98.7 F (37.1 C)   Resp 16   Ht 5' 2.5" (1.588 m)   Wt 180 lb (81.6 kg)   SpO2 (!) 89%   BMI 32.40 kg/m    Observation/Objective:     Assessment/Plan:   General Counseling: Kristi Walter verbalizes understanding of the findings of today's phone visit and agrees with plan of treatment. I have discussed any further diagnostic evaluation that may be needed or ordered today. We also reviewed her medications today. she has been encouraged to call the office with any questions or concerns that should arise related to todays visit.    No orders of the defined types were placed in this encounter.   No orders of the defined types were placed in this encounter.   Time spent:*** Minutes    Dr Lyndon Code Internal medicine

## 2020-11-18 ENCOUNTER — Other Ambulatory Visit: Payer: Self-pay | Admitting: Internal Medicine

## 2020-11-25 ENCOUNTER — Telehealth: Payer: Self-pay

## 2020-11-25 ENCOUNTER — Other Ambulatory Visit: Payer: Self-pay

## 2020-11-25 DIAGNOSIS — J441 Chronic obstructive pulmonary disease with (acute) exacerbation: Secondary | ICD-10-CM

## 2020-11-25 MED ORDER — PREDNISONE 10 MG PO TABS: ORAL_TABLET | ORAL | 0 refills | Status: DC

## 2020-11-25 NOTE — Telephone Encounter (Signed)
Spoke with pt she still having congestion as per alyssa send another round of prednisone and advised continue all inhaler

## 2020-12-29 ENCOUNTER — Other Ambulatory Visit: Payer: Self-pay | Admitting: Surgery

## 2021-01-04 ENCOUNTER — Other Ambulatory Visit: Payer: Self-pay

## 2021-01-04 ENCOUNTER — Encounter
Admission: RE | Admit: 2021-01-04 | Discharge: 2021-01-04 | Disposition: A | Discharge status: Home / Self Care | Payer: Managed Care, Other (non HMO) | Source: Ambulatory Visit | Attending: Surgery | Admitting: Surgery

## 2021-01-04 NOTE — Patient Instructions (Addendum)
Your procedure is scheduled on: Tuesday, September 27 Report to the Registration Desk on the 1st floor of the [facility] To find out your arrival time, please call [phone] between 1PM - 3PM on: Monday, September 26   REMEMBER: Instructions that are not followed completely may result in serious medical risk, up to and including death; or upon the discretion of your surgeon and anesthesiologist your surgery may need to be rescheduled.  Do not eat food after midnight the night before surgery.  No gum chewing, lozengers or hard candies.  You may however, drink CLEAR liquids up to 2 hours before you are scheduled to arrive for your surgery. Do not drink anything within 2 hours of your scheduled arrival time.  Clear liquids include: - water  - apple juice without pulp - gatorade (not RED, PURPLE, OR BLUE) - black coffee or tea (Do NOT add milk or creamers to the coffee or tea) Do NOT drink anything that is not on this list.  In addition, your doctor has ordered for you to drink the provided  Ensure Pre-Surgery Clear Carbohydrate Drink  Drinking this carbohydrate drink up to two hours before surgery helps to reduce insulin resistance and improve patient outcomes. Please complete drinking 2 hours prior to scheduled arrival time.  TAKE THESE MEDICATIONS THE MORNING OF SURGERY WITH A SIP OF WATER:  Duoneb nebulizer Omeprazole (Prilosec) - (take one the night before and one on the morning of surgery - helps to prevent nausea after surgery.) Symbicort inhaler Theophylline   Use inhalers on the day of surgery and bring the albuterol inhaler to the hospital.  One week prior to surgery: starting September 20 Stop Anti-inflammatories (NSAIDS) such as Advil, Aleve, Ibuprofen, Motrin, Naproxen, Naprosyn and Aspirin based products such as Excedrin, Goodys Powder, BC Powder. Stop ANY OVER THE COUNTER supplements until after surgery. You may however, continue to take Tylenol if needed for  pain up until the day of surgery.  No Alcohol for 24 hours before or after surgery.  No Smoking including e-cigarettes for 24 hours prior to surgery.  No chewable tobacco products for at least 6 hours prior to surgery.  No nicotine patches on the day of surgery.  Do not use any "recreational" drugs for at least a week prior to your surgery.  Please be advised that the combination of cocaine and anesthesia may have negative outcomes, up to and including death. If you test positive for cocaine, your surgery will be cancelled.  On the morning of surgery brush your teeth with toothpaste and water, you may rinse your mouth with mouthwash if you wish. Do not swallow any toothpaste or mouthwash.  Use CHG Soap as directed on instruction sheet.  Do not wear jewelry, make-up, hairpins, clips or nail polish.  Do not wear lotions, powders, or perfumes.   Do not shave body from the neck down 48 hours prior to surgery just in case you cut yourself which could leave a site for infection.  Also, freshly shaved skin may become irritated if using the CHG soap.  Contact lenses, hearing aids and dentures may not be worn into surgery.  Do not bring valuables to the hospital. [facility] is not responsible for any missing/lost belongings or valuables.   Notify your doctor if there is any change in your medical condition (cold, fever, infection).  Wear comfortable clothing (specific to your surgery type) to the hospital.  After surgery, you can help prevent lung complications by doing breathing exercises.  Take deep breaths and cough every 1-2 hours. Your doctor may order a device called an Incentive Spirometer to help you take deep breaths.  If you are being discharged the day of surgery, you will not be allowed to drive home. You will need a responsible adult (18 years or older) to drive you home and stay with you that night.   If you are taking public transportation, you will need to have a  responsible adult (18 years or older) with you. Please confirm with your physician that it is acceptable to use public transportation.   Please call the Pre-admissions Testing Dept. at [phone] if you have any questions about these instructions.  Surgery Visitation Policy:  Patients undergoing a surgery or procedure may have one family member or support person with them as long as that person is not COVID-19 positive or experiencing its symptoms.  That person may remain in the waiting area during the procedure and may rotate out with other people.

## 2021-01-05 ENCOUNTER — Ambulatory Visit: Payer: Managed Care, Other (non HMO) | Admitting: Internal Medicine

## 2021-01-05 ENCOUNTER — Encounter: Payer: Self-pay | Admitting: Internal Medicine

## 2021-01-05 ENCOUNTER — Telehealth: Payer: Self-pay

## 2021-01-05 VITALS — BP 104/76 | HR 90 | Temp 98.2°F | Resp 16 | Ht 62.5 in | Wt 161.0 lb

## 2021-01-05 DIAGNOSIS — R0602 Shortness of breath: Secondary | ICD-10-CM | POA: Diagnosis not present

## 2021-01-05 DIAGNOSIS — G4734 Idiopathic sleep related nonobstructive alveolar hypoventilation: Secondary | ICD-10-CM | POA: Diagnosis not present

## 2021-01-05 DIAGNOSIS — J449 Chronic obstructive pulmonary disease, unspecified: Secondary | ICD-10-CM | POA: Diagnosis not present

## 2021-01-05 DIAGNOSIS — F17219 Nicotine dependence, cigarettes, with unspecified nicotine-induced disorders: Secondary | ICD-10-CM | POA: Diagnosis not present

## 2021-01-05 DIAGNOSIS — Z01811 Encounter for preprocedural respiratory examination: Secondary | ICD-10-CM

## 2021-01-05 NOTE — Progress Notes (Signed)
[facility] [address] [place], [place] [address]  Pulmonary Sleep Medicine   Office Visit Note  Patient Name: Kristi Walter DOB: [birth year] MRN [id number]  Date of Service: 01/05/2021  Complaints/HPI: COPD. She has severe disease and states that she has stoppped smoking sincemarch but is still exposed to second hand smoke. She states she is taking her inhalers as prescribed. No cardiac arrhythmias noted. She states that she has no cough noted no congestion at this time. Apparently scheduled for hand surgery and was here for clearence. She had PFT done in 2021 showing FEV1 of 0.58L and spiro from today is about the same no improvement. She is HIGH RISK for compliacations from general anesthesia  ROS  General: (-) fever, (-) chills, (-) night sweats, (-) weakness Skin: (-) rashes, (-) itching,. Eyes: (-) visual changes, (-) redness, (-) itching. Nose and Sinuses: (-) nasal stuffiness or itchiness, (-) postnasal drip, (-) nosebleeds, (-) sinus trouble. Mouth and Throat: (-) sore throat, (-) hoarseness. Neck: (-) swollen glands, (-) enlarged thyroid, (-) neck pain. Respiratory: + cough, (-) bloody sputum, + shortness of breath, - wheezing. Cardiovascular: - ankle swelling, (-) chest pain. Lymphatic: (-) lymph node enlargement. Neurologic: (-) numbness, (-) tingling. Psychiatric: (-) anxiety, (-) depression   Current Medication: Outpatient Encounter Medications as of 01/05/2021  Medication Sig   albuterol (PROAIR HFA) 108 (90 Base) MCG/ACT inhaler Inhale 1-2 puffs into the lungs every 4 hrs as needed for wheezing or shortness of breath (Patient taking differently: Inhale 2 puffs into the lungs every 4 (four) hours as needed for wheezing or shortness of breath.)   benzonatate (TESSALON) 100 MG capsule TAKE 1 CAPSULE BY MOUTH  EVERY 12 HOURS AS NEEDED  FOR COUGH   EPINEPHrine 0.3 mg/0.3 mL IJ SOAJ injection Inject 0.3 mg into the muscle as needed for anaphylaxis.    ipratropium-albuterol (DUONEB) 0.5-2.5 (3) MG/3ML SOLN USE 1 VIAL VIA NEBULIZER  EVERY 6 HOURS AS NEEDED   montelukast (SINGULAIR) 10 MG tablet TAKE 1 TABLET BY MOUTH  DAILY   omeprazole (PRILOSEC) 20 MG capsule Take 1 capsule (20 mg total) by mouth daily.   OXYGEN Inhale 5 L into the lungs. At night   Polyethyl Glycol-Propyl Glycol (LUBRICANT EYE DROPS) 0.4-0.3 % SOLN Place 1-2 drops into both eyes 3 (three) times daily as needed (dry/irritated eyes).   SPIRIVA HANDIHALER 18 MCG inhalation capsule INHALE THE CONTENTS OF 1  CAPSULE BY MOUTH VIA  HANDIHALER ONCE DAILY (Patient taking differently: Place 18 mcg into inhaler and inhale at bedtime. INHALE THE CONTENTS OF 1  CAPSULE BY MOUTH VIA  HANDIHALER ONCE DAILY)   SYMBICORT 160-4.5 MCG/ACT inhaler USE 2 INHALATIONS BY MOUTH  TWICE DAILY   theophylline (UNIPHYL) 400 MG 24 hr tablet Take 1 tablet (400 mg total) by mouth daily.   No facility-administered encounter medications on file as of 01/05/2021.    Surgical History: Past Surgical History:  Procedure Laterality Date   ABDOMINAL HYSTERECTOMY  1989   Cardiac laser ablation  2015   [facility]   CATARACT EXTRACTION [facility] Left 06/04/2017   Procedure: CATARACT EXTRACTION PHACO AND INTRAOCULAR LENS PLACEMENT (IOC);  Surgeon: Galen Manila, MD;  Location: [facility] ORS;  Service: Ophthalmology;  Laterality: Left;  [place] 00:58.5 AP% 22.3 CDE 13.03 Fluid Pack Lot # [id number]   CATARACT EXTRACTION W/PHACO Right 2020   CESAREAN SECTION     x2   FOOT SURGERY Left    tendon laceration repair   pyloric  stenosis surgery    Medical History: Past Medical History:  Diagnosis Date   Asthma    COPD (chronic obstructive pulmonary disease) (HCC)    Dyspnea    GERD (gastroesophageal reflux disease)    Orthopnea    Pneumonia    Wheezing     Family History: Family History  Problem Relation Age of Onset   Hyperlipidemia Mother    Hypertension Mother    Stroke Mother     Social  History: Social History   Socioeconomic History   Marital status: Widowed    Spouse name: Not on file   Number of children: Not on file   Years of education: Not on file   Highest education level: Not on file  Occupational History   Not on file  Tobacco Use   Smoking status: Former    Packs/day: 0.25    Years: 30.00    Pack years: 7.50    Types: Cigarettes    Quit date: 07/30/2017    Years since quitting: 3.4   Smokeless tobacco: Never  Vaping Use   Vaping Use: Never used  Substance and Sexual Activity   Alcohol use: No   Drug use: No   Sexual activity: Not on file  Other Topics Concern   Not on file  Social History Narrative   Lives with 2 sons   Social Determinants of Health   Financial Resource Strain: Not on file  Food Insecurity: Not on file  Transportation Needs: Not on file  Physical Activity: Not on file  Stress: Not on file  Social Connections: Not on file  Intimate Partner Violence: Not on file    Vital Signs: Blood pressure 104/76, pulse 90, temperature 98.2 F (36.8 C), resp. rate 16, height 5' 2.5" (1.588 m), weight 161 lb (73 kg), SpO2 96 %.  Examination: General Appearance: The patient is well-developed, well-nourished, and in no distress. Skin: Gross inspection of skin unremarkable. Head: normocephalic, no gross deformities. Eyes: no gross deformities noted. ENT: ears appear grossly normal no exudates. Neck: Supple. No thyromegaly. No LAD. Respiratory: diminished few rhonchi noted. Cardiovascular: Normal S1 and S2 without murmur or rub. Extremities: No cyanosis. pulses are equal. Neurologic: Alert and oriented. No involuntary movements.  LABS: No results found for this or any previous visit (from the past 2160 hour(s)).  Radiology: No results found.  No results found.  No results found.    Assessment and Plan: Patient Active Problem List   Diagnosis Date Noted   Acute respiratory failure with hypoxemia (HCC) 05/24/2016   SVT  (supraventricular tachycardia) (HCC) 04/27/2014   Gastro-esophageal reflux disease without esophagitis 04/26/2014   COPD with asthma (HCC) 04/26/2014   Low back sprain 09/15/2013   Current tobacco use 03/06/2013   Allergic rhinitis 07/13/2010   Allergic to food 07/13/2010   Chronic obstructive pulmonary disease (HCC) 08/14/2000    1. SOB (shortness of breath) She is at baseline with her COPD reviewed the spirometry with her in detail - Spirometry with Graph  2. Chronic obstructive pulmonary disease, unspecified COPD type (HCC) We will continue with supportive care medical management inhalers were discussed with her once again.  3. Cigarette nicotine dependence with nicotine-induced disorder States she is not currently smoking but is around secondhand smoke.  Would again encourage her to ask her son to smoke outdoors  4. Nocturnal hypoxia Oxygen therapy at nighttime  5. Preop pulmonary/respiratory exam She is scheduled for surgery she remains high risk for general anesthesia.  If the procedure can  be done under spinal or local that would be preferred she understands the risks that are involved and the benefits that are involved.    General Counseling: I have discussed the findings of the evaluation and examination with Loral.  I have also discussed any further diagnostic evaluation thatmay be needed or ordered today. Nesha verbalizes understanding of the findings of todays visit. We also reviewed her medications today and discussed drug interactions and side effects including but not limited excessive drowsiness and altered mental states. We also discussed that there is always a risk not just to her but also people around her. she has been encouraged to call the office with any questions or concerns that should arise related to todays visit.  Orders Placed This Encounter  Procedures   Spirometry with Graph    Order Specific Question:   Where should this test be performed?    Answer:    Other     Time spent: 60  I have personally obtained a history, examined the patient, evaluated laboratory and imaging results, formulated the assessment and plan and placed orders.    Yevonne Pax, MD [facility] Pulmonary and Critical Care Sleep medicine

## 2021-01-05 NOTE — Telephone Encounter (Signed)
Surgical clearance signed and faxed back to [facility] w/ [facility] [phone]. Sent to be scanned-Toni

## 2021-01-05 NOTE — Patient Instructions (Signed)

## 2021-01-06 ENCOUNTER — Encounter
Admission: RE | Admit: 2021-01-06 | Discharge: 2021-01-06 | Disposition: A | Discharge status: Home / Self Care | Payer: Managed Care, Other (non HMO) | Source: Ambulatory Visit | Attending: Surgery | Admitting: Surgery

## 2021-01-06 ENCOUNTER — Other Ambulatory Visit: Payer: Self-pay

## 2021-01-06 DIAGNOSIS — J449 Chronic obstructive pulmonary disease, unspecified: Secondary | ICD-10-CM | POA: Insufficient documentation

## 2021-01-06 DIAGNOSIS — Z01818 Encounter for other preprocedural examination: Secondary | ICD-10-CM | POA: Insufficient documentation

## 2021-01-06 LAB — BASIC METABOLIC PANEL
Anion gap: 10 (ref 5–15)
BUN: 9 mg/dL (ref 6–20)
CO2: 32 mmol/L (ref 22–32)
Calcium: 10 mg/dL (ref 8.9–10.3)
Chloride: 95 mmol/L — ABNORMAL LOW (ref 98–111)
Creatinine, Ser: 0.59 mg/dL (ref 0.44–1.00)
GFR, Estimated: >60 mL/min (ref >60)
Glucose, Bld: 78 mg/dL (ref 70–99)
Potassium: 3.8 mmol/L (ref 3.5–5.1)
Sodium: 137 mmol/L (ref 135–145)

## 2021-01-06 LAB — CBC
HCT: 41.3 % (ref 36.0–46.0)
Hemoglobin: 13.4 g/dL (ref 12.0–15.0)
MCH: 30.7 pg (ref 26.0–34.0)
MCHC: 32.4 g/dL (ref 30.0–36.0)
MCV: 94.5 fL (ref 80.0–100.0)
Platelets: 263 10*3/uL (ref 150–400)
RBC: 4.37 MIL/uL (ref 3.87–5.11)
RDW: 12 % (ref 11.5–15.5)
WBC: 7.2 10*3/uL (ref 4.0–10.5)
nRBC: 0 % (ref 0.0–0.2)

## 2021-01-10 ENCOUNTER — Ambulatory Visit: Payer: Managed Care, Other (non HMO) | Admitting: Anesthesiology

## 2021-01-10 ENCOUNTER — Ambulatory Visit
Admission: RE | Admit: 2021-01-10 | Discharge: 2021-01-10 | Disposition: A | Discharge status: Home / Self Care | Payer: Managed Care, Other (non HMO) | Attending: Surgery | Admitting: Surgery

## 2021-01-10 ENCOUNTER — Ambulatory Visit: Payer: Managed Care, Other (non HMO)

## 2021-01-10 ENCOUNTER — Encounter
Admission: RE | Disposition: A | Discharge status: Home / Self Care | Payer: Self-pay | Source: Home / Self Care | Attending: Surgery

## 2021-01-10 ENCOUNTER — Other Ambulatory Visit: Payer: Self-pay

## 2021-01-10 ENCOUNTER — Encounter: Payer: Self-pay | Admitting: Surgery

## 2021-01-10 DIAGNOSIS — Z79899 Other long term (current) drug therapy: Secondary | ICD-10-CM | POA: Diagnosis not present

## 2021-01-10 DIAGNOSIS — Z7951 Long term (current) use of inhaled steroids: Secondary | ICD-10-CM | POA: Insufficient documentation

## 2021-01-10 DIAGNOSIS — Z91030 Bee allergy status: Secondary | ICD-10-CM | POA: Diagnosis not present

## 2021-01-10 DIAGNOSIS — Z87891 Personal history of nicotine dependence: Secondary | ICD-10-CM | POA: Insufficient documentation

## 2021-01-10 DIAGNOSIS — Z9981 Dependence on supplemental oxygen: Secondary | ICD-10-CM | POA: Insufficient documentation

## 2021-01-10 DIAGNOSIS — M720 Palmar fascial fibromatosis [Dupuytren]: Secondary | ICD-10-CM | POA: Insufficient documentation

## 2021-01-10 DIAGNOSIS — R52 Pain, unspecified: Secondary | ICD-10-CM

## 2021-01-10 DIAGNOSIS — J449 Chronic obstructive pulmonary disease, unspecified: Secondary | ICD-10-CM | POA: Diagnosis not present

## 2021-01-10 HISTORY — PX: DUPUYTREN CONTRACTURE RELEASE: SHX1478

## 2021-01-10 SURGERY — RELEASE, DUPUYTREN CONTRACTURE
Anesthesia: Monitor Anesthesia Care | Site: Finger | Laterality: Left

## 2021-01-10 MED ORDER — ORAL CARE MOUTH RINSE: 15.0000 mL | Freq: Once | OROMUCOSAL | Status: AC

## 2021-01-10 MED ORDER — OXYCODONE HCL 5 MG/5ML PO SOLN
5.0000 mg | Freq: Once | ORAL | Status: AC | PRN
Start: 2021-01-10 — End: 2021-01-10

## 2021-01-10 MED ORDER — CHLORHEXIDINE GLUCONATE 0.12 % MT SOLN
OROMUCOSAL | Status: AC
  Filled 2021-01-10: qty 15

## 2021-01-10 MED ORDER — POLYVINYL ALCOHOL 1.4 % OP SOLN
1.0000 [drp] | OPHTHALMIC | Status: DC | PRN
  Administered 2021-01-10: 1 [drp] via OPHTHALMIC
  Filled 2021-01-10 (×2): qty 15

## 2021-01-10 MED ORDER — BUPIVACAINE HCL (PF) 0.5 % IJ SOLN
INTRAMUSCULAR | Status: DC | PRN
  Administered 2021-01-10: 10 mL

## 2021-01-10 MED ORDER — DEXMEDETOMIDINE (PRECEDEX) IN NS 20 MCG/5ML (4 MCG/ML) IV SYRINGE
PREFILLED_SYRINGE | INTRAVENOUS | Status: DC | PRN
  Administered 2021-01-10: 4 ug via INTRAVENOUS
  Administered 2021-01-10: 8 ug via INTRAVENOUS
  Administered 2021-01-10 (×7): 4 ug via INTRAVENOUS

## 2021-01-10 MED ORDER — MIDAZOLAM HCL 2 MG/2ML IJ SOLN
INTRAMUSCULAR | Status: DC | PRN
  Administered 2021-01-10 (×4): .5 mg via INTRAVENOUS

## 2021-01-10 MED ORDER — LACTATED RINGERS IV SOLN: INTRAVENOUS | Status: DC

## 2021-01-10 MED ORDER — FENTANYL CITRATE (PF) 100 MCG/2ML IJ SOLN
INTRAMUSCULAR | Status: AC
  Administered 2021-01-10: 25 ug via INTRAVENOUS
  Filled 2021-01-10: qty 2

## 2021-01-10 MED ORDER — ONDANSETRON HCL 4 MG/2ML IJ SOLN
INTRAMUSCULAR | Status: DC | PRN
  Administered 2021-01-10: 4 mg via INTRAVENOUS

## 2021-01-10 MED ORDER — BUPIVACAINE HCL (PF) 0.5 % IJ SOLN
INTRAMUSCULAR | Status: DC | PRN
  Administered 2021-01-10: 17 mL

## 2021-01-10 MED ORDER — FENTANYL CITRATE PF 50 MCG/ML IJ SOSY
PREFILLED_SYRINGE | INTRAMUSCULAR | Status: AC
  Administered 2021-01-10: 50 ug via INTRAVENOUS
  Filled 2021-01-10: qty 1

## 2021-01-10 MED ORDER — ROPIVACAINE HCL 5 MG/ML IJ SOLN
INTRAMUSCULAR | Status: DC | PRN
  Administered 2021-01-10: 22 mL via PERINEURAL

## 2021-01-10 MED ORDER — BUPIVACAINE HCL (PF) 0.5 % IJ SOLN
INTRAMUSCULAR | Status: AC
  Filled 2021-01-10: qty 30

## 2021-01-10 MED ORDER — FENTANYL CITRATE (PF) 100 MCG/2ML IJ SOLN
25.0000 ug | INTRAMUSCULAR | Status: DC | PRN
  Administered 2021-01-10 (×2): 25 ug via INTRAVENOUS

## 2021-01-10 MED ORDER — ROPIVACAINE HCL 5 MG/ML IJ SOLN
INTRAMUSCULAR | Status: AC
  Filled 2021-01-10: qty 30

## 2021-01-10 MED ORDER — HYDROMORPHONE HCL 1 MG/ML IJ SOLN: 0.5000 mg | INTRAMUSCULAR | Status: DC | PRN

## 2021-01-10 MED ORDER — ACETAMINOPHEN 10 MG/ML IV SOLN
INTRAVENOUS | Status: DC | PRN
  Administered 2021-01-10: 1000 mg via INTRAVENOUS

## 2021-01-10 MED ORDER — ONDANSETRON HCL 4 MG/2ML IJ SOLN: 4.0000 mg | Freq: Once | INTRAMUSCULAR | Status: DC | PRN

## 2021-01-10 MED ORDER — OXYCODONE HCL 5 MG PO TABS
ORAL_TABLET | ORAL | Status: AC
  Administered 2021-01-10: 5 mg via ORAL
  Filled 2021-01-10: qty 1

## 2021-01-10 MED ORDER — PROPOFOL 10 MG/ML IV BOLUS
INTRAVENOUS | Status: DC | PRN
  Administered 2021-01-10: 5 mg via INTRAVENOUS
  Administered 2021-01-10 (×3): 20 mg via INTRAVENOUS
  Administered 2021-01-10: 30 mg via INTRAVENOUS
  Administered 2021-01-10: 20 mg via INTRAVENOUS
  Administered 2021-01-10: 10 mg via INTRAVENOUS
  Administered 2021-01-10: 15 mg via INTRAVENOUS
  Administered 2021-01-10: 10 mg via INTRAVENOUS

## 2021-01-10 MED ORDER — HYDROCODONE-ACETAMINOPHEN 5-325 MG PO TABS: 1.0000 | ORAL_TABLET | Freq: Four times a day (QID) | ORAL | 0 refills | Status: DC | PRN

## 2021-01-10 MED ORDER — CEFAZOLIN SODIUM-DEXTROSE 2-4 GM/100ML-% IV SOLN
INTRAVENOUS | Status: AC
  Filled 2021-01-10: qty 100

## 2021-01-10 MED ORDER — CHLORHEXIDINE GLUCONATE 0.12 % MT SOLN
15.0000 mL | Freq: Once | OROMUCOSAL | Status: AC
  Administered 2021-01-10: 15 mL via OROMUCOSAL

## 2021-01-10 MED ORDER — 0.9 % SODIUM CHLORIDE (POUR BTL) OPTIME
TOPICAL | Status: DC | PRN
  Administered 2021-01-10: 500 mL

## 2021-01-10 MED ORDER — MIDAZOLAM HCL 2 MG/2ML IJ SOLN: 1.0000 mg | Freq: Once | INTRAMUSCULAR | Status: AC

## 2021-01-10 MED ORDER — OXYCODONE HCL 5 MG PO TABS: 5.0000 mg | ORAL_TABLET | Freq: Once | ORAL | Status: AC | PRN

## 2021-01-10 MED ORDER — MIDAZOLAM HCL 2 MG/2ML IJ SOLN
INTRAMUSCULAR | Status: AC
  Administered 2021-01-10: 1 mg via INTRAVENOUS
  Filled 2021-01-10: qty 2

## 2021-01-10 MED ORDER — CEFAZOLIN SODIUM-DEXTROSE 2-4 GM/100ML-% IV SOLN
2.0000 g | INTRAVENOUS | Status: AC
  Administered 2021-01-10: 2 g via INTRAVENOUS

## 2021-01-10 MED ORDER — FENTANYL CITRATE PF 50 MCG/ML IJ SOSY: 50.0000 ug | PREFILLED_SYRINGE | Freq: Once | INTRAMUSCULAR | Status: AC

## 2021-01-10 SURGICAL SUPPLY — 35 items
APL PRP STRL LF DISP 70% ISPRP (MISCELLANEOUS) ×1
BNDG COHESIVE 4X5 TAN ST LF (GAUZE/BANDAGES/DRESSINGS) ×2 IMPLANT
BNDG CONFORM 2 STRL LF (GAUZE/BANDAGES/DRESSINGS) ×4 IMPLANT
BNDG ELASTIC 2X5.8 VLCR STR LF (GAUZE/BANDAGES/DRESSINGS) ×2 IMPLANT
BNDG ELASTIC 3X5.8 VLCR STR LF (GAUZE/BANDAGES/DRESSINGS) ×2 IMPLANT
BNDG ESMARK 4X12 TAN STRL LF (GAUZE/BANDAGES/DRESSINGS) ×2 IMPLANT
CHLORAPREP W/TINT 26 (MISCELLANEOUS) ×2 IMPLANT
CORD BIP STRL DISP 12FT (MISCELLANEOUS) ×2 IMPLANT
CUFF TOURN SGL QUICK 18X4 (TOURNIQUET CUFF) IMPLANT
DRAPE SURG 17X11 SM STRL (DRAPES) ×2 IMPLANT
ELECT REM PT RETURN 9FT ADLT (ELECTROSURGICAL) ×2
ELECTRODE REM PT RTRN 9FT ADLT (ELECTROSURGICAL) ×1 IMPLANT
FORCEPS JEWEL BIP 4-3/4 STR (INSTRUMENTS) ×2 IMPLANT
GAUZE 4X4 16PLY ~~LOC~~+RFID DBL (SPONGE) ×2 IMPLANT
GAUZE SPONGE 4X4 12PLY STRL (GAUZE/BANDAGES/DRESSINGS) ×2 IMPLANT
GAUZE XEROFORM 1X8 LF (GAUZE/BANDAGES/DRESSINGS) ×2 IMPLANT
GLOVE SURG ENC MOIS LTX SZ8 (GLOVE) ×4 IMPLANT
GLOVE SURG UNDER LTX SZ8 (GLOVE) ×2 IMPLANT
GOWN STRL REUS W/ TWL LRG LVL3 (GOWN DISPOSABLE) ×1 IMPLANT
GOWN STRL REUS W/ TWL XL LVL3 (GOWN DISPOSABLE) ×1 IMPLANT
GOWN STRL REUS W/TWL LRG LVL3 (GOWN DISPOSABLE) ×2
GOWN STRL REUS W/TWL XL LVL3 (GOWN DISPOSABLE) ×2
KIT TURNOVER KIT A (KITS) ×2 IMPLANT
MANIFOLD NEPTUNE II (INSTRUMENTS) ×2 IMPLANT
NS IRRIG 1000ML POUR BTL (IV SOLUTION) ×2 IMPLANT
NS IRRIG 500ML POUR BTL (IV SOLUTION) ×2 IMPLANT
PACK EXTREMITY ARMC (MISCELLANEOUS) ×2 IMPLANT
PAD PREP 24X41 OB/GYN DISP (PERSONAL CARE ITEMS) ×2 IMPLANT
SPLINT CAST 1 STEP 3X12 (MISCELLANEOUS) ×2 IMPLANT
SPONGE GAUZE 2X2 8PLY STRL LF (GAUZE/BANDAGES/DRESSINGS) ×2 IMPLANT
STOCKINETTE IMPERVIOUS 9X36 MD (GAUZE/BANDAGES/DRESSINGS) ×2 IMPLANT
SUT PROLENE 4 0 PS 2 18 (SUTURE) ×4 IMPLANT
SUT VIC AB 3-0 SH 27 (SUTURE) ×2
SUT VIC AB 3-0 SH 27X BRD (SUTURE) ×1 IMPLANT
WATER STERILE IRR 500ML POUR (IV SOLUTION) ×2 IMPLANT

## 2021-01-10 NOTE — Anesthesia Preprocedure Evaluation (Addendum)
Anesthesia Evaluation  Patient identified by MRN, date of birth, ID band Patient awake    Reviewed: Allergy & Precautions, NPO status , Patient's Chart, lab work & pertinent test results  History of Anesthesia Complications Negative for: history of anesthetic complications  Airway Mallampati: II  TM Distance: >3 FB Neck ROM: Full    Dental  (+) Upper Dentures, Lower Dentures   Pulmonary shortness of breath and at rest, neg sleep apnea, COPD,  COPD inhaler and oxygen dependent, Patient abstained from smoking.Not current smoker, former smoker,  5L/min O2 at night Severe coPD    + decreased breath sounds(-) wheezing      Cardiovascular Exercise Tolerance: Poor METS(-) hypertension(-) CAD and (-) Past MI (-) dysrhythmias  Rhythm:Regular Rate:Normal - Systolic murmurs    Neuro/Psych negative neurological ROS  negative psych ROS   GI/Hepatic GERD  Medicated,(+)     (-) substance abuse  ,   Endo/Other  neg diabetes  Renal/GU negative Renal ROS     Musculoskeletal   Abdominal   Peds  Hematology   Anesthesia Other Findings Past Medical History: No date: Asthma No date: COPD (chronic obstructive pulmonary disease) (HCC) No date: Dyspnea No date: GERD (gastroesophageal reflux disease) No date: Orthopnea No date: Pneumonia No date: Wheezing  Reproductive/Obstetrics                            Anesthesia Physical Anesthesia Plan  ASA: 3  Anesthesia Plan: MAC   Post-op Pain Management:  Regional for Post-op pain   Induction: Intravenous  PONV Risk Score and Plan: 2 and Ondansetron, Treatment may vary due to age or medical condition and Midazolam  Airway Management Planned: Natural Airway  Additional Equipment: None  Intra-op Plan:   Post-operative Plan:   Informed Consent: I have reviewed the patients History and Physical, chart, labs and discussed the procedure including the risks,  benefits and alternatives for the proposed anesthesia with the patient or authorized representative who has indicated his/her understanding and acceptance.     Dental advisory given  Plan Discussed with: CRNA and Surgeon  Anesthesia Plan Comments: (Patient has concerns given her severe COPD. She prefers regional anesthesia with nerve block as opposed to GETA and I agree with this. Discussed risks of anesthesia with patient, including possibility of difficulty with spontaneous ventilation under anesthesia necessitating airway intervention, PONV, and rare risks such as cardiac or respiratory or neurological events, and allergic reactions. Discussed r/b/a of axillary nerve block, including:  - bleeding, infection, nerve damage - poor or non functioning block. Patient understands. )        Anesthesia Quick Evaluation

## 2021-01-10 NOTE — Op Note (Signed)
01/10/2021  12:10 PM  Patient:   Kristi Walter  Pre-Op Diagnosis:   Dupuytren's contractures, left ring and little fingers.  Post-Op Diagnosis:   Same.  Procedure:   Release of Dupuytren's contracture, left ring and little fingers.  Surgeon:   Maryagnes Amos, MD  Assistant:   Frederic Jericho, PA-S  Anesthesia:   IV sedation with axillary nerve block by anesthesiologist  Findings:   As above.  Complications:   None  EBL:   0 cc  Fluids:   600 cc crystalloid  TT:   90 minutes at 250 mmHg  Drains:   None  Closure:   4-0 Prolene interrupted sutures  Brief Clinical Note:   The patient is a 60 year old female with a long history of progressively worsening contractures of the left ring and little fingers. Her symptoms have progressed despite medications, activity modification, etc. The patient's history and examination are consistent with a Dupuytren's contracture of the left little more so than the ring fingers. The patient presents at this time for release of the Dupuytren's contractures of the left ring and little fingers.  Procedure:   The patient underwent placement of an axillary nerve block in the preoperative holding area by the anesthesiologist before being brought into the operating room and lain in the supine position. After adequate IV sedation was achieved, the left hand and upper extremity were prepped with ChloraPrep solution before being draped sterilely. Preoperative antibiotics were administered. A timeout was performed to verify the appropriate surgical site before the limb was exsanguinated with an Esmarch and the tourniquet inflated to 250 mmHg.    A Brunner type zigzag incision was made along the volar aspect of the left little finger beginning just proximal to the proximal palmar crease and extending to the little DIP flexion crease. The incision was carried down through subcutaneous tissues. The fibrous cord was identified and carefully dissected out from proximal to  distal after releasing it proximally. As dissection was carried out, care was taken to identify and protect the common digital nerve and artery on either side of the cord, as well as the underlying flexor tendon, proximally. More distally, the digital neurovascular bundles were identified and protected the contracted cord was noted to wrap nearly completely around the ulnar neurovascular bundle of the little finger.   The cord was noted to extend to the MCP region of the ring finger, so this area was debrided out as well. After the mass was removed in its entirety, the adequacy of excision was verified by palpation as well as visually. After excision of the Dupuytren's tissue, the left little and ring finger MCP joints could be extended fully. The PIP joints of each finger still lacked about 20 degrees of extension so they were gently manipulated to achieve full extension.  The wound was copiously irrigated with sterile saline solution before the skin was reapproximated using 4-0 Prolene interrupted sutures. A total of 20 cc of 0.25% plain Sensorcaine was injected in and around the incision to help with postoperative analgesia before a sterile bulky dressing and volar splint extending to the fingertips was applied, maintaining the MCP joints in extension. Circulation to the ring and little fingers was somewhat sluggish, but the fingers did pink up appropriately. The patient was then awakened and returned to the recovery room in satisfactory condition after tolerating the procedure well.

## 2021-01-10 NOTE — H&P (Signed)
History of Present Illness:  Kristi Walter is a 60 y.o. female who is being referred by Dr. Landry Mellow for evaluation and treatment of a contracture of the left little finger MCP joint. The patient notes that the contracture has been present for 6 months or so but appears to be getting worse over the past several months. She notes only a dull achy discomfort in the finger but finds that it is becoming increasingly difficult to hold or grasp objects. She has tried to do some exercises to stretch her fingers out without success. She also notes some sensitivity to touch over the contracture as well as some numbness/tingling to the ring and little fingers. She denies any specific injury to the hand. She has been taking ibuprofen and/or Tylenol with limited benefit. She is quite frustrated by the symptoms and is ready to consider more aggressive treatment options.  Current Outpatient Medications:  albuterol 90 mcg/actuation inhaler Note:q2-3hrs * PT SHOULDN'T EXCEED MORE THAN 12 PUFFS IN 24HRS Dose: 1-2 PUFFS   budesonide-formoterol (SYMBICORT) 160-4.5 mcg/actuation inhaler Note:2 inhalations twice daily. Please provide spacer. Dose: 160-4.   codeine-guaifenesin 10-100 mg/5 mL oral liquid Take 5 mLs by mouth every 4 (four) hours as needed. 120 mL 0   EPINEPHrine (EPIPEN) 0.3 mg/0.3 mL pen injector Inject 0.3 mg into the muscle.   fluticasone (FLONASE) 50 mcg/actuation nasal spray 2 sprays by Each Nare route daily. Frequency:QD Dosage:50 MCG Instructions: Note:Dose:   guaiFENesin (MUCINEX) 100 mg/5 mL solution Take by mouth.   ipratropium-albuterol (DUO-NEB) nebulizer solution Inhale 3 mL by nebulization two (2) times a day as needed.   loratadine (CLARITIN) 10 mg tablet Take 10 mg by mouth.   montelukast (SINGULAIR) 10 mg tablet Take 10 mg by mouth.   omeprazole (PRILOSEC) 20 MG DR capsule Take 20 mg by mouth.   theophylline (THEO-24) 100 MG 24 hr capsule Take 100 mg by mouth.   tiotropium  (SPIRIVA) 18 mcg inhalation capsule Inhale the content of 1 capsule with Handihaler daily.   varenicline (CHANTIX) 1 mg tablet 0.5 mg for 3 days once daily, then 0.5 mg twice daily for 3 days, then 1 mg twice daily   Allergies:   Egg Anaphylaxis   Peanut Anaphylaxis   Tomato Anaphylaxis   Venom-Honey Bee Anaphylaxis   Wasp Venom Anaphylaxis   Past Medical History:   COPD (chronic obstructive pulmonary disease) (CMS-HCC)   GERD (gastroesophageal reflux disease)   Past Surgical History:         No past surgical history on file.   Past Family History: No family history on file.   Social History:   Socioeconomic History:   Marital status: Widowed  Tobacco Use   Smoking status: Former Smoker   Smokeless tobacco: Never Used   Tobacco comment: none in 1 week  Substance and Sexual Activity   Alcohol use: No  Alcohol/week: 0.0 standard drinks   Drug use: No   Sexual activity: Defer   Review of Systems:  A comprehensive 14 point ROS was performed, reviewed, and the pertinent orthopaedic findings are documented in the HPI.  Physical Exam: Vitals:  12/26/20 1032  BP: 126/72  Weight: 73.4 kg (161 lb 12.8 oz)  Height: 160 cm (5\' 3" )  PainSc: 3  PainLoc: Hand   General/Constitutional: The patient appears to be well-nourished, well-developed, and in no acute distress. Neuro/Psych: Normal mood and affect, oriented to person, place and time. Eyes: Non-icteric. Pupils are equal, round, and reactive to light, and exhibit synchronous  movement. ENT: Unremarkable. Lymphatic: No palpable adenopathy. Respiratory: Lungs clear to auscultation, Normal chest excursion, No wheezes and Non-labored breathing. Decreased breath sounds due to her underlying COPD. Cardiovascular: Regular rate and rhythm. No murmurs. and No edema, swelling or tenderness, except as noted in detailed exam. Integumentary: No impressive skin lesions present, except as noted in detailed exam. Musculoskeletal:  Unremarkable, except as noted in detailed exam.  Left hand exam: Skin inspection of the left hand is notable for a raised firm cordlike structure causing a contracture of the little MCP joint, but otherwise is unremarkable. No swelling, erythema, ecchymosis, abrasions, or other skin abnormalities are identified. She has minimal tenderness to palpation over the contracted cord, but there are no other areas of tenderness around the hand. The contracture measures approximately 65 degrees. The cord extends from just proximal to the proximal palmar crease distally to the PIP flexion crease. The PIP joint lacks approximate 10 degrees of extension. The ring finger also appears to be somewhat flexed at the MCP and PIP joints, although no discrete cord or nodule is palpable along the ring flexor tendon. She is able active flex and extend all digits without any pain or triggering. She is neurovascular intact to all digits.  Assessment:  Dupuytren's contracture of left hand.   Plan: The treatment options were discussed with the patient. In addition, patient educational materials were provided regarding the diagnosis and treatment options. The patient is quite frustrated by her symptoms and functional limitations, and is ready to consider more aggressive treatment options. Therefore, I have recommended a surgical procedure, specifically an excision of the Dupuytren's contracture involving the left little finger as well as possible treatment of an early Dupuytren's cord involving the adjacent ring finger. The procedure was discussed with the patient, as were the potential risks (including bleeding, infection, nerve and/or blood vessel injury, persistent or recurrent pain/contracture, weakness of grip, stiffness of the finger(s), need for further surgery, blood clots, strokes, heart attacks and/or arhythmias, pneumonia, etc.) and benefits. The patient states her understanding and wishes to proceed. All of the patient's  questions and concerns were answered. She can call any time with further concerns. She will follow up post-surgery, routine.   H&P reviewed and patient re-examined. No changes.

## 2021-01-10 NOTE — Discharge Instructions (Addendum)
Orthopedic discharge instructions: Keep splint dry and intact. Keep hand elevated above heart level. Apply ice to affected area frequently. Take ibuprofen 600 mg TID with meals for 7-10 days, then as necessary. Take pain medication as prescribed or ES Tylenol when needed.  Return for follow-up in 3-6 days for dressing change.   AMBULATORY SURGERY  DISCHARGE INSTRUCTIONS   The drugs that you were given will stay in your system until tomorrow so for the next 24 hours you should not:  Drive an automobile Make any legal decisions Drink any alcoholic beverage   You may resume regular meals tomorrow.  Today it is better to start with liquids and gradually work up to solid foods.  You may eat anything you prefer, but it is better to start with liquids, then soup and crackers, and gradually work up to solid foods.   Please notify your doctor immediately if you have any unusual bleeding, trouble breathing, redness and pain at the surgery site, drainage, fever, or pain not relieved by medication.    Your post-operative visit with Dr.                                       is: Date:                        Time:    Please call to schedule your post-operative visit.  Additional Instructions:

## 2021-01-10 NOTE — Anesthesia Procedure Notes (Signed)
Anesthesia Regional Block: Axillary brachial plexus block   Pre-Anesthetic Checklist: , timeout performed,  Correct Patient, Correct Site, Correct Laterality,  Correct Procedure, Correct Position, site marked,  Risks and benefits discussed,  Surgical consent,  Pre-op evaluation,  At surgeon's request and post-op pain management  Laterality: Left  Prep: chloraprep       Needles:  Injection technique: Single-shot  Needle Type: Echogenic Needle     Needle Length: 4cm  Needle Gauge: 25     Additional Needles:   Narrative:  Start time: 01/10/2021 9:20 AM End time: 01/10/2021 9:25 AM Injection made incrementally with aspirations every 5 mL.  Performed by: Personally  Anesthesiologist: Corinda Gubler, MD  Additional Notes: Patient's chart reviewed and they were deemed appropriate candidate for procedure, at surgeon's request. Patient educated about risks, benefits, and alternatives of the block including but not limited to: temporary or permanent nerve damage, bleeding, infection, damage to surround tissues, block failure, local anesthetic toxicity. Patient expressed understanding. A formal time-out was conducted consistent with institution rules.  Monitors were applied, and minimal sedation used (see nursing record). The site was prepped with skin prep and allowed to dry, and sterile gloves were used. A high frequency linear ultrasound probe with probe cover was utilized throughout. Axillary artery identified as well as individual nerves around it. Injected around the artery, with 66ml of below solution injected around musculocutaneous nerve. Aspiration performed every 42ml. blood vessels were avoided. All injections were performed without resistance and free of blood and paresthesias. The patient tolerated the procedure well.  Injectate: 68ml 0.5% ropivicaine.

## 2021-01-10 NOTE — Progress Notes (Signed)
Patient with redness in both  eyes. Right> than left.  Dr. Suzan Slick in. Artificial tears ordered.  States from "allergies"

## 2021-01-10 NOTE — Anesthesia Postprocedure Evaluation (Signed)
Anesthesia Post Note  Patient: Kristi Walter  Procedure(s) Performed: DUPUYTREN CONTRACTURE RELEASE LEFT RING AND LEFT LITTLE FINGER (Left: Finger)  Patient location during evaluation: PACU Anesthesia Type: MAC Level of consciousness: awake and alert Pain management: pain level controlled Vital Signs Assessment: post-procedure vital signs reviewed and stable Respiratory status: spontaneous breathing, nonlabored ventilation, respiratory function stable and patient connected to nasal cannula oxygen Cardiovascular status: stable and blood pressure returned to baseline Postop Assessment: no apparent nausea or vomiting Anesthetic complications: no   No notable events documented.   Last Vitals:  Vitals:   01/10/21 1226 01/10/21 1235  BP: (!) 98/50 122/62  Pulse: 74 64  Resp: (!) 21 18  Temp: 36.7 C   SpO2: 95% 93%    Last Pain:  Vitals:   01/10/21 1235  TempSrc:   PainSc: 3                  Arita Miss

## 2021-01-10 NOTE — Transfer of Care (Signed)
Immediate Anesthesia Transfer of Care Note  Patient: Kristi Walter  Procedure(s) Performed: DUPUYTREN CONTRACTURE RELEASE LEFT RING AND LEFT LITTLE FINGER (Left: Finger)  Patient Location: PACU  Anesthesia Type:General  Level of Consciousness: awake, drowsy and patient cooperative  Airway & Oxygen Therapy: Patient Spontanous Breathing and Patient connected to face mask oxygen  Post-op Assessment: Report given to RN and Post -op Vital signs reviewed and stable  Post vital signs: Reviewed and stable  Last Vitals:  Vitals Value Taken Time  BP 101/59 01/10/21 1200  Temp 36.7 C 01/10/21 1200  Pulse 74 01/10/21 1206  Resp 18 01/10/21 1206  SpO2 100 % 01/10/21 1206  Vitals shown include unvalidated device data.  Last Pain:  Vitals:   01/10/21 1200  TempSrc:   PainSc: 7          Complications: No notable events documented.

## 2021-01-11 LAB — SURGICAL PATHOLOGY

## 2021-01-31 ENCOUNTER — Ambulatory Visit: Payer: Managed Care, Other (non HMO) | Admitting: Internal Medicine

## 2021-02-07 ENCOUNTER — Encounter: Payer: Self-pay | Admitting: Occupational Therapy

## 2021-02-07 ENCOUNTER — Ambulatory Visit: Payer: Managed Care, Other (non HMO) | Attending: Student | Admitting: Occupational Therapy

## 2021-02-07 DIAGNOSIS — M720 Palmar fascial fibromatosis [Dupuytren]: Secondary | ICD-10-CM | POA: Insufficient documentation

## 2021-02-07 DIAGNOSIS — L905 Scar conditions and fibrosis of skin: Secondary | ICD-10-CM | POA: Diagnosis present

## 2021-02-07 DIAGNOSIS — M6281 Muscle weakness (generalized): Secondary | ICD-10-CM | POA: Insufficient documentation

## 2021-02-07 DIAGNOSIS — M25642 Stiffness of left hand, not elsewhere classified: Secondary | ICD-10-CM | POA: Diagnosis present

## 2021-02-07 DIAGNOSIS — M79642 Pain in left hand: Secondary | ICD-10-CM | POA: Insufficient documentation

## 2021-02-07 NOTE — Therapy (Signed)
[facility] [address], [place], [address] Phone: [phone]   Fax:  [phone]  Occupational Therapy Evaluation  Patient Details  Name: Kristi Walter MRN: [id number] Date of Birth: 01-05-1961 Referring Provider (OT): Marney Doctor   Encounter Date: 02/07/2021   OT End of Session - 02/07/21 1801     Visit Number 1    Number of Visits 12    Date for OT Re-Evaluation 04/04/21    OT Start Time 1447    OT Stop Time 1529    OT Time Calculation (min) 42 min    Activity Tolerance Patient tolerated treatment well    Behavior During Therapy [facility] for tasks assessed/performed             Past Medical History:  Diagnosis Date   Asthma    COPD (chronic obstructive pulmonary disease) (HCC)    Dyspnea    GERD (gastroesophageal reflux disease)    Orthopnea    Pneumonia    Wheezing     Past Surgical History:  Procedure Laterality Date   ABDOMINAL HYSTERECTOMY  1989   Cardiac laser ablation  2015   [facility] [place]   CATARACT EXTRACTION [facility] Left 06/04/2017   Procedure: CATARACT EXTRACTION PHACO AND INTRAOCULAR LENS PLACEMENT (IOC);  Surgeon: Galen Manila, MD;  Location: [facility] ORS;  Service: Ophthalmology;  Laterality: Left;  [place] 00:58.5 AP% 22.3 CDE 13.03 Fluid Pack Lot # [id number]   CATARACT EXTRACTION W/PHACO Right 2020   CESAREAN SECTION     x2   DUPUYTREN CONTRACTURE RELEASE Left 01/10/2021   Procedure: DUPUYTREN CONTRACTURE RELEASE LEFT RING AND LEFT LITTLE FINGER;  Surgeon: Christena Flake, MD;  Location: [facility] ORS;  Service: Orthopedics;  Laterality: Left;   FOOT SURGERY Left    tendon laceration repair   pyloric     stenosis surgery    There were no vitals filed for this visit.   Subjective Assessment - 02/07/21 1754     Subjective  My hand is only swollen in the am but by 10 am it is better- my hands is just very tender, tight and stiff - and only pain when trying to work it    Pertinent  History Pt had on 01/10/21 L hand Dupuytrens release by Dr Joice Lofts - pt stiches were removed on 10-12/22 and refer to OT    Patient Stated Goals Want to be able grip with my hand , get the stiffness, tightness better- and open my fingers to put on gloves, hand in my pocket and wash my face    Currently in Pain? Yes    Pain Score 3     Pain Location Hand    Pain Orientation Left    Pain Descriptors / Indicators Tender;Tightness;Throbbing;Numbness    Pain Type Surgical pain    Pain Onset More than a month ago    Pain Frequency Intermittent               [facility] OT Assessment - 02/07/21 0001       Assessment   Medical Diagnosis L hand Dupytrens release    Referring Provider (OT) Marney Doctor    Onset Date/Surgical Date 01/10/21    Hand Dominance Right    Next MD Visit middle Nov      Home  Environment   Lives With Family      Prior Function   Vocation Full time employment    Leisure Work on computer, house work and watch  tv      Left Hand AROM   L Thumb Opposition to Index --   Opposition to side of 5th   L Index  MCP 0-90 80 Degrees    L Index PIP 0-100 100 Degrees    L Long  MCP 0-90 80 Degrees    L Long PIP 0-100 100 Degrees    L Ring  MCP 0-90 75 Degrees   -25   L Ring PIP 0-100 100 Degrees   -10   L Little  MCP 0-90 75 Degrees   0   L Little PIP 0-100 75 Degrees   -40 ext             moist heat - still 2 tiny scabs on  LMB PIP extention splint 2 min on 5th - pt to use during heat but no pain -slight stretch 2 min at time  Rolling over foam roller 20 reps - palmar scar massage And manaul scar massage -and  silicon digisleeve on 5th for scar  Cica scar pad night time  PROM on table for PIP extention  Tendon glides AROM blocking - focus on extention every time  3 x day   Done mini massager on palmar scar and 5th digit- great success as well as manual massage  Used LMB splint in heat and pt progress 10 degrees in extention at 5th PIP                 OT  Education - 02/07/21 1800     Education Details Findings of eval and HEP    Person(s) Educated Patient    Methods Explanation;Demonstration;Tactile cues;Verbal cues;Handout    Comprehension Verbal cues required;Returned demonstration;Verbalized understanding              OT Short Term Goals - 02/07/21 1808       OT SHORT TERM GOAL #1   Title Pt to be independent in HEP to decrease scar tissue to show increase extention and flexoin of digits and able to tolerate different textures and gripping objects without increase symptoms    Baseline tender for scar massage - cannot tolerate towel drying hand or objects gripping in palm- pain or sensitivity increase to 3/10    Time 3    Period Weeks    Status New    Target Date 02/28/21               OT Long Term Goals - 02/07/21 1810       OT LONG TERM GOAL #1   Title L hand digits extnetion increaase to WNL to donn glove, reach hand in pocket and wash face or body    Baseline 5th PIP -40 , 4th MC -25 and PIP -10    Time 5    Period Weeks    Status New    Target Date 03/14/21      OT LONG TERM GOAL #2   Title L hand digits improve for pt to touch palm without increase symtoms to squeeze washcloth , hold utencil  and steeringwheel    Baseline flexion decrease in MC's 70-80 and 5th PIP 75 - tender or sensitive in palm with different textures    Time 6    Period Weeks    Status New    Target Date 03/21/21      OT LONG TERM GOAL #3   Title L hand grip and prehension strength increase to more than 60% compare to R hand to carry groceries,  push and pull doors , turn doorknob    Baseline NT- pt 4 wks s/p    Time 8    Period Weeks    Status New    Target Date 04/04/21                   Plan - 02/07/21 1803     Clinical Impression Statement Pt present at OT eval diagnosis of s/p L hand dupuytrens release hand and 5th digit into volar PIP and middle phalange- pt is 4 wks s/p. Pt with thick and sensitive scar - tender  and not tolerate massage, drying with towel or gripping different objects. Pt with decrease  extention at 4th [facility] and 5th PIP by -40 - will monitor if need extention splint for night time -decrease  flexion in 5th more thand 4th digit and numbness in 5th digit per pt and decrease strength - limiting her functional use of L hand in ADL's and IADL's - pt can benefit from OT services to adress impairements to return to prior level of function    OT Occupational Profile and History Problem Focused Assessment - Including review of records relating to presenting problem    Occupational performance deficits (Please refer to evaluation for details): ADL's;IADL's;Work;Play;Leisure;Social Participation    Body Structure / Function / Physical Skills ADL;Coordination;FMC;Flexibility;Dexterity;Strength;Pain;UE functional use;ROM;IADL;Scar mobility;Sensation    Rehab Potential Good    Clinical Decision Making Limited treatment options, no task modification necessary    Comorbidities Affecting Occupational Performance: None    Modification or Assistance to Complete Evaluation  No modification of tasks or assist necessary to complete eval    OT Frequency 2x / week   2 x wk and then decreae to 1 x wk   OT Duration 8 weeks    OT Treatment/Interventions Self-care/ADL training;Moist Heat;Fluidtherapy;Splinting;Contrast Bath;Therapeutic exercise;Ultrasound;Scar mobilization;Passive range of motion;Paraffin;Manual Therapy;Patient/family education    Consulted and Agree with Plan of Care Patient             Patient will benefit from skilled therapeutic intervention in order to improve the following deficits and impairments:   Body Structure / Function / Physical Skills: ADL, Coordination, FMC, Flexibility, Dexterity, Strength, Pain, UE functional use, ROM, IADL, Scar mobility, Sensation       Visit Diagnosis: Dupuytren's contracture of left hand - Plan: Ot plan of care cert/re-cert  Scar condition and fibrosis  of skin - Plan: Ot plan of care cert/re-cert  Stiffness of left hand, not elsewhere classified - Plan: Ot plan of care cert/re-cert  Pain in left hand - Plan: Ot plan of care cert/re-cert  Muscle weakness (generalized) - Plan: Ot plan of care cert/re-cert    Problem List Patient Active Problem List   Diagnosis Date Noted   Acute respiratory failure with hypoxemia (HCC) 05/24/2016   SVT (supraventricular tachycardia) (HCC) 04/27/2014   Gastro-esophageal reflux disease without esophagitis 04/26/2014   COPD with asthma (HCC) 04/26/2014   Low back sprain 09/15/2013   Current tobacco use 03/06/2013   Allergic rhinitis 07/13/2010   Allergic to food 07/13/2010   Chronic obstructive pulmonary disease (HCC) 08/14/2000    Oletta Cohn, OTR/L<CLT 02/07/2021, 6:14 PM  [place] [facility] [address], [place], [address] Phone: [phone]   Fax:  [phone]  Name: Kristi Walter MRN: [id number] Date of Birth: December 05, 1960

## 2021-02-13 ENCOUNTER — Ambulatory Visit: Payer: Managed Care, Other (non HMO) | Admitting: Occupational Therapy

## 2021-02-13 DIAGNOSIS — M720 Palmar fascial fibromatosis [Dupuytren]: Secondary | ICD-10-CM | POA: Diagnosis not present

## 2021-02-13 DIAGNOSIS — L905 Scar conditions and fibrosis of skin: Secondary | ICD-10-CM

## 2021-02-13 DIAGNOSIS — M79642 Pain in left hand: Secondary | ICD-10-CM

## 2021-02-13 DIAGNOSIS — M6281 Muscle weakness (generalized): Secondary | ICD-10-CM

## 2021-02-13 DIAGNOSIS — M25642 Stiffness of left hand, not elsewhere classified: Secondary | ICD-10-CM

## 2021-02-13 NOTE — Therapy (Signed)
[employer] [facility] [address], [place], [address] Phone: [phone]   Fax:  [phone]  Occupational Therapy Treatment  Patient Details  Name: Kristi Walter MRN: [id number] Date of Birth: 1960-10-25 Referring Provider (OT): Marney Doctor   Encounter Date: 02/13/2021   OT End of Session - 02/13/21 1532     Visit Number 2    Number of Visits 12    Date for OT Re-Evaluation 04/04/21    OT Start Time 1515    OT Stop Time 1554    OT Time Calculation (min) 39 min    Activity Tolerance Patient tolerated treatment well    Behavior During Therapy [facility] for tasks assessed/performed             Past Medical History:  Diagnosis Date   Asthma    COPD (chronic obstructive pulmonary disease) (HCC)    Dyspnea    GERD (gastroesophageal reflux disease)    Orthopnea    Pneumonia    Wheezing     Past Surgical History:  Procedure Laterality Date   ABDOMINAL HYSTERECTOMY  1989   Cardiac laser ablation  2015   [facility]   CATARACT EXTRACTION [facility] Left 06/04/2017   Procedure: CATARACT EXTRACTION PHACO AND INTRAOCULAR LENS PLACEMENT (IOC);  Surgeon: Galen Manila, MD;  Location: [facility] ORS;  Service: Ophthalmology;  Laterality: Left;  [place] 00:58.5 AP% 22.3 CDE 13.03 Fluid Pack Lot # [id number]   CATARACT EXTRACTION W/PHACO Right 2020   CESAREAN SECTION     x2   DUPUYTREN CONTRACTURE RELEASE Left 01/10/2021   Procedure: DUPUYTREN CONTRACTURE RELEASE LEFT RING AND LEFT LITTLE FINGER;  Surgeon: Christena Flake, MD;  Location: [facility] ORS;  Service: Orthopedics;  Laterality: Left;   FOOT SURGERY Left    tendon laceration repair   pyloric     stenosis surgery    There were no vitals filed for this visit.   Subjective Assessment - 02/13/21 1520     Subjective  Done my exercises and not as sensitivite -and more straight - using it more than last week - and getting stronger    Pertinent History Pt had on 01/10/21 L hand  Dupuytrens release by Dr Joice Lofts - pt stiches were removed on 10-12/22 and refer to OT    Patient Stated Goals Want to be able grip with my hand , get the stiffness, tightness better- and open my fingers to put on gloves, hand in my pocket and wash my face    Currently in Pain? No/denies   more stiffness               [facility] OT Assessment - 02/13/21 0001       Left Hand AROM   L Ring  MCP 0-90 85 Degrees   -20   L Ring PIP 0-100 100 Degrees   -5   L Little  MCP 0-90 80 Degrees   0   L Little PIP 0-100 85 Degrees   -35 ext, in session -25                     OT Treatments/Exercises (OP) - 02/13/21 0001       LUE Paraffin   Number Minutes Paraffin 8 Minutes    LUE Paraffin Location Hand    Comments LMB splint on 5th for PIP extention            Pt to cont with LMB PIP extention splint 2  min on 5th - pt to use during heat but no pain -slight stretch 2 min at time  Rolling over foam roller 20 reps - palmar scar massage And manaul scar massage done by OT  -and  silicon digisleeve on 5th for scar provided new one for day time use  Cica scar pad night time  PROM on table for PIP extention  Tendon glides AROM blocking - focus on extention every time  3 x day    Done mini massager on palmar scar and 5th digit- great success as well as manual massage Fabricated finger base extention gutter splint for 5th to use at night time - with coban wrap - pt report sleeping in fist with hand  Used LMB splint in paraffin this date  and pt progress 10 degrees in extention at 5th PIP                   OT Education - 02/13/21 1532     Education Details progress and changes to HEP    Person(s) Educated Patient    Methods Explanation;Demonstration;Tactile cues;Verbal cues;Handout    Comprehension Verbal cues required;Returned demonstration;Verbalized understanding              OT Short Term Goals - 02/07/21 1808       OT SHORT TERM GOAL #1   Title Pt to be  independent in HEP to decrease scar tissue to show increase extention and flexoin of digits and able to tolerate different textures and gripping objects without increase symptoms    Baseline tender for scar massage - cannot tolerate towel drying hand or objects gripping in palm- pain or sensitivity increase to 3/10    Time 3    Period Weeks    Status New    Target Date 02/28/21               OT Long Term Goals - 02/07/21 1810       OT LONG TERM GOAL #1   Title L hand digits extnetion increaase to WNL to donn glove, reach hand in pocket and wash face or body    Baseline 5th PIP -40 , 4th MC -25 and PIP -10    Time 5    Period Weeks    Status New    Target Date 03/14/21      OT LONG TERM GOAL #2   Title L hand digits improve for pt to touch palm without increase symtoms to squeeze washcloth , hold utencil  and steeringwheel    Baseline flexion decrease in MC's 70-80 and 5th PIP 75 - tender or sensitive in palm with different textures    Time 6    Period Weeks    Status New    Target Date 03/21/21      OT LONG TERM GOAL #3   Title L hand grip and prehension strength increase to more than 60% compare to R hand to carry groceries, push and pull doors , turn doorknob    Baseline NT- pt 4 wks s/p    Time 8    Period Weeks    Status New    Target Date 04/04/21                   Plan - 02/13/21 1533     Clinical Impression Statement Pt present at OT eval diagnosis of s/p L hand dupuytrens release hand and 5th digit into volar PIP and middle phalange- pt is 5 wks s/p.  Pt showed great progress in scar sensitivity and scar tenderness. Tolerated massage, textures much better and report using hand and feeling more strength.  Improvement in flexion and extention of 4th and 5th digits -did fabricate this date extention gutter splint for night time for PIP flexion contracture for nigh time - - pt to cont with scar management, stretches , ROM  - cont to show stiffness , scar  tissue in 4th and 5th  digits and numbness in 5th digit per pt- limiting her functional use of L hand in ADL's and IADL's - pt can benefit from OT services to adress impairements to return to prior level of function    OT Occupational Profile and History --    Occupational performance deficits (Please refer to evaluation for details): ADL's;IADL's;Work;Play;Leisure;Social Participation    Body Structure / Function / Physical Skills ADL;Coordination;FMC;Flexibility;Dexterity;Strength;Pain;UE functional use;ROM;IADL;Scar mobility;Sensation    Rehab Potential Good    Clinical Decision Making Limited treatment options, no task modification necessary    Comorbidities Affecting Occupational Performance: None    Modification or Assistance to Complete Evaluation  No modification of tasks or assist necessary to complete eval    OT Frequency 2x / week    OT Duration 8 weeks    OT Treatment/Interventions Self-care/ADL training;Moist Heat;Fluidtherapy;Splinting;Contrast Bath;Therapeutic exercise;Ultrasound;Scar mobilization;Passive range of motion;Paraffin;Manual Therapy;Patient/family education    Consulted and Agree with Plan of Care Patient             Patient will benefit from skilled therapeutic intervention in order to improve the following deficits and impairments:   Body Structure / Function / Physical Skills: ADL, Coordination, FMC, Flexibility, Dexterity, Strength, Pain, UE functional use, ROM, IADL, Scar mobility, Sensation       Visit Diagnosis: Dupuytren's contracture of left hand  Scar condition and fibrosis of skin  Stiffness of left hand, not elsewhere classified  Pain in left hand  Muscle weakness (generalized)    Problem List Patient Active Problem List   Diagnosis Date Noted   Acute respiratory failure with hypoxemia (HCC) 05/24/2016   SVT (supraventricular tachycardia) (HCC) 04/27/2014   Gastro-esophageal reflux disease without esophagitis 04/26/2014   COPD with  asthma (HCC) 04/26/2014   Low back sprain 09/15/2013   Current tobacco use 03/06/2013   Allergic rhinitis 07/13/2010   Allergic to food 07/13/2010   Chronic obstructive pulmonary disease (HCC) 08/14/2000    Oletta Cohn, OTR/L,CLT 02/13/2021, 4:02 PM  [facility] [address] [place], [address] Phone: [phone]   Fax:  [phone]  Name: Kristi Walter MRN: [id number] Date of Birth: [birth year]

## 2021-02-16 ENCOUNTER — Ambulatory Visit: Payer: Managed Care, Other (non HMO) | Attending: Student | Admitting: Occupational Therapy

## 2021-02-16 DIAGNOSIS — L905 Scar conditions and fibrosis of skin: Secondary | ICD-10-CM

## 2021-02-16 DIAGNOSIS — M79642 Pain in left hand: Secondary | ICD-10-CM

## 2021-02-16 DIAGNOSIS — M6281 Muscle weakness (generalized): Secondary | ICD-10-CM

## 2021-02-16 DIAGNOSIS — M720 Palmar fascial fibromatosis [Dupuytren]: Secondary | ICD-10-CM

## 2021-02-16 DIAGNOSIS — M25642 Stiffness of left hand, not elsewhere classified: Secondary | ICD-10-CM | POA: Diagnosis present

## 2021-02-16 NOTE — Therapy (Signed)
[employer] [facility] [address] [place], [address] Phone: [phone]   Fax:  [phone]  Occupational Therapy Treatment  Patient Details  Name: Kristi Walter MRN: [id number] Date of Birth: 11-22-1960 Referring Provider (OT): Marney Doctor   Encounter Date: 02/16/2021   OT End of Session - 02/16/21 1029     Visit Number 3    Number of Visits 12    Date for OT Re-Evaluation 04/04/21    OT Start Time 1029    OT Stop Time 1057    OT Time Calculation (min) 28 min    Activity Tolerance Patient tolerated treatment well    Behavior During Therapy [facility] for tasks assessed/performed             Past Medical History:  Diagnosis Date   Asthma    COPD (chronic obstructive pulmonary disease) (HCC)    Dyspnea    GERD (gastroesophageal reflux disease)    Orthopnea    Pneumonia    Wheezing     Past Surgical History:  Procedure Laterality Date   ABDOMINAL HYSTERECTOMY  1989   Cardiac laser ablation  2015   [facility]   CATARACT EXTRACTION [facility] Left 06/04/2017   Procedure: CATARACT EXTRACTION PHACO AND INTRAOCULAR LENS PLACEMENT (IOC);  Surgeon: Galen Manila, MD;  Location: [facility] ORS;  Service: Ophthalmology;  Laterality: Left;  [place] 00:58.5 AP% 22.3 CDE 13.03 Fluid Pack Lot # [id number] H   CATARACT EXTRACTION W/PHACO Right 2020   CESAREAN SECTION     x2   DUPUYTREN CONTRACTURE RELEASE Left 01/10/2021   Procedure: DUPUYTREN CONTRACTURE RELEASE LEFT RING AND LEFT LITTLE FINGER;  Surgeon: Christena Flake, MD;  Location: [facility] ORS;  Service: Orthopedics;  Laterality: Left;   FOOT SURGERY Left    tendon laceration repair   pyloric     stenosis surgery    There were no vitals filed for this visit.   Subjective Assessment - 02/16/21 1028     Subjective  Could not wear the splint you made- pain in the knuckle -has more strength and control in the hand and finger- still trouble with small items    Pertinent History  Pt had on 01/10/21 L hand Dupuytrens release by Dr Joice Lofts - pt stiches were removed on 10-12/22 and refer to OT    Patient Stated Goals Want to be able grip with my hand , get the stiffness, tightness better- and open my fingers to put on gloves, hand in my pocket and wash my face    Currently in Pain? No/denies    Pain Score 4     Pain Location Finger (Comment which one)    Pain Orientation Left    Pain Descriptors / Indicators Aching;Tender    Pain Type Surgical pain    Pain Onset More than a month ago    Pain Frequency Intermittent                [facility] OT Assessment - 02/16/21 0001       Left Hand AROM   L Little  MCP 0-90 85 Degrees    L Little PIP 0-100 85 Degrees               pain and stiffness in 5th - pain in 5th MC with flexion and tender 4/10 - but decrease after paraffin        OT Treatments/Exercises (OP) - 02/16/21 0001       LUE Paraffin  Number Minutes Paraffin 8 Minutes    LUE Paraffin Location Hand    Comments decrease pain             Pt to cont with LMB PIP extention splint 2 min on 5th - pt to use during heat but no pain -slight stretch 2 min at time at home - but hold off on it for 24-48 hrs to decrease pain    Rolling over foam roller 20 reps - palmar scar massage And manaul scar massage done by OT  -and  silicon digisleeve on 5th for scar provided new one for day time use  Cica scar pad night time- new one provided PROM  for PIP extention with MC flexion at 90  And then on table for PIP extention  Tendon glides AROM blocking - focus on extention every time  3 x day    Done mini massager on palmar scar and 5th digit- great success as well as manual massage Discharge  finger base extention gutter splint for 5th to use at night time - with coban wrap - report increase pain and unable to tolerate Will assess  next time if need to do hand base dorsal extention splint to use   increase after paraffin and with PROM - increase of 10  degrees in extention at PIP            OT Education - 02/16/21 1029     Education Details progress and changes to HEP    Person(s) Educated Patient    Methods Explanation;Demonstration;Tactile cues;Verbal cues;Handout    Comprehension Verbal cues required;Returned demonstration;Verbalized understanding              OT Short Term Goals - 02/07/21 1808       OT SHORT TERM GOAL #1   Title Pt to be independent in HEP to decrease scar tissue to show increase extention and flexoin of digits and able to tolerate different textures and gripping objects without increase symptoms    Baseline tender for scar massage - cannot tolerate towel drying hand or objects gripping in palm- pain or sensitivity increase to 3/10    Time 3    Period Weeks    Status New    Target Date 02/28/21               OT Long Term Goals - 02/07/21 1810       OT LONG TERM GOAL #1   Title L hand digits extnetion increaase to WNL to donn glove, reach hand in pocket and wash face or body    Baseline 5th PIP -40 , 4th MC -25 and PIP -10    Time 5    Period Weeks    Status New    Target Date 03/14/21      OT LONG TERM GOAL #2   Title L hand digits improve for pt to touch palm without increase symtoms to squeeze washcloth , hold utencil  and steeringwheel    Baseline flexion decrease in MC's 70-80 and 5th PIP 75 - tender or sensitive in palm with different textures    Time 6    Period Weeks    Status New    Target Date 03/21/21      OT LONG TERM GOAL #3   Title L hand grip and prehension strength increase to more than 60% compare to R hand to carry groceries, push and pull doors , turn doorknob    Baseline NT- pt 4 wks s/p  Time 8    Period Weeks    Status New    Target Date 04/04/21                   Plan - 02/16/21 1058     Clinical Impression Statement Pt present at OT eval diagnosis of s/p L hand dupuytrens release hand and 5th digit into volar PIP and middle phalange- pt is  5 1/2 wks s/p. Pt showed great progress in scar sensitivity and scar tenderness. Tolerated massage, textures much better and report using hand and feeling more strength. But report some increase pain and not able to tolerate PIP extention splint the last 2 nights - had some increase pain and tenderness at 5th [facility] this date but decrease after paraffin- focus this date on scar mobs and pain free AROM/PROM - pt to cont with scar management, stretches pain free and ROM  - Will assess need for dorsal hand base extention night splint - scar , pain and stiffness still  limiting her functional use of L hand in ADL's and IADL's - pt can benefit from OT services to adress impairements to return to prior level of function    OT Occupational Profile and History Problem Focused Assessment - Including review of records relating to presenting problem    Occupational performance deficits (Please refer to evaluation for details): ADL's;IADL's;Work;Play;Leisure;Social Participation    Body Structure / Function / Physical Skills ADL;Coordination;FMC;Flexibility;Dexterity;Strength;Pain;UE functional use;ROM;IADL;Scar mobility;Sensation    Rehab Potential Good    Clinical Decision Making Limited treatment options, no task modification necessary    Comorbidities Affecting Occupational Performance: None    Modification or Assistance to Complete Evaluation  No modification of tasks or assist necessary to complete eval    OT Frequency 2x / week    OT Duration 6 weeks    OT Treatment/Interventions Self-care/ADL training;Moist Heat;Fluidtherapy;Splinting;Contrast Bath;Therapeutic exercise;Ultrasound;Scar mobilization;Passive range of motion;Paraffin;Manual Therapy;Patient/family education    Consulted and Agree with Plan of Care Patient             Patient will benefit from skilled therapeutic intervention in order to improve the following deficits and impairments:   Body Structure / Function / Physical Skills: ADL,  Coordination, FMC, Flexibility, Dexterity, Strength, Pain, UE functional use, ROM, IADL, Scar mobility, Sensation       Visit Diagnosis: Scar condition and fibrosis of skin  Dupuytren's contracture of left hand  Pain in left hand  Muscle weakness (generalized)  Stiffness of left hand, not elsewhere classified    Problem List Patient Active Problem List   Diagnosis Date Noted   Acute respiratory failure with hypoxemia (HCC) 05/24/2016   SVT (supraventricular tachycardia) (HCC) 04/27/2014   Gastro-esophageal reflux disease without esophagitis 04/26/2014   COPD with asthma (HCC) 04/26/2014   Low back sprain 09/15/2013   Current tobacco use 03/06/2013   Allergic rhinitis 07/13/2010   Allergic to food 07/13/2010   Chronic obstructive pulmonary disease (HCC) 08/14/2000    Oletta Cohn, OTR/L,CLT 02/16/2021, 11:04 AM  [place] [facility] [address] [place], [address] Phone: [phone]   Fax:  [phone]  Name: Kristi Walter MRN: [id number] Date of Birth: Aug 31, 1960

## 2021-02-23 ENCOUNTER — Ambulatory Visit: Payer: Managed Care, Other (non HMO) | Admitting: Occupational Therapy

## 2021-02-23 DIAGNOSIS — L905 Scar conditions and fibrosis of skin: Secondary | ICD-10-CM | POA: Diagnosis not present

## 2021-02-23 DIAGNOSIS — M6281 Muscle weakness (generalized): Secondary | ICD-10-CM

## 2021-02-23 DIAGNOSIS — M720 Palmar fascial fibromatosis [Dupuytren]: Secondary | ICD-10-CM

## 2021-02-23 DIAGNOSIS — M79642 Pain in left hand: Secondary | ICD-10-CM

## 2021-02-23 DIAGNOSIS — M25642 Stiffness of left hand, not elsewhere classified: Secondary | ICD-10-CM

## 2021-02-23 NOTE — Therapy (Signed)
[place] [facility] [address], [place], [address] Phone: [phone]   Fax:  [phone]  Occupational Therapy Treatment  Patient Details  Name: Kristi Walter MRN: [id number] Date of Birth: 1961-04-12 Referring Provider (OT): Marney Doctor   Encounter Date: 02/23/2021   OT End of Session - 02/23/21 1610     Visit Number 4    Number of Visits 12    Date for OT Re-Evaluation 04/04/21    OT Start Time 1530    OT Stop Time 1606    OT Time Calculation (min) 36 min    Activity Tolerance Patient tolerated treatment well    Behavior During Therapy [facility] for tasks assessed/performed             Past Medical History:  Diagnosis Date   Asthma    COPD (chronic obstructive pulmonary disease) (HCC)    Dyspnea    GERD (gastroesophageal reflux disease)    Orthopnea    Pneumonia    Wheezing     Past Surgical History:  Procedure Laterality Date   ABDOMINAL HYSTERECTOMY  1989   Cardiac laser ablation  2015   [facility]   CATARACT EXTRACTION [facility] Left 06/04/2017   Procedure: CATARACT EXTRACTION PHACO AND INTRAOCULAR LENS PLACEMENT (IOC);  Surgeon: Galen Manila, MD;  Location: [facility] ORS;  Service: Ophthalmology;  Laterality: Left;  [place] 00:58.5 AP% 22.3 CDE 13.03 Fluid Pack Lot # [id number]   CATARACT EXTRACTION W/PHACO Right 2020   CESAREAN SECTION     x2   DUPUYTREN CONTRACTURE RELEASE Left 01/10/2021   Procedure: DUPUYTREN CONTRACTURE RELEASE LEFT RING AND LEFT LITTLE FINGER;  Surgeon: Christena Flake, MD;  Location: [facility] ORS;  Service: Orthopedics;  Laterality: Left;   FOOT SURGERY Left    tendon laceration repair   pyloric     stenosis surgery    There were no vitals filed for this visit.   Subjective Assessment - 02/23/21 1609     Subjective  I used my hand more - wedding was good and beach was pretty - but I think I lost some motion straigthening it - the scar still tender on the finger    Pertinent  History Pt had on 01/10/21 L hand Dupuytrens release by Dr Joice Lofts - pt stiches were removed on 10-12/22 and refer to OT    Patient Stated Goals Want to be able grip with my hand , get the stiffness, tightness better- and open my fingers to put on gloves, hand in my pocket and wash my face    Currently in Pain? No/denies                [facility] OT Assessment - 02/23/21 0001       Left Hand AROM   L Ring  MCP 0-90 85 Degrees   0 ext   L Ring PIP 0-100 100 Degrees   -15 ext   L Little  MCP 0-90 85 Degrees   0 ext   L Little PIP 0-100 90 Degrees   -40             Pt return after being out of town with increase flexor contracture in 5th PIP - could not tolerate finger base splint 2 wks ago         OT Treatments/Exercises (OP) - 02/23/21 0001       LUE Paraffin   Number Minutes Paraffin 8 Minutes    LUE Paraffin Location Hand  Comments increase extention of 5th PIP - used LMB PIP extention splint              Pt to cont with LMB PIP extention splint after heat at home prior to   Rolling over foam roller 20 reps - palmar scar massage and digits extention stretch  And manaul scar massage done by OT  and mini massager - focus on volar 5th digit this date and fitted with new 2 siliicon digisleeves on 5th for scar provided for day time and night time under splint  Cica scar pad night time   Tendon glides AROM blocking - focus on extention every time  3 x day    Done mini massager on palmar scar and  focus 5th digit- great success as well as manual massage Fabricated  dorsal hand base extention splint for 4th and 5th digits to wear night time and some during day pain free  Used LMB splint in paraffin this date          OT Education - 02/23/21 1610     Education Details progress and changes to HEP    Person(s) Educated Patient    Methods Explanation;Demonstration;Tactile cues;Verbal cues;Handout    Comprehension Verbal cues required;Returned  demonstration;Verbalized understanding              OT Short Term Goals - 02/07/21 1808       OT SHORT TERM GOAL #1   Title Pt to be independent in HEP to decrease scar tissue to show increase extention and flexoin of digits and able to tolerate different textures and gripping objects without increase symptoms    Baseline tender for scar massage - cannot tolerate towel drying hand or objects gripping in palm- pain or sensitivity increase to 3/10    Time 3    Period Weeks    Status New    Target Date 02/28/21               OT Long Term Goals - 02/07/21 1810       OT LONG TERM GOAL #1   Title L hand digits extnetion increaase to WNL to donn glove, reach hand in pocket and wash face or body    Baseline 5th PIP -40 , 4th MC -25 and PIP -10    Time 5    Period Weeks    Status New    Target Date 03/14/21      OT LONG TERM GOAL #2   Title L hand digits improve for pt to touch palm without increase symtoms to squeeze washcloth , hold utencil  and steeringwheel    Baseline flexion decrease in MC's 70-80 and 5th PIP 75 - tender or sensitive in palm with different textures    Time 6    Period Weeks    Status New    Target Date 03/21/21      OT LONG TERM GOAL #3   Title L hand grip and prehension strength increase to more than 60% compare to R hand to carry groceries, push and pull doors , turn doorknob    Baseline NT- pt 4 wks s/p    Time 8    Period Weeks    Status New    Target Date 04/04/21                   Plan - 02/23/21 1612     Clinical Impression Statement Pt present at OT eval with s/p L hand dupuytrens release  hand and 5th digit into volar PIP and middle phalange on 01/10/21- pt is 6 1/2 wks s/p. Pt showed great progress in scar sensitivity and scar tenderness. Tolerated massage, textures much better and report using hand and feeling more strength. Pt was not able to tolerate finger base splint fabricated 2 wks ago and was out of town for few days -and  return this date with increase flexor contracture at 5th PIP - fabricated hand base dorsal splint for 4thand 5th digit extention to use at night time in combination with her scar pads and can wear some during day when sitting watching tv -  focus this date on  volar 5th digit scar mobs  and digit extention - pt to cont with scar management, stretches pain free and ROM  - monitor extention with use of night time extention splint -  scar , pain and stiffness still  limiting her functional use of L hand in ADL's and IADL's - pt can benefit from OT services to adress impairements to return to prior level of function    OT Occupational Profile and History Problem Focused Assessment - Including review of records relating to presenting problem    Occupational performance deficits (Please refer to evaluation for details): ADL's;IADL's;Work;Play;Leisure;Social Participation    Body Structure / Function / Physical Skills ADL;Coordination;FMC;Flexibility;Dexterity;Strength;Pain;UE functional use;ROM;IADL;Scar mobility;Sensation    Rehab Potential Good    Clinical Decision Making Limited treatment options, no task modification necessary    Comorbidities Affecting Occupational Performance: None    Modification or Assistance to Complete Evaluation  No modification of tasks or assist necessary to complete eval    OT Frequency 1x / week    OT Duration 6 weeks    OT Treatment/Interventions Self-care/ADL training;Moist Heat;Fluidtherapy;Splinting;Contrast Bath;Therapeutic exercise;Ultrasound;Scar mobilization;Passive range of motion;Paraffin;Manual Therapy;Patient/family education    Consulted and Agree with Plan of Care Patient             Patient will benefit from skilled therapeutic intervention in order to improve the following deficits and impairments:   Body Structure / Function / Physical Skills: ADL, Coordination, FMC, Flexibility, Dexterity, Strength, Pain, UE functional use, ROM, IADL, Scar mobility,  Sensation       Visit Diagnosis: Scar condition and fibrosis of skin  Dupuytren's contracture of left hand  Pain in left hand  Muscle weakness (generalized)  Stiffness of left hand, not elsewhere classified    Problem List Patient Active Problem List   Diagnosis Date Noted   Acute respiratory failure with hypoxemia (HCC) 05/24/2016   SVT (supraventricular tachycardia) (HCC) 04/27/2014   Gastro-esophageal reflux disease without esophagitis 04/26/2014   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 [place], [address] Phone: [phone]   Fax:  [phone]  Name: Kristi Walter MRN: [id number] Date of Birth: 12-Jul-1960

## 2021-03-01 ENCOUNTER — Ambulatory Visit: Payer: Managed Care, Other (non HMO) | Admitting: Occupational Therapy

## 2021-03-04 ENCOUNTER — Other Ambulatory Visit: Payer: Self-pay | Admitting: Internal Medicine

## 2021-03-06 ENCOUNTER — Ambulatory Visit: Payer: Managed Care, Other (non HMO) | Admitting: Occupational Therapy

## 2021-03-06 DIAGNOSIS — M720 Palmar fascial fibromatosis [Dupuytren]: Secondary | ICD-10-CM

## 2021-03-06 DIAGNOSIS — L905 Scar conditions and fibrosis of skin: Secondary | ICD-10-CM

## 2021-03-06 DIAGNOSIS — M25642 Stiffness of left hand, not elsewhere classified: Secondary | ICD-10-CM

## 2021-03-06 DIAGNOSIS — M79642 Pain in left hand: Secondary | ICD-10-CM

## 2021-03-06 DIAGNOSIS — M6281 Muscle weakness (generalized): Secondary | ICD-10-CM

## 2021-03-06 NOTE — Therapy (Signed)
[place] [facility] [address], [place], [address] Phone: [phone]   Fax:  [phone]  Occupational Therapy Treatment  Patient Details  Name: Kristi Walter MRN: [id number] Date of Birth: [birth year] Referring Provider (OT): Marney Doctor   Encounter Date: 03/06/2021   OT End of Session - 03/06/21 1431     Visit Number 5    Number of Visits 12    Date for OT Re-Evaluation 04/04/21    OT Start Time 1430    OT Stop Time 1510    OT Time Calculation (min) 40 min    Activity Tolerance Patient tolerated treatment well    Behavior During Therapy [facility] for tasks assessed/performed             Past Medical History:  Diagnosis Date   Asthma    COPD (chronic obstructive pulmonary disease) (HCC)    Dyspnea    GERD (gastroesophageal reflux disease)    Orthopnea    Pneumonia    Wheezing     Past Surgical History:  Procedure Laterality Date   ABDOMINAL HYSTERECTOMY  1989   Cardiac laser ablation  2015   [facility]   CATARACT EXTRACTION [facility] Left 06/04/2017   Procedure: CATARACT EXTRACTION PHACO AND INTRAOCULAR LENS PLACEMENT (IOC);  Surgeon: Galen Manila, MD;  Location: [facility] ORS;  Service: Ophthalmology;  Laterality: Left;  [place] 00:58.5 AP% 22.3 CDE 13.03 Fluid Pack Lot # [id number] H   CATARACT EXTRACTION W/PHACO Right 2020   CESAREAN SECTION     x2   DUPUYTREN CONTRACTURE RELEASE Left 01/10/2021   Procedure: DUPUYTREN CONTRACTURE RELEASE LEFT RING AND LEFT LITTLE FINGER;  Surgeon: Christena Flake, MD;  Location: [facility] ORS;  Service: Orthopedics;  Laterality: Left;   FOOT SURGERY Left    tendon laceration repair   pyloric     stenosis surgery    There were no vitals filed for this visit.   Subjective Assessment - 03/06/21 1430     Subjective  I missed last weeks appt - had to cancel -was sick -the pinkie tight and stiff- but I am using my hand more and not as tender the scar    Pertinent History Pt  had on 01/10/21 L hand Dupuytrens release by Dr Joice Lofts - pt stiches were removed on 10-12/22 and refer to OT    Patient Stated Goals Want to be able grip with my hand , get the stiffness, tightness better- and open my fingers to put on gloves, hand in my pocket and wash my face    Currently in Pain? Yes    Pain Score 3     Pain Location Finger (Comment which one)    Pain Orientation Left    Pain Descriptors / Indicators Aching;Tightness    Pain Type Surgical pain    Pain Onset More than a month ago    Pain Frequency Intermittent                [facility] OT Assessment - 03/06/21 0001       Strength   Right Hand Grip (lbs) 50    Right Hand Lateral Pinch 18 lbs    Right Hand 3 Point Pinch 17 lbs    Left Hand Lateral Pinch 15 lbs    Left Hand 3 Point Pinch 14 lbs      Left Hand AROM   L Little  MCP 0-90 85 Degrees   0   L Little PIP 0-100 85 Degrees   -  30                     OT Treatments/Exercises (OP) - 03/06/21 0001       LUE Paraffin   Number Minutes Paraffin 8 Minutes    LUE Paraffin Location Hand    Comments LMB extention splint on 5th prior to soft tissue and PROM             Pt to cont with LMB PIP extention splint after heat at home prior to ROM  Rolling over foam roller 20 reps - palmar scar massage and digits extention stretch  Manaul scar massage done by OT   and using mini massager - focus on volar 5th digit this date  -and massage over cica scar pad  And provided again new 2 siliicon digisleeves on 5th for scar provided for day time and night time under splint  Cica scar pad night time    Tendon glides  AAROM and AROM blocking - focus on extention every time  3 x day    Cont  dorsal hand base extention splint for 4th and 5th digits to wear night time and  reinforce to wear some during day pain free  Used LMB splint in paraffin this date   Increase to 2 x wk next week - felt PIP extention and scar was more tight this date since seen last time         OT Education - 03/06/21 1431     Education Details progress and changes to HEP    Person(s) Educated Patient    Methods Explanation;Demonstration;Tactile cues;Verbal cues;Handout    Comprehension Verbal cues required;Returned demonstration;Verbalized understanding              OT Short Term Goals - 02/07/21 1808       OT SHORT TERM GOAL #1   Title Pt to be independent in HEP to decrease scar tissue to show increase extention and flexoin of digits and able to tolerate different textures and gripping objects without increase symptoms    Baseline tender for scar massage - cannot tolerate towel drying hand or objects gripping in palm- pain or sensitivity increase to 3/10    Time 3    Period Weeks    Status New    Target Date 02/28/21               OT Long Term Goals - 02/07/21 1810       OT LONG TERM GOAL #1   Title L hand digits extnetion increaase to WNL to donn glove, reach hand in pocket and wash face or body    Baseline 5th PIP -40 , 4th MC -25 and PIP -10    Time 5    Period Weeks    Status New    Target Date 03/14/21      OT LONG TERM GOAL #2   Title L hand digits improve for pt to touch palm without increase symtoms to squeeze washcloth , hold utencil  and steeringwheel    Baseline flexion decrease in MC's 70-80 and 5th PIP 75 - tender or sensitive in palm with different textures    Time 6    Period Weeks    Status New    Target Date 03/21/21      OT LONG TERM GOAL #3   Title L hand grip and prehension strength increase to more than 60% compare to R hand to carry groceries, push and pull doors ,  turn doorknob    Baseline NT- pt 4 wks s/p    Time 8    Period Weeks    Status New    Target Date 04/04/21                   Plan - 03/06/21 1432     Clinical Impression Statement Pt present at OT eval with s/p L hand dupuytrens release hand and 5th digit into volar PIP and middle phalange on 01/10/21- pt is 8  wks s/p. Pt showed great  progress in scar sensitivity and scar tenderness. Tolerated massage, textures much better and report using hand and feeling more strength.Pt was not seen last week and show increase ttightness in 5th digit PIP extention She is wearing the  hand base dorsal splint for 4thand 5th digit extention about 5 hrs at  night time in combination with her scar pads and can wear some during day when sitting watching tv -  focus this date on  volar 5th digit scar mobs over cica scar pad manually and using mini massager  and digit extention - pt to cont with scar management, stretches pain free and ROM  - monitor extention with use of night time extention splint -  scar , pain and stiffness still  limiting her functional use of L hand in ADL's and IADL's - pt can benefit from OT services to adress impairements to return to prior level of function    OT Occupational Profile and History Problem Focused Assessment - Including review of records relating to presenting problem    Occupational performance deficits (Please refer to evaluation for details): ADL's;IADL's;Work;Play;Leisure;Social Participation    Body Structure / Function / Physical Skills ADL;Coordination;FMC;Flexibility;Dexterity;Strength;Pain;UE functional use;ROM;IADL;Scar mobility;Sensation    Rehab Potential Good    Clinical Decision Making Limited treatment options, no task modification necessary    Comorbidities Affecting Occupational Performance: None    Modification or Assistance to Complete Evaluation  No modification of tasks or assist necessary to complete eval    OT Frequency 1x / week    OT Duration 6 weeks    OT Treatment/Interventions Self-care/ADL training;Moist Heat;Fluidtherapy;Splinting;Contrast Bath;Therapeutic exercise;Ultrasound;Scar mobilization;Passive range of motion;Paraffin;Manual Therapy;Patient/family education    Consulted and Agree with Plan of Care Patient             Patient will benefit from skilled therapeutic  intervention in order to improve the following deficits and impairments:   Body Structure / Function / Physical Skills: ADL, Coordination, FMC, Flexibility, Dexterity, Strength, Pain, UE functional use, ROM, IADL, Scar mobility, Sensation       Visit Diagnosis: Scar condition and fibrosis of skin  Dupuytren's contracture of left hand  Pain in left hand  Muscle weakness (generalized)  Stiffness of left hand, not elsewhere classified    Problem List Patient Active Problem List   Diagnosis Date Noted   Acute respiratory failure with hypoxemia (HCC) 05/24/2016   SVT (supraventricular tachycardia) (HCC) 04/27/2014   Gastro-esophageal reflux disease without esophagitis 04/26/2014   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 [place], [address] Phone: [phone]   Fax:  [phone]  Name: Kristi Walter MRN: [id number] Date of Birth: 1960/09/01

## 2021-03-14 ENCOUNTER — Ambulatory Visit: Payer: Managed Care, Other (non HMO) | Admitting: Occupational Therapy

## 2021-03-16 ENCOUNTER — Ambulatory Visit: Payer: Managed Care, Other (non HMO) | Attending: Student | Admitting: Occupational Therapy

## 2021-03-16 DIAGNOSIS — M79642 Pain in left hand: Secondary | ICD-10-CM | POA: Diagnosis present

## 2021-03-16 DIAGNOSIS — M720 Palmar fascial fibromatosis [Dupuytren]: Secondary | ICD-10-CM | POA: Insufficient documentation

## 2021-03-16 DIAGNOSIS — L905 Scar conditions and fibrosis of skin: Secondary | ICD-10-CM | POA: Diagnosis not present

## 2021-03-16 DIAGNOSIS — M25642 Stiffness of left hand, not elsewhere classified: Secondary | ICD-10-CM | POA: Diagnosis present

## 2021-03-16 DIAGNOSIS — M6281 Muscle weakness (generalized): Secondary | ICD-10-CM | POA: Insufficient documentation

## 2021-03-16 NOTE — Therapy (Signed)
[facility] [address] [place], [address] Phone: [phone]   Fax:  [phone]  Occupational Therapy Treatment  Patient Details  Name: Kristi Walter MRN: [id number] Date of Birth: 22-Jun-1960 Referring Provider (OT): Marney Doctor   Encounter Date: 03/16/2021   OT End of Session - 03/16/21 1538     Visit Number 6    Number of Visits 12    Date for OT Re-Evaluation 04/04/21    OT Start Time 1455    OT Stop Time 1532    OT Time Calculation (min) 37 min    Activity Tolerance Patient tolerated treatment well    Behavior During Therapy [facility] for tasks assessed/performed             Past Medical History:  Diagnosis Date   Asthma    COPD (chronic obstructive pulmonary disease) (HCC)    Dyspnea    GERD (gastroesophageal reflux disease)    Orthopnea    Pneumonia    Wheezing     Past Surgical History:  Procedure Laterality Date   ABDOMINAL HYSTERECTOMY  1989   Cardiac laser ablation  2015   [facility]   CATARACT EXTRACTION [facility] Left 06/04/2017   Procedure: CATARACT EXTRACTION PHACO AND INTRAOCULAR LENS PLACEMENT (IOC);  Surgeon: Galen Manila, MD;  Location: [facility] ORS;  Service: Ophthalmology;  Laterality: Left;  [place] 00:58.5 AP% 22.3 CDE 13.03 Fluid Pack Lot # [id number]   CATARACT EXTRACTION W/PHACO Right 2020   CESAREAN SECTION     x2   DUPUYTREN CONTRACTURE RELEASE Left 01/10/2021   Procedure: DUPUYTREN CONTRACTURE RELEASE LEFT RING AND LEFT LITTLE FINGER;  Surgeon: Christena Flake, MD;  Location: [facility] ORS;  Service: Orthopedics;  Laterality: Left;   FOOT SURGERY Left    tendon laceration repair   pyloric     stenosis surgery    There were no vitals filed for this visit.   Subjective Assessment - 03/16/21 1537     Subjective  Using it more - did do the massage and scar pad- worked it - wanted to crack open little because my hands stays dry - but doing okay    Pertinent History Pt had  on 01/10/21 L hand Dupuytrens release by Dr Joice Lofts - pt stiches were removed on 10-12/22 and refer to OT    Patient Stated Goals Want to be able grip with my hand , get the stiffness, tightness better- and open my fingers to put on gloves, hand in my pocket and wash my face    Currently in Pain? No/denies                [facility] OT Assessment - 03/16/21 0001       Strength   Right Hand Grip (lbs) 50    Right Hand Lateral Pinch 18 lbs    Right Hand 3 Point Pinch 17 lbs    Left Hand Grip (lbs) 39    Left Hand Lateral Pinch 14 lbs    Left Hand 3 Point Pinch 14 lbs                      OT Treatments/Exercises (OP) - 03/16/21 0001       LUE Paraffin   Number Minutes Paraffin 8 Minutes    LUE Paraffin Location Hand    Comments LMB splint on for extention stretch             Pt to cont  with LMB PIP extention splint after heat at home prior to ROM  Rolling over foam roller 20 reps - palmar scar massage and digits extention stretch  Manaul scar massage done by OT   and using mini massager - focus on volar 5th digit this date  -and massage over cica scar pad  And provided again new 2 siliicon digisleeves on 5th for scar provided for day time and night time under splint  Cica scar pad night time    Tendon glides  AAROM and AROM blocking - focus on extention every time  3 x day    Cont  dorsal hand base extention splint for 4th and 5th digits to wear night time and  reinforce to wear some during day pain free  Used LMB splint in paraffin this date   Provided blue firm foam block to use on volar 5th digit in splint to increase stretch compare to 4th          OT Education - 03/16/21 1538     Education Details progress and changes to HEP    Person(s) Educated Patient    Methods Explanation;Demonstration;Tactile cues;Verbal cues;Handout    Comprehension Verbal cues required;Returned demonstration;Verbalized understanding              OT Short Term Goals -  02/07/21 1808       OT SHORT TERM GOAL #1   Title Pt to be independent in HEP to decrease scar tissue to show increase extention and flexoin of digits and able to tolerate different textures and gripping objects without increase symptoms    Baseline tender for scar massage - cannot tolerate towel drying hand or objects gripping in palm- pain or sensitivity increase to 3/10    Time 3    Period Weeks    Status New    Target Date 02/28/21               OT Long Term Goals - 02/07/21 1810       OT LONG TERM GOAL #1   Title L hand digits extnetion increaase to WNL to donn glove, reach hand in pocket and wash face or body    Baseline 5th PIP -40 , 4th MC -25 and PIP -10    Time 5    Period Weeks    Status New    Target Date 03/14/21      OT LONG TERM GOAL #2   Title L hand digits improve for pt to touch palm without increase symtoms to squeeze washcloth , hold utencil  and steeringwheel    Baseline flexion decrease in MC's 70-80 and 5th PIP 75 - tender or sensitive in palm with different textures    Time 6    Period Weeks    Status New    Target Date 03/21/21      OT LONG TERM GOAL #3   Title L hand grip and prehension strength increase to more than 60% compare to R hand to carry groceries, push and pull doors , turn doorknob    Baseline NT- pt 4 wks s/p    Time 8    Period Weeks    Status New    Target Date 04/04/21                   Plan - 03/16/21 1539     Clinical Impression Statement Pt present at OT eval with s/p L hand dupuytrens release hand and 5th digit into volar PIP and  middle phalange on 01/10/21- pt is 9  wks s/p. Pt showed great progress in scar sensitivity and scar tenderness. Tolerated massage, textures much better and report using hand and  more strength.Pt  show increase flexion of 5th but decrease PIP extention to -35. She is wearing the  hand base dorsal splint for 4thand 5th digit extention about 5 hrs at  night time in combination with her scar  pads and can wear some during day when sitting watching tv - did provided dense foam to increase stretch on 5th compare to 4th -  focus this date on  volar 5th digit scar mobs over cica scar pad manually and using mini massager  and digit extention - pt to cont with scar management, stretches pain free and ROM  - monitor extention with use of night time extention splint -  scar , pain and stiffness still  limiting her functional use of L hand in ADL's and IADL's - pt can benefit from OT services to adress impairements to return to prior level of function    OT Occupational Profile and History Problem Focused Assessment - Including review of records relating to presenting problem    Occupational performance deficits (Please refer to evaluation for details): ADL's;IADL's;Work;Play;Leisure;Social Participation    Body Structure / Function / Physical Skills ADL;Coordination;FMC;Flexibility;Dexterity;Strength;Pain;UE functional use;ROM;IADL;Scar mobility;Sensation    Rehab Potential Good    Clinical Decision Making Limited treatment options, no task modification necessary    Comorbidities Affecting Occupational Performance: None    Modification or Assistance to Complete Evaluation  No modification of tasks or assist necessary to complete eval    OT Frequency 1x / week    OT Duration 6 weeks    OT Treatment/Interventions Self-care/ADL training;Moist Heat;Fluidtherapy;Splinting;Contrast Bath;Therapeutic exercise;Ultrasound;Scar mobilization;Passive range of motion;Paraffin;Manual Therapy;Patient/family education    Consulted and Agree with Plan of Care Patient             Patient will benefit from skilled therapeutic intervention in order to improve the following deficits and impairments:   Body Structure / Function / Physical Skills: ADL, Coordination, FMC, Flexibility, Dexterity, Strength, Pain, UE functional use, ROM, IADL, Scar mobility, Sensation       Visit Diagnosis: Scar condition and  fibrosis of skin  Dupuytren's contracture of left hand  Pain in left hand  Muscle weakness (generalized)  Stiffness of left hand, not elsewhere classified    Problem List Patient Active Problem List   Diagnosis Date Noted   Acute respiratory failure with hypoxemia (HCC) 05/24/2016   SVT (supraventricular tachycardia) (HCC) 04/27/2014   Gastro-esophageal reflux disease without esophagitis 04/26/2014   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 [place], [address] Phone: [phone]   Fax:  [phone]  Name: Kristi Walter MRN: [id number] Date of Birth: 05-Nov-1960

## 2021-03-23 ENCOUNTER — Ambulatory Visit: Payer: Managed Care, Other (non HMO) | Admitting: Occupational Therapy

## 2021-03-23 DIAGNOSIS — L905 Scar conditions and fibrosis of skin: Secondary | ICD-10-CM | POA: Diagnosis not present

## 2021-03-23 DIAGNOSIS — M6281 Muscle weakness (generalized): Secondary | ICD-10-CM

## 2021-03-23 DIAGNOSIS — M25642 Stiffness of left hand, not elsewhere classified: Secondary | ICD-10-CM

## 2021-03-23 DIAGNOSIS — M79642 Pain in left hand: Secondary | ICD-10-CM

## 2021-03-23 DIAGNOSIS — M720 Palmar fascial fibromatosis [Dupuytren]: Secondary | ICD-10-CM

## 2021-03-23 NOTE — Therapy (Signed)
[place] [facility] [address] [place], [address] Phone: [phone]   Fax:  [phone]  Occupational Therapy Treatment  Patient Details  Name: Kristi Walter MRN: [id number] Date of Birth: 1960/06/28 Referring Provider (OT): Marney Doctor   Encounter Date: 03/23/2021   OT End of Session - 03/23/21 1624     Visit Number 7    Number of Visits 12    Date for OT Re-Evaluation 04/04/21    OT Start Time 1610    OT Stop Time 1646    OT Time Calculation (min) 36 min    Activity Tolerance Patient tolerated treatment well    Behavior During Therapy [facility] for tasks assessed/performed             Past Medical History:  Diagnosis Date   Asthma    COPD (chronic obstructive pulmonary disease) (HCC)    Dyspnea    GERD (gastroesophageal reflux disease)    Orthopnea    Pneumonia    Wheezing     Past Surgical History:  Procedure Laterality Date   ABDOMINAL HYSTERECTOMY  1989   Cardiac laser ablation  2015   [facility]   CATARACT EXTRACTION [facility] Left 06/04/2017   Procedure: CATARACT EXTRACTION PHACO AND INTRAOCULAR LENS PLACEMENT (IOC);  Surgeon: Galen Manila, MD;  Location: [facility] ORS;  Service: Ophthalmology;  Laterality: Left;  [place] 00:58.5 AP% 22.3 CDE 13.03 Fluid Pack Lot # [id number] H   CATARACT EXTRACTION W/PHACO Right 2020   CESAREAN SECTION     x2   DUPUYTREN CONTRACTURE RELEASE Left 01/10/2021   Procedure: DUPUYTREN CONTRACTURE RELEASE LEFT RING AND LEFT LITTLE FINGER;  Surgeon: Christena Flake, MD;  Location: [facility] ORS;  Service: Orthopedics;  Laterality: Left;   FOOT SURGERY Left    tendon laceration repair   pyloric     stenosis surgery    There were no vitals filed for this visit.   Subjective Assessment - 03/23/21 1622     Subjective  I brought my splint in for you to look at - doing better and using it more    Pertinent History Pt had on 01/10/21 L hand Dupuytrens release by Dr Joice Lofts - pt  stiches were removed on 10-12/22 and refer to OT    Patient Stated Goals Want to be able grip with my hand , get the stiffness, tightness better- and open my fingers to put on gloves, hand in my pocket and wash my face    Currently in Pain? No/denies                [facility] OT Assessment - 03/23/21 0001       Left Hand AROM   L Little  MCP 0-90 85 Degrees    L Little PIP 0-100 90 Degrees   -28                     OT Treatments/Exercises (OP) - 03/23/21 0001       LUE Paraffin   Number Minutes Paraffin 8 Minutes    LUE Paraffin Location Hand    Comments prior to soft tissue mobs and ROM            Pt to cont with LMB PIP extention splint after heat at home prior to ROM  Rolling over foam roller 20 reps - palmar scar massage and digits extention stretch  Manaul scar massage done by OT   and using mini massager - focus  on volar 5th digit and distal to [facility] -and massage over cica scar pad  And provided again new 2 siliicon digisleeves on 5th for scar provided for day time and night time under splint  Cica scar pad night time    Composite PROM for flexion- to palm - pain less than 2/10  Done PROM and stretches to PIP extention -and prolonged stretch    3 x day    Cont  dorsal hand base extention splint for 4th and 5th digits to wear night time and  reinforce to wear some during day pain free Adjusted - with new strap to decrease sliding of splint   Used LMB splint in paraffin this date   Provided green dense foam lock to use on volar 5th digit in splint under strap to increase stretch compare to 4th           OT Education - 03/23/21 1624     Education Details progress and changes to HEP    Person(s) Educated Patient    Methods Explanation;Demonstration;Tactile cues;Verbal cues;Handout    Comprehension Verbal cues required;Returned demonstration;Verbalized understanding              OT Short Term Goals - 02/07/21 1808       OT SHORT TERM GOAL  #1   Title Pt to be independent in HEP to decrease scar tissue to show increase extention and flexoin of digits and able to tolerate different textures and gripping objects without increase symptoms    Baseline tender for scar massage - cannot tolerate towel drying hand or objects gripping in palm- pain or sensitivity increase to 3/10    Time 3    Period Weeks    Status New    Target Date 02/28/21               OT Long Term Goals - 02/07/21 1810       OT LONG TERM GOAL #1   Title L hand digits extnetion increaase to WNL to donn glove, reach hand in pocket and wash face or body    Baseline 5th PIP -40 , 4th MC -25 and PIP -10    Time 5    Period Weeks    Status New    Target Date 03/14/21      OT LONG TERM GOAL #2   Title L hand digits improve for pt to touch palm without increase symtoms to squeeze washcloth , hold utencil  and steeringwheel    Baseline flexion decrease in MC's 70-80 and 5th PIP 75 - tender or sensitive in palm with different textures    Time 6    Period Weeks    Status New    Target Date 03/21/21      OT LONG TERM GOAL #3   Title L hand grip and prehension strength increase to more than 60% compare to R hand to carry groceries, push and pull doors , turn doorknob    Baseline NT- pt 4 wks s/p    Time 8    Period Weeks    Status New    Target Date 04/04/21                   Plan - 03/23/21 1624     Clinical Impression Statement Pt  s/p L hand dupuytrens release hand and 5th digit into volar PIP and middle phalange on 01/10/21- pt is 10  wks s/p. Pt showed great progress in scar sensitivity and scar tenderness.  Tolerated massage, textures much better and report using hand and  more strength.Pt  show increase flexion of 5th but  cont to be limited in PIP extention at -30. She is wearing the  hand base dorsal splint for 4thand 5th digit extention about 5 hrs at  night time in combination with her scar pads and can wear some during day when sitting  watching tv -did reassess splint and add strap and dense foam to increase stretch on 5th compare to 4th -  focus this date on  volar 5th digit scar mobs over cica scar pad manually and using mini massager  and digit extention - pt to cont with scar management, stretches pain free and ROM  -Scar and stiffness still  limiting her functional use of L hand in ADL's and IADL's - pt can benefit from OT services to adress impairements to return to prior level of function    OT Occupational Profile and History Problem Focused Assessment - Including review of records relating to presenting problem    Occupational performance deficits (Please refer to evaluation for details): ADL's;IADL's;Work;Play;Leisure;Social Participation    Body Structure / Function / Physical Skills ADL;Coordination;FMC;Flexibility;Dexterity;Strength;Pain;UE functional use;ROM;IADL;Scar mobility;Sensation    Rehab Potential Good    Clinical Decision Making Limited treatment options, no task modification necessary    Comorbidities Affecting Occupational Performance: None    Modification or Assistance to Complete Evaluation  No modification of tasks or assist necessary to complete eval    OT Frequency 1x / week    OT Treatment/Interventions Self-care/ADL training;Moist Heat;Fluidtherapy;Splinting;Contrast Bath;Therapeutic exercise;Ultrasound;Scar mobilization;Passive range of motion;Paraffin;Manual Therapy;Patient/family education    Consulted and Agree with Plan of Care Patient             Patient will benefit from skilled therapeutic intervention in order to improve the following deficits and impairments:   Body Structure / Function / Physical Skills: ADL, Coordination, FMC, Flexibility, Dexterity, Strength, Pain, UE functional use, ROM, IADL, Scar mobility, Sensation       Visit Diagnosis: Scar condition and fibrosis of skin  Dupuytren's contracture of left hand  Pain in left hand  Muscle weakness  (generalized)  Stiffness of left hand, not elsewhere classified    Problem List Patient Active Problem List   Diagnosis Date Noted   Acute respiratory failure with hypoxemia (HCC) 05/24/2016   SVT (supraventricular tachycardia) (HCC) 04/27/2014   Gastro-esophageal reflux disease without esophagitis 04/26/2014   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, [place], [address] Phone: [phone]   Fax:  [phone]  Name: Kristi Walter MRN: [id number] Date of Birth: 31-Dec-1960

## 2021-03-30 ENCOUNTER — Ambulatory Visit: Payer: Managed Care, Other (non HMO) | Admitting: Occupational Therapy

## 2021-05-03 ENCOUNTER — Other Ambulatory Visit: Payer: Self-pay | Admitting: Internal Medicine

## 2021-05-03 DIAGNOSIS — J449 Chronic obstructive pulmonary disease, unspecified: Secondary | ICD-10-CM

## 2021-05-24 ENCOUNTER — Other Ambulatory Visit: Payer: Self-pay

## 2021-05-24 DIAGNOSIS — R0602 Shortness of breath: Secondary | ICD-10-CM

## 2021-05-30 ENCOUNTER — Other Ambulatory Visit: Payer: Self-pay | Admitting: Internal Medicine

## 2021-06-05 ENCOUNTER — Telehealth: Payer: Self-pay

## 2021-06-05 NOTE — Telephone Encounter (Signed)
Left vm and sent mychart message to confirm 06/07/21 appointment-Toni °

## 2021-06-07 ENCOUNTER — Ambulatory Visit: Payer: Managed Care, Other (non HMO) | Admitting: Internal Medicine

## 2021-06-07 ENCOUNTER — Other Ambulatory Visit: Payer: Self-pay

## 2021-06-07 DIAGNOSIS — R0602 Shortness of breath: Secondary | ICD-10-CM

## 2021-06-13 ENCOUNTER — Other Ambulatory Visit: Payer: Self-pay | Admitting: Internal Medicine

## 2021-06-20 ENCOUNTER — Other Ambulatory Visit: Payer: Self-pay

## 2021-06-20 ENCOUNTER — Ambulatory Visit: Payer: Managed Care, Other (non HMO) | Admitting: Internal Medicine

## 2021-06-20 ENCOUNTER — Encounter: Payer: Self-pay | Admitting: Internal Medicine

## 2021-06-20 VITALS — BP 102/80 | HR 96 | Temp 98.4°F | Resp 16 | Ht 62.5 in | Wt 157.8 lb

## 2021-06-20 DIAGNOSIS — F17219 Nicotine dependence, cigarettes, with unspecified nicotine-induced disorders: Secondary | ICD-10-CM | POA: Diagnosis not present

## 2021-06-20 DIAGNOSIS — J449 Chronic obstructive pulmonary disease, unspecified: Secondary | ICD-10-CM | POA: Diagnosis not present

## 2021-06-20 DIAGNOSIS — R0602 Shortness of breath: Secondary | ICD-10-CM

## 2021-06-20 DIAGNOSIS — Z9981 Dependence on supplemental oxygen: Secondary | ICD-10-CM | POA: Diagnosis not present

## 2021-06-20 NOTE — Procedures (Signed)
[facility] [address] [place], [address]    Complete Pulmonary Function Testing Interpretation:  FINDINGS:  Forced vital capacity is severely decreased FEV1 is 0.58 L which is 25% of predicted and is severely decreased.  Fev1 FVC ratio is moderately decreased.  Total lung capacity is normal residual volume is increased residual volume total lung capacity ratio is increased FRC is increased.  DLCO was not recordable.  Postbronchodilator no significant change in FEV1  IMPRESSION:  The pulmonary function study is consistent with very severe obstructive lung disease clinical correlation is recommended  Yevonne Pax, MD [facility] Pulmonary Critical Care Medicine Sleep Medicine

## 2021-06-20 NOTE — Progress Notes (Signed)
[facility] ?[address] ?[place], [place] [address] ? ?Pulmonary Sleep Medicine  ? ?Office Visit Note ? ?Patient Name: Kristi Walter ?DOB: [birth year] ?MRN [id number] ? ?Date of Service: 06/20/2021 ? ?Complaints/HPI: COPD. Very severe disease and she states she has been using her oxygen more frequently. Patient states it does help when she gets SOB. She feels she can get more done when she is using the oxygen. No admssions. PFT was done and shows again very severe reduction in the FEV1  ? ?ROS ? ?General: (-) fever, (-) chills, (-) night sweats, (-) weakness ?Skin: (-) rashes, (-) itching,. ?Eyes: (-) visual changes, (-) redness, (-) itching. ?Nose and Sinuses: (-) nasal stuffiness or itchiness, (-) postnasal drip, (-) nosebleeds, (-) sinus trouble. ?Mouth and Throat: (-) sore throat, (-) hoarseness. ?Neck: (-) swollen glands, (-) enlarged thyroid, (-) neck pain. ?Respiratory: + cough, (-) bloody sputum, + shortness of breath, - wheezing. ?Cardiovascular: - ankle swelling, (-) chest pain. ?Lymphatic: (-) lymph node enlargement. ?Neurologic: (-) numbness, (-) tingling. ?Psychiatric: (-) anxiety, (-) depression ? ? ?Current Medication: ?Outpatient Encounter Medications as of 06/20/2021  ?Medication Sig  ? albuterol (VENTOLIN HFA) 108 (90 Base) MCG/ACT inhaler INHALE 1 TO 2 INHALATIONS  BY MOUTH INTO THE LUNGS  EVERY 4 HOURS AS NEEDED FOR WHEEZING OR SHORTNESS OF  BREATH  ? benzonatate (TESSALON) 100 MG capsule TAKE 1 CAPSULE BY MOUTH  EVERY 12 HOURS AS NEEDED  FOR COUGH  ? EPINEPHrine 0.3 mg/0.3 mL IJ SOAJ injection Inject 0.3 mg into the muscle as needed for anaphylaxis.  ? HYDROcodone-acetaminophen (NORCO) 5-325 MG tablet Take 1-2 tablets by mouth every 6 (six) hours as needed for moderate pain or severe pain. MAXIMUM TOTAL ACETAMINOPHEN DOSE IS 4000 MG PER DAY  ? ipratropium-albuterol (DUONEB) 0.5-2.5 (3) MG/3ML SOLN USE 1 VIAL VIA NEBULIZER  EVERY 6 HOURS AS NEEDED  ? montelukast (SINGULAIR) 10 MG tablet  TAKE 1 TABLET BY MOUTH  DAILY  ? omeprazole (PRILOSEC) 20 MG capsule Take 1 capsule (20 mg total) by mouth daily.  ? OXYGEN Inhale 5 L into the lungs. At night pt uses [facility]  ? Polyethyl Glycol-Propyl Glycol (LUBRICANT EYE DROPS) 0.4-0.3 % SOLN Place 1-2 drops into both eyes 3 (three) times daily as needed (dry/irritated eyes).  ? SPIRIVA HANDIHALER 18 MCG inhalation capsule INHALE THE CONTENTS OF 1  CAPSULE BY MOUTH VIA  HANDIHALER ONCE DAILY (Patient taking differently: Place 18 mcg into inhaler and inhale at bedtime. INHALE THE CONTENTS OF 1  CAPSULE BY MOUTH VIA  HANDIHALER ONCE DAILY)  ? SYMBICORT 160-4.5 MCG/ACT inhaler INHALE 2 INHALATIONS BY  MOUTH TWICE DAILY  ? theophylline (UNIPHYL) 400 MG 24 hr tablet TAKE 1 TABLET BY MOUTH  DAILY  ? ?No facility-administered encounter medications on file as of 06/20/2021.  ? ? ?Surgical History: ?Past Surgical History:  ?Procedure Laterality Date  ? ABDOMINAL HYSTERECTOMY  1989  ? Cardiac laser ablation  2015  ? [facility]  ? CATARACT EXTRACTION W/PHACO Left 06/04/2017  ? Procedure: CATARACT EXTRACTION PHACO AND INTRAOCULAR LENS PLACEMENT (IOC);  Surgeon: Galen Manila, MD;  Location: [facility] ORS;  Service: Ophthalmology;  Laterality: Left;  [place] 00:58.5 ?AP% 22.3 ?CDE 13.03 ?Fluid Pack Lot # [id number]  ? CATARACT EXTRACTION W/PHACO Right 2020  ? CESAREAN SECTION    ? x2  ? 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 Procedure: DUPUYTREN CONTRACTURE RELEASE LEFT RING AND LEFT LITTLE FINGER;  Surgeon: Christena Flake, MD;  Location: [facility] ORS;  Service: Orthopedics;  Laterality: Left;  ? FOOT SURGERY Left   ? tendon laceration repair  ? pyloric    ? stenosis surgery  ? ? ?Medical History: ?Past Medical History:  ?Diagnosis Date  ? Asthma   ? COPD (chronic obstructive pulmonary disease) (HCC)   ? Dyspnea   ? GERD (gastroesophageal reflux disease)   ? Orthopnea   ? Pneumonia   ? Wheezing   ? ? ?Family History: ?Family History  ?Problem Relation Age of Onset  ?  Hyperlipidemia Mother   ? Hypertension Mother   ? Stroke Mother   ? ? ?Social History: ?Social History  ? ?Socioeconomic History  ? Marital status: Widowed  ?  Spouse name: Not on file  ? Number of children: Not on file  ? Years of education: Not on file  ? Highest education level: Not on file  ?Occupational History  ? Not on file  ?Tobacco Use  ? Smoking status: Former  ?  Packs/day: 0.25  ?  Years: 30.00  ?  Pack years: 7.50  ?  Types: Cigarettes  ?  Quit date: 07/30/2017  ?  Years since quitting: 3.8  ? Smokeless tobacco: Never  ?Vaping Use  ? Vaping Use: Never used  ?Substance and Sexual Activity  ? Alcohol use: No  ? Drug use: No  ? Sexual activity: Not on file  ?Other Topics Concern  ? Not on file  ?Social History Narrative  ? Lives with 2 sons  ? ?Social Determinants of Health  ? ?Financial Resource Strain: Not on file  ?Food Insecurity: Not on file  ?Transportation Needs: Not on file  ?Physical Activity: Not on file  ?Stress: Not on file  ?Social Connections: Not on file  ?Intimate Partner Violence: Not on file  ? ? ?Vital Signs: ?Blood pressure 102/80, pulse 96, temperature 98.4 ?F (36.9 ?C), resp. rate 16, height 5' 2.5" (1.588 m), weight 157 lb 12.8 oz (71.6 kg), SpO2 92 %. ? ?Examination: ?General Appearance: The patient is well-developed, well-nourished, and in no distress. ?Skin: Gross inspection of skin unremarkable. ?Head: normocephalic, no gross deformities. ?Eyes: no gross deformities noted. ?ENT: ears appear grossly normal no exudates. ?Neck: Supple. No thyromegaly. No LAD. ?Respiratory: distant few rhonchi noted. ?Cardiovascular: Normal S1 and S2 without murmur or rub. ?Extremities: No cyanosis. pulses are equal. ?Neurologic: Alert and oriented. No involuntary movements. ? ?LABS: ?No results found for this or any previous visit (from the past 2160 hour(s)). ? ?Radiology: ?[place] OR NERVE BLOCK-IMAGE ONLY [facility]) ? ?Result Date: 01/10/2021 ?There is no interpretation for this exam.  This order is for  images obtained during a surgical procedure.  Please See "Surgeries" Tab for more information regarding the procedure.  ? ? ?No results found. ? ?No results found. ? ? ? ?Assessment and Plan: ?Patient Active Problem List  ? Diagnosis Date Noted  ? Acute respiratory failure with hypoxemia (HCC) 05/24/2016  ? SVT (supraventricular tachycardia) (HCC) 04/27/2014  ? Gastro-esophageal reflux disease without esophagitis 04/26/2014  ? COPD with asthma (HCC) 04/26/2014  ? Low back sprain 09/15/2013  ? Current tobacco use 03/06/2013  ? Allergic rhinitis 07/13/2010  ? Allergic to food 07/13/2010  ? Chronic obstructive pulmonary disease (HCC) 08/14/2000  ? ? ?1. Chronic obstructive pulmonary disease, unspecified COPD type (HCC) ?Severe disease she is using her inhalers seems to be gaining benefit from the inhalers ? ?2. SOB (shortness of breath) ?Due to above somewhat stable ? ?3. Cigarette nicotine dependence with nicotine-induced disorder ?Smoking cessation instruction/counseling  given:  commended patient for quitting and reviewed strategies for preventing relapses  ? ?4. Oxygen dependent ?On 3lpm would use continuous  ? ?General Counseling: I have discussed the findings of the evaluation and examination with Alonna.  I have also discussed any further diagnostic evaluation thatmay be needed or ordered today. Kenzey verbalizes understanding of the findings of todays visit. We also reviewed her medications today and discussed drug interactions and side effects including but not limited excessive drowsiness and altered mental states. We also discussed that there is always a risk not just to her but also people around her. she has been encouraged to call the office with any questions or concerns that should arise related to todays visit. ? ?No orders of the defined types were placed in this encounter. ?  ? ?Time spent: 45 ? ?I have personally obtained a history, examined the patient, evaluated laboratory and imaging results,  formulated the assessment and plan and placed orders. ? ?  ?Yevonne Pax, MD FCCP ?Pulmonary and Critical Care ?Sleep medicine ?

## 2021-06-20 NOTE — Patient Instructions (Signed)

## 2021-06-21 ENCOUNTER — Encounter: Payer: Self-pay | Admitting: Internal Medicine

## 2021-06-21 LAB — PULMONARY FUNCTION TEST

## 2021-06-27 ENCOUNTER — Ambulatory Visit: Payer: Managed Care, Other (non HMO) | Admitting: Internal Medicine

## 2021-08-17 ENCOUNTER — Encounter
Admission: EM | Disposition: A | Discharge status: Home / Self Care | Payer: Self-pay | Source: Home / Self Care | Attending: Internal Medicine

## 2021-08-17 ENCOUNTER — Other Ambulatory Visit: Payer: Self-pay

## 2021-08-17 ENCOUNTER — Inpatient Hospital Stay: Payer: Managed Care, Other (non HMO) | Admitting: Certified Registered Nurse Anesthetist

## 2021-08-17 ENCOUNTER — Inpatient Hospital Stay
Admission: EM | Admit: 2021-08-17 | Discharge: 2021-08-19 | DRG: 584 | Disposition: A | Discharge status: Home / Self Care | Payer: Managed Care, Other (non HMO) | Attending: Internal Medicine | Admitting: Internal Medicine

## 2021-08-17 ENCOUNTER — Inpatient Hospital Stay: Payer: Managed Care, Other (non HMO)

## 2021-08-17 ENCOUNTER — Encounter: Payer: Self-pay | Admitting: Emergency Medicine

## 2021-08-17 ENCOUNTER — Emergency Department: Payer: Managed Care, Other (non HMO)

## 2021-08-17 DIAGNOSIS — Z9841 Cataract extraction status, right eye: Secondary | ICD-10-CM | POA: Diagnosis not present

## 2021-08-17 DIAGNOSIS — N61 Mastitis without abscess: Secondary | ICD-10-CM | POA: Diagnosis present

## 2021-08-17 DIAGNOSIS — Z91038 Other insect allergy status: Secondary | ICD-10-CM | POA: Diagnosis not present

## 2021-08-17 DIAGNOSIS — Z91010 Allergy to peanuts: Secondary | ICD-10-CM

## 2021-08-17 DIAGNOSIS — Z9981 Dependence on supplemental oxygen: Secondary | ICD-10-CM | POA: Diagnosis not present

## 2021-08-17 DIAGNOSIS — J9610 Chronic respiratory failure, unspecified whether with hypoxia or hypercapnia: Secondary | ICD-10-CM | POA: Diagnosis present

## 2021-08-17 DIAGNOSIS — Z1629 Resistance to other single specified antibiotic: Secondary | ICD-10-CM | POA: Diagnosis present

## 2021-08-17 DIAGNOSIS — Z91018 Allergy to other foods: Secondary | ICD-10-CM

## 2021-08-17 DIAGNOSIS — N611 Abscess of the breast and nipple: Secondary | ICD-10-CM | POA: Diagnosis present

## 2021-08-17 DIAGNOSIS — Z91012 Allergy to eggs: Secondary | ICD-10-CM

## 2021-08-17 DIAGNOSIS — Z9842 Cataract extraction status, left eye: Secondary | ICD-10-CM

## 2021-08-17 DIAGNOSIS — Z79899 Other long term (current) drug therapy: Secondary | ICD-10-CM

## 2021-08-17 DIAGNOSIS — J449 Chronic obstructive pulmonary disease, unspecified: Secondary | ICD-10-CM | POA: Diagnosis present

## 2021-08-17 DIAGNOSIS — Z7951 Long term (current) use of inhaled steroids: Secondary | ICD-10-CM | POA: Diagnosis not present

## 2021-08-17 DIAGNOSIS — Z961 Presence of intraocular lens: Secondary | ICD-10-CM | POA: Diagnosis present

## 2021-08-17 DIAGNOSIS — K219 Gastro-esophageal reflux disease without esophagitis: Secondary | ICD-10-CM | POA: Diagnosis present

## 2021-08-17 DIAGNOSIS — Z720 Tobacco use: Secondary | ICD-10-CM | POA: Diagnosis present

## 2021-08-17 DIAGNOSIS — L03818 Cellulitis of other sites: Principal | ICD-10-CM

## 2021-08-17 DIAGNOSIS — Z91030 Bee allergy status: Secondary | ICD-10-CM | POA: Diagnosis not present

## 2021-08-17 HISTORY — PX: IRRIGATION AND DEBRIDEMENT ABSCESS: SHX5252

## 2021-08-17 LAB — CBC WITH DIFFERENTIAL/PLATELET
Abs Immature Granulocytes: 0.03 10*3/uL (ref 0.00–0.07)
Basophils Absolute: 0.1 10*3/uL (ref 0.0–0.1)
Basophils Relative: 1 %
Eosinophils Absolute: 0.1 10*3/uL (ref 0.0–0.5)
Eosinophils Relative: 1 %
HCT: 38.7 % (ref 36.0–46.0)
Hemoglobin: 12.5 g/dL (ref 12.0–15.0)
Immature Granulocytes: 0 %
Lymphocytes Relative: 23 %
Lymphs Abs: 1.9 10*3/uL (ref 0.7–4.0)
MCH: 30.5 pg (ref 26.0–34.0)
MCHC: 32.3 g/dL (ref 30.0–36.0)
MCV: 94.4 fL (ref 80.0–100.0)
Monocytes Absolute: 0.6 10*3/uL (ref 0.1–1.0)
Monocytes Relative: 7 %
Neutro Abs: 5.8 10*3/uL (ref 1.7–7.7)
Neutrophils Relative %: 68 %
Platelets: 261 10*3/uL (ref 150–400)
RBC: 4.1 MIL/uL (ref 3.87–5.11)
RDW: 11.9 % (ref 11.5–15.5)
WBC: 8.4 10*3/uL (ref 4.0–10.5)
nRBC: 0 % (ref 0.0–0.2)

## 2021-08-17 LAB — COMPREHENSIVE METABOLIC PANEL
ALT: 11 U/L (ref 0–44)
AST: 14 U/L — ABNORMAL LOW (ref 15–41)
Albumin: 3.8 g/dL (ref 3.5–5.0)
Alkaline Phosphatase: 74 U/L (ref 38–126)
Anion gap: 9 (ref 5–15)
BUN: 6 mg/dL (ref 6–20)
CO2: 30 mmol/L (ref 22–32)
Calcium: 9.4 mg/dL (ref 8.9–10.3)
Chloride: 96 mmol/L — ABNORMAL LOW (ref 98–111)
Creatinine, Ser: 0.65 mg/dL (ref 0.44–1.00)
GFR, Estimated: >60 mL/min (ref >60)
Glucose, Bld: 95 mg/dL (ref 70–99)
Potassium: 4.4 mmol/L (ref 3.5–5.1)
Sodium: 135 mmol/L (ref 135–145)
Total Bilirubin: 0.6 mg/dL (ref 0.3–1.2)
Total Protein: 7.3 g/dL (ref 6.5–8.1)

## 2021-08-17 LAB — LACTIC ACID, PLASMA: Lactic Acid, Venous: 1.4 mmol/L (ref 0.5–1.9)

## 2021-08-17 SURGERY — IRRIGATION AND DEBRIDEMENT ABSCESS
Anesthesia: Monitor Anesthesia Care | Laterality: Left

## 2021-08-17 MED ORDER — LIDOCAINE HCL (CARDIAC) PF 100 MG/5ML IV SOSY
PREFILLED_SYRINGE | INTRAVENOUS | Status: DC | PRN
  Administered 2021-08-17: 60 mg via INTRAVENOUS

## 2021-08-17 MED ORDER — MIDAZOLAM HCL 2 MG/2ML IJ SOLN
INTRAMUSCULAR | Status: AC
  Administered 2021-08-17: 1 mg
  Filled 2021-08-17: qty 2

## 2021-08-17 MED ORDER — IPRATROPIUM-ALBUTEROL 0.5-2.5 (3) MG/3ML IN SOLN
3.0000 mL | Freq: Once | RESPIRATORY_TRACT | Status: AC
Start: 2021-08-17 — End: 2021-08-17

## 2021-08-17 MED ORDER — IPRATROPIUM-ALBUTEROL 0.5-2.5 (3) MG/3ML IN SOLN
RESPIRATORY_TRACT | Status: AC
  Administered 2021-08-17: 3 mL via RESPIRATORY_TRACT
  Filled 2021-08-17: qty 3

## 2021-08-17 MED ORDER — ALBUTEROL SULFATE HFA 108 (90 BASE) MCG/ACT IN AERS
INHALATION_SPRAY | RESPIRATORY_TRACT | Status: DC | PRN
  Administered 2021-08-17: 2 via RESPIRATORY_TRACT

## 2021-08-17 MED ORDER — DEXMEDETOMIDINE (PRECEDEX) IN NS 20 MCG/5ML (4 MCG/ML) IV SYRINGE
PREFILLED_SYRINGE | INTRAVENOUS | Status: DC | PRN
  Administered 2021-08-17: 8 ug via INTRAVENOUS

## 2021-08-17 MED ORDER — TRAMADOL HCL 50 MG PO TABS: 50.0000 mg | ORAL_TABLET | Freq: Four times a day (QID) | ORAL | Status: DC | PRN

## 2021-08-17 MED ORDER — FENTANYL CITRATE (PF) 100 MCG/2ML IJ SOLN
INTRAMUSCULAR | Status: AC
Start: 2021-08-17 — End: ?
  Filled 2021-08-17: qty 2

## 2021-08-17 MED ORDER — ROPIVACAINE HCL 5 MG/ML IJ SOLN
INTRAMUSCULAR | Status: DC | PRN
  Administered 2021-08-17: 25 mL

## 2021-08-17 MED ORDER — ONDANSETRON HCL 4 MG/2ML IJ SOLN: 4.0000 mg | Freq: Once | INTRAMUSCULAR | Status: DC | PRN

## 2021-08-17 MED ORDER — FENTANYL CITRATE (PF) 100 MCG/2ML IJ SOLN
INTRAMUSCULAR | Status: DC | PRN
  Administered 2021-08-17: 25 ug via INTRAVENOUS

## 2021-08-17 MED ORDER — LIDOCAINE HCL (PF) 1 % IJ SOLN
INTRAMUSCULAR | Status: AC
  Filled 2021-08-17: qty 5

## 2021-08-17 MED ORDER — EPHEDRINE SULFATE (PRESSORS) 50 MG/ML IJ SOLN
INTRAMUSCULAR | Status: DC | PRN
Start: 2021-08-17 — End: 2021-08-17
  Administered 2021-08-17 (×2): 10 mg via INTRAVENOUS
  Administered 2021-08-17: 5 mg via INTRAVENOUS

## 2021-08-17 MED ORDER — ONDANSETRON HCL 4 MG/2ML IJ SOLN
INTRAMUSCULAR | Status: AC
  Filled 2021-08-17: qty 2

## 2021-08-17 MED ORDER — PANTOPRAZOLE SODIUM 40 MG PO TBEC
40.0000 mg | DELAYED_RELEASE_TABLET | Freq: Every day | ORAL | Status: DC
  Administered 2021-08-17 – 2021-08-19 (×3): 40 mg via ORAL
  Filled 2021-08-17 (×3): qty 1

## 2021-08-17 MED ORDER — GLYCOPYRROLATE 0.2 MG/ML IJ SOLN
INTRAMUSCULAR | Status: DC | PRN
Start: 2021-08-17 — End: 2021-08-17
  Administered 2021-08-17: .1 mg via INTRAVENOUS

## 2021-08-17 MED ORDER — HYDROCODONE-ACETAMINOPHEN 5-325 MG PO TABS
1.0000 | ORAL_TABLET | ORAL | Status: DC | PRN
  Administered 2021-08-18 – 2021-08-19 (×5): 1 via ORAL
  Filled 2021-08-17 (×5): qty 1

## 2021-08-17 MED ORDER — OXYCODONE HCL 5 MG PO TABS
5.0000 mg | ORAL_TABLET | Freq: Once | ORAL | Status: AC
  Administered 2021-08-17: 5 mg via ORAL

## 2021-08-17 MED ORDER — CEFAZOLIN SODIUM-DEXTROSE 1-4 GM/50ML-% IV SOLN
1.0000 g | Freq: Three times a day (TID) | INTRAVENOUS | Status: DC
  Administered 2021-08-17 – 2021-08-18 (×2): 1 g via INTRAVENOUS
  Filled 2021-08-17 (×4): qty 50

## 2021-08-17 MED ORDER — MIDAZOLAM HCL 2 MG/2ML IJ SOLN: 1.0000 mg | Freq: Once | INTRAMUSCULAR | Status: AC

## 2021-08-17 MED ORDER — POLYVINYL ALCOHOL 1.4 % OP SOLN
1.0000 [drp] | Freq: Three times a day (TID) | OPHTHALMIC | Status: DC | PRN
  Filled 2021-08-17: qty 15

## 2021-08-17 MED ORDER — PROPOFOL 10 MG/ML IV BOLUS
INTRAVENOUS | Status: AC
  Filled 2021-08-17: qty 20

## 2021-08-17 MED ORDER — OXYCODONE HCL 5 MG PO TABS
ORAL_TABLET | ORAL | Status: AC
  Filled 2021-08-17: qty 1

## 2021-08-17 MED ORDER — THEOPHYLLINE ER 400 MG PO TB24
400.0000 mg | ORAL_TABLET | Freq: Every day | ORAL | Status: DC
  Administered 2021-08-18 – 2021-08-19 (×2): 400 mg via ORAL
  Filled 2021-08-17 (×3): qty 1

## 2021-08-17 MED ORDER — ONDANSETRON HCL 4 MG/2ML IJ SOLN: 4.0000 mg | Freq: Four times a day (QID) | INTRAMUSCULAR | Status: DC | PRN

## 2021-08-17 MED ORDER — ONDANSETRON HCL 4 MG/2ML IJ SOLN
INTRAMUSCULAR | Status: DC | PRN
Start: 2021-08-17 — End: 2021-08-17
  Administered 2021-08-17: 4 mg via INTRAVENOUS

## 2021-08-17 MED ORDER — MONTELUKAST SODIUM 10 MG PO TABS
10.0000 mg | ORAL_TABLET | Freq: Every day | ORAL | Status: DC
  Administered 2021-08-18 – 2021-08-19 (×2): 10 mg via ORAL
  Filled 2021-08-17 (×2): qty 1

## 2021-08-17 MED ORDER — ONDANSETRON HCL 4 MG/2ML IJ SOLN
4.0000 mg | Freq: Once | INTRAMUSCULAR | Status: AC
  Administered 2021-08-17: 4 mg via INTRAVENOUS
  Filled 2021-08-17: qty 2

## 2021-08-17 MED ORDER — PROPOFOL 10 MG/ML IV BOLUS
INTRAVENOUS | Status: DC | PRN
  Administered 2021-08-17: 20 mg via INTRAVENOUS

## 2021-08-17 MED ORDER — SODIUM CHLORIDE (PF) 0.9 % IJ SOLN
INTRAMUSCULAR | Status: DC | PRN
  Administered 2021-08-17: 30 mL

## 2021-08-17 MED ORDER — KETOROLAC TROMETHAMINE 15 MG/ML IJ SOLN
15.0000 mg | Freq: Four times a day (QID) | INTRAMUSCULAR | Status: DC
  Administered 2021-08-17 – 2021-08-19 (×6): 15 mg via INTRAVENOUS
  Filled 2021-08-17 (×6): qty 1

## 2021-08-17 MED ORDER — PROPOFOL 500 MG/50ML IV EMUL
INTRAVENOUS | Status: DC | PRN
  Administered 2021-08-17: 80 ug/kg/min via INTRAVENOUS

## 2021-08-17 MED ORDER — VANCOMYCIN HCL IN DEXTROSE 1-5 GM/200ML-% IV SOLN
1000.0000 mg | Freq: Once | INTRAVENOUS | Status: AC
  Administered 2021-08-17: 1000 mg via INTRAVENOUS
  Filled 2021-08-17 (×2): qty 200

## 2021-08-17 MED ORDER — PHENYLEPHRINE 80 MCG/ML (10ML) SYRINGE FOR IV PUSH (FOR BLOOD PRESSURE SUPPORT)
PREFILLED_SYRINGE | INTRAVENOUS | Status: AC
Start: 2021-08-17 — End: ?
  Filled 2021-08-17: qty 10

## 2021-08-17 MED ORDER — SODIUM CHLORIDE 0.9 % IV SOLN
2.0000 g | Freq: Once | INTRAVENOUS | Status: AC
  Administered 2021-08-17: 2 g via INTRAVENOUS
  Filled 2021-08-17: qty 20

## 2021-08-17 MED ORDER — 0.9 % SODIUM CHLORIDE (POUR BTL) OPTIME
TOPICAL | Status: DC | PRN
  Administered 2021-08-17: 500 mL

## 2021-08-17 MED ORDER — FENTANYL CITRATE PF 50 MCG/ML IJ SOSY
25.0000 ug | PREFILLED_SYRINGE | Freq: Once | INTRAMUSCULAR | Status: AC
  Administered 2021-08-17: 25 ug via INTRAVENOUS
  Filled 2021-08-17: qty 1

## 2021-08-17 MED ORDER — EPHEDRINE 5 MG/ML INJ
INTRAVENOUS | Status: AC
  Filled 2021-08-17: qty 5

## 2021-08-17 MED ORDER — FENTANYL CITRATE (PF) 100 MCG/2ML IJ SOLN
25.0000 ug | INTRAMUSCULAR | Status: DC | PRN
  Administered 2021-08-17: 25 ug via INTRAVENOUS

## 2021-08-17 MED ORDER — MORPHINE SULFATE (PF) 2 MG/ML IV SOLN: 2.0000 mg | INTRAVENOUS | Status: DC | PRN

## 2021-08-17 MED ORDER — FENTANYL CITRATE (PF) 100 MCG/2ML IJ SOLN
INTRAMUSCULAR | Status: AC
  Filled 2021-08-17: qty 2

## 2021-08-17 MED ORDER — ALBUTEROL SULFATE (2.5 MG/3ML) 0.083% IN NEBU
2.5000 mg | INHALATION_SOLUTION | RESPIRATORY_TRACT | Status: DC | PRN
  Administered 2021-08-18: 2.5 mg via RESPIRATORY_TRACT
  Filled 2021-08-17: qty 3

## 2021-08-17 MED ORDER — LIDOCAINE HCL (PF) 2 % IJ SOLN
INTRAMUSCULAR | Status: AC
  Filled 2021-08-17: qty 5

## 2021-08-17 MED ORDER — ROPIVACAINE HCL 5 MG/ML IJ SOLN
INTRAMUSCULAR | Status: AC
  Filled 2021-08-17: qty 30

## 2021-08-17 MED ORDER — PHENYLEPHRINE 80 MCG/ML (10ML) SYRINGE FOR IV PUSH (FOR BLOOD PRESSURE SUPPORT)
PREFILLED_SYRINGE | INTRAVENOUS | Status: DC | PRN
  Administered 2021-08-17: 200 ug via INTRAVENOUS
  Administered 2021-08-17 (×4): 160 ug via INTRAVENOUS

## 2021-08-17 MED ORDER — BUPIVACAINE-EPINEPHRINE (PF) 0.5% -1:200000 IJ SOLN
INTRAMUSCULAR | Status: AC
  Filled 2021-08-17: qty 30

## 2021-08-17 MED ORDER — DAKINS (1/4 STRENGTH) 0.125 % EX SOLN
CUTANEOUS | Status: AC
  Filled 2021-08-17: qty 473

## 2021-08-17 MED ORDER — MOMETASONE FURO-FORMOTEROL FUM 200-5 MCG/ACT IN AERO
2.0000 | INHALATION_SPRAY | Freq: Two times a day (BID) | RESPIRATORY_TRACT | Status: DC
Start: 2021-08-18 — End: 2021-08-19
  Administered 2021-08-18 – 2021-08-19 (×3): 2 via RESPIRATORY_TRACT
  Filled 2021-08-17: qty 8.8

## 2021-08-17 MED ORDER — BUPIVACAINE LIPOSOME 1.3 % IJ SUSP
INTRAMUSCULAR | Status: AC
  Filled 2021-08-17: qty 20

## 2021-08-17 MED ORDER — TIOTROPIUM BROMIDE MONOHYDRATE 18 MCG IN CAPS
18.0000 ug | ORAL_CAPSULE | Freq: Every day | RESPIRATORY_TRACT | Status: DC
  Administered 2021-08-18 – 2021-08-19 (×2): 18 ug via RESPIRATORY_TRACT
  Filled 2021-08-17: qty 5

## 2021-08-17 MED ORDER — ACETAMINOPHEN 325 MG PO TABS
650.0000 mg | ORAL_TABLET | Freq: Four times a day (QID) | ORAL | Status: DC | PRN
  Administered 2021-08-18: 650 mg via ORAL
  Filled 2021-08-17: qty 2

## 2021-08-17 MED ORDER — LACTATED RINGERS IV SOLN: INTRAVENOUS | Status: DC | PRN

## 2021-08-17 MED ORDER — LIDOCAINE HCL (PF) 1 % IJ SOLN
INTRAMUSCULAR | Status: DC | PRN
  Administered 2021-08-17: 4 mL via SUBCUTANEOUS

## 2021-08-17 MED ORDER — MIDAZOLAM HCL 2 MG/2ML IJ SOLN
INTRAMUSCULAR | Status: AC
  Filled 2021-08-17: qty 2

## 2021-08-17 MED ORDER — ONDANSETRON HCL 4 MG PO TABS: 4.0000 mg | ORAL_TABLET | Freq: Four times a day (QID) | ORAL | Status: DC | PRN

## 2021-08-17 SURGICAL SUPPLY — 28 items
CHLORAPREP W/TINT 26 (MISCELLANEOUS) ×2 IMPLANT
DRAPE LAPAROTOMY 77X122 PED (DRAPES) ×2 IMPLANT
ELECT CAUTERY BLADE TIP 2.5 (TIP) ×2
ELECT REM PT RETURN 9FT ADLT (ELECTROSURGICAL) ×2
ELECTRODE CAUTERY BLDE TIP 2.5 (TIP) ×1 IMPLANT
ELECTRODE REM PT RTRN 9FT ADLT (ELECTROSURGICAL) ×1 IMPLANT
GAUZE 4X4 16PLY ~~LOC~~+RFID DBL (SPONGE) ×2 IMPLANT
GAUZE PACKING 1/2X5YD (GAUZE/BANDAGES/DRESSINGS) ×1 IMPLANT
GAUZE PACKING IODOFORM 1/2 (PACKING) ×1 IMPLANT
GAUZE SPONGE 4X4 12PLY STRL (GAUZE/BANDAGES/DRESSINGS) ×2 IMPLANT
GLOVE SURG SYN 7.0 (GLOVE) ×2 IMPLANT
GLOVE SURG SYN 7.0 PF PI (GLOVE) ×1 IMPLANT
GLOVE SURG SYN 7.5  E (GLOVE) ×1
GLOVE SURG SYN 7.5 E (GLOVE) ×1 IMPLANT
GLOVE SURG SYN 7.5 PF PI (GLOVE) ×1 IMPLANT
GOWN STRL REUS W/ TWL LRG LVL3 (GOWN DISPOSABLE) ×2 IMPLANT
GOWN STRL REUS W/TWL LRG LVL3 (GOWN DISPOSABLE) ×2
KIT TURNOVER KIT A (KITS) ×2 IMPLANT
MANIFOLD NEPTUNE II (INSTRUMENTS) ×2 IMPLANT
NEEDLE HYPO 22GX1.5 SAFETY (NEEDLE) ×2 IMPLANT
NS IRRIG 500ML POUR BTL (IV SOLUTION) ×2 IMPLANT
PACK BASIN MINOR ARMC (MISCELLANEOUS) ×2 IMPLANT
PAD ABD DERMACEA PRESS 5X9 (GAUZE/BANDAGES/DRESSINGS) ×1 IMPLANT
SPONGE T-LAP 18X18 ~~LOC~~+RFID (SPONGE) ×3 IMPLANT
SWAB CULTURE AMIES ANAERIB BLU (MISCELLANEOUS) ×3 IMPLANT
SYR 20ML LL LF (SYRINGE) ×2 IMPLANT
SYR BULB IRRIG 60ML STRL (SYRINGE) ×2 IMPLANT
TAPE TRANSPORE STRL 2 31045 (GAUZE/BANDAGES/DRESSINGS) ×1 IMPLANT

## 2021-08-17 NOTE — Consult Note (Signed)
?Date of Consultation:  08/17/2021 ? ?Requesting Physician:  Artis DelayMary Funke, MD ? ?Reason for Consultation: Left breast abscess ? ?History of Present Illness: ?Kristi Walter is a 61 y.o. female presents to the emergency room for evaluation of worsening left breast pain.  The patient reports that she started noticing some tenderness and discomfort in the left breast about 10 days ago.  She then [facility] walk[facility]in [facility] on 4/27 and was given a course of doxycycline.  The patient reports that she had some GI upset with that including nausea but otherwise has been taking antibiotic.  However she has noticed that the redness and pain have been worsening.  The redness has been spreading through the breast as well.  Today, the swelling and tenderness worsened significantly and she presented again to the [facility] walk[facility]in clinic.  She was advised to come to the emergency room for further evaluation.  Here in the ED, her lab work is overall normal with a normal white blood cell count and electrolytes.  She did have an ultrasound of the left breast which showed a retroareolar fluid collection measuring 4.5 x 1.6 x 3.6 cm. ? ?Of note, the patient has COPD and uses 3 L nasal cannula at home.  He also reports that she had a prior left breast biopsy about 20 years ago which was negative. ? ?Past Medical History: ?Past Medical History:  ?Diagnosis Date  ? Asthma   ? COPD (chronic obstructive pulmonary disease) (HCC)   ? Dyspnea   ? GERD (gastroesophageal reflux disease)   ? Orthopnea   ? Pneumonia   ? Wheezing   ?  ? ?Past Surgical History: ?Past Surgical History:  ?Procedure Laterality Date  ? ABDOMINAL HYSTERECTOMY  1989  ? Cardiac laser [facility]  2015  ? [facility]  ? CATARACT EXTRACTION W/PHACO Left 06/04/2017  ? Procedure: CATARACT EXTRACTION PHACO AND INTRAOCULAR LENS PLACEMENT (IOC);  Surgeon: Galen ManilaPorfilio, William, MD;  Location: [facility] ORS;  Service: Ophthalmology;  Laterality: Left;  US 00:58.5 ?AP% 22.3 ?CDE  13.03 ?Fluid Pack Lot # [id number]  ? CATARACT EXTRACTION W/PHACO Right 2020  ? CESAREAN SECTION    ? x2  ? 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 Procedure: DUPUYTREN CONTRACTURE RELEASE LEFT RING AND LEFT LITTLE FINGER;  Surgeon: Christena FlakePoggi, John J, MD;  Location: [facility] ORS;  Service: Orthopedics;  Laterality: Left;  ? FOOT SURGERY Left   ? tendon laceration repair  ? pyloric    ? stenosis surgery  ? ? ?Home Medications: ?Prior to Admission medications   ?Medication Sig Start Date End Date Taking? Authorizing Provider  ?albuterol (VENTOLIN HFA) 108 (90 Base) MCG/ACT inhaler INHALE 1 TO 2 INHALATIONS  BY MOUTH INTO THE LUNGS  EVERY 4 HOURS AS NEEDED FOR WHEEZING OR SHORTNESS OF  BREATH 05/30/21  Yes Lyndon CodeKhan, Fozia M, MD  ?doxycycline (VIBRA-TABS) 100 MG tablet Take 100 mg by mouth 2 (two) times daily. 08/10/21  Yes [provider]  ?ipratropium-albuterol (DUONEB) 0.5-2.5 (3) MG/3ML SOLN USE 1 VIAL VIA NEBULIZER  EVERY 6 HOURS AS NEEDED 03/05/21  Yes Lyndon CodeKhan, Fozia M, MD  ?montelukast (SINGULAIR) 10 MG tablet TAKE 1 TABLET BY MOUTH  DAILY 07/20/20  Yes Yevonne PaxKhan, Saadat A, MD  ?omeprazole (PRILOSEC) 20 MG capsule Take 1 capsule (20 mg total) by mouth daily. 09/08/20  Yes Sallyanne KusterAbernathy, Alyssa, NP  ?Polyethyl Glycol-Propyl Glycol (LUBRICANT EYE DROPS) 0.4-0.3 % SOLN Place 1-2 drops into both eyes 3 (three) times daily as needed (dry/irritated eyes).  Yes [provider]  ?SPIRIVA HANDIHALER 18 MCG inhalation capsule INHALE THE CONTENTS OF 1  CAPSULE BY MOUTH VIA  HANDIHALER ONCE DAILY 09/07/20  Yes Yevonne Pax, MD  ?[facility] 160-4.5 MCG/ACT inhaler INHALE 2 INHALATIONS BY  MOUTH TWICE DAILY 06/13/21  Yes Yevonne Pax, MD  ?theophylline (UNIPHYL) 400 MG 24 hr tablet TAKE 1 TABLET BY MOUTH  DAILY 05/04/21  Yes Yevonne Pax, MD  ?traMADol (ULTRAM) 50 MG tablet Take 50 mg by mouth every 6 (six) hours as needed. 08/10/21  Yes [provider]  ?EPINEPHrine 0.3 mg/0.3 mL IJ SOAJ injection Inject 0.3 mg into the  muscle as needed for anaphylaxis.    [provider]  ?OXYGEN Inhale 5 L into the lungs. At night pt uses [facility]    [provider]  ? ? ?Allergies: ?Allergies  ?Allergen Reactions  ? Bee Venom Anaphylaxis  ? Eggs Or Egg-Derived Products Anaphylaxis  ? Peanut Oil Anaphylaxis  ? Tomato Anaphylaxis  ? Venomil Honey Bee Venom [Honey Bee Venom] Anaphylaxis  ? Venomil Wasp Venom [Wasp Venom] Anaphylaxis  ? Venomil White [employer]aced [employer] [White Faced [place] Venom] Anaphylaxis  ? ? ?Social History: ? reports that she quit smoking about 4 years ago. Her smoking use included cigarettes. She has a 7.50 pack-year smoking history. She has never used smokeless tobacco. She reports that she does not drink alcohol and does not use drugs.  ? ?Family History: ?Family History  ?Problem Relation Age of Onset  ? Hyperlipidemia Mother   ? Hypertension Mother   ? Stroke Mother   ? ? ?Review of Systems: ?Review of Systems  ?Constitutional:  Negative for chills and fever.  ?HENT:  Negative for hearing loss.   ?Respiratory:  Negative for shortness of breath.   ?Cardiovascular:  Negative for chest pain.  ?Gastrointestinal:  Positive for nausea. Negative for abdominal pain and vomiting.  ?Genitourinary:  Negative for dysuria.  ?Musculoskeletal:  Negative for myalgias.  ?Skin:   ?     Left breast worsening erythema, swelling, and tenderness  ?Neurological:  Negative for dizziness.  ?Psychiatric/Behavioral:  Negative for depression.   ? ?Physical Exam ?BP (!) 143/74 (BP Location: Right Arm)   Pulse 88   Temp 97.9 ?F (36.6 ?C) (Oral)   Resp 16   Ht 5\' 2"  (1.575 m)   Wt 72.6 kg   SpO2 100%   BMI 29.26 kg/m?  ?CONSTITUTIONAL: No acute distress, well-nourished ?HEENT:  Normocephalic, atraumatic, extraocular motion intact. ?NECK: Trachea is midline, and there is no jugular venous distension. ?RESPIRATORY: Nasal cannula in place.  Normal respiratory effort without pathologic use of accessory muscles. ?CARDIOVASCULAR: Regular  rhythm and rate. ?BREAST: Left breast has significant erythema surrounding the areola with induration and bulging of the nipple.  There is an area at the 3:00 that has some mild ulceration of the skin but there is no active drainage at this point.  It is very tender to palpation.  Right breast without any palpable masses, skin changes, or nipple changes. ?MUSCULOSKELETAL:  Normal muscle strength and tone in all four extremities.  No peripheral edema or cyanosis. ?SKIN: Skin turgor is normal. There are no pathologic skin lesions.  ?NEUROLOGIC:  Motor and sensation is grossly normal.  Cranial nerves are grossly intact. ?PSYCH:  Alert and oriented to person, place and time. Affect is normal. ? ?Laboratory Analysis: ?Results for orders placed or performed during the hospital encounter of 08/17/21 (from the past 24 hour(s))  ?Comprehensive metabolic panel  Status: Abnormal  ? Collection Time: 08/17/21 11:52 AM  ?Result Value Ref Range  ? Sodium 135 135 - 145 mmol/L  ? Potassium 4.4 3.5 - 5.1 mmol/L  ? Chloride 96 (L) 98 - 111 mmol/L  ? CO2 30 22 - 32 mmol/L  ? Glucose, Bld 95 70 - 99 mg/dL  ? BUN 6 6 - 20 mg/dL  ? Creatinine, Ser 0.65 0.44 - 1.00 mg/dL  ? Calcium 9.4 8.9 - 10.3 mg/dL  ? Total Protein 7.3 6.5 - 8.1 g/dL  ? Albumin 3.8 3.5 - 5.0 g/dL  ? AST 14 (L) 15 - 41 U/L  ? ALT 11 0 - 44 U/L  ? Alkaline Phosphatase 74 38 - 126 U/L  ? Total Bilirubin 0.6 0.3 - 1.2 mg/dL  ? GFR, Estimated >60 >60 mL/min  ? Anion gap 9 5 - 15  ?CBC with Differential     Status: None  ? Collection Time: 08/17/21 11:52 AM  ?Result Value Ref Range  ? WBC 8.4 4.0 - 10.5 K/uL  ? RBC 4.10 3.87 - 5.11 MIL/uL  ? Hemoglobin 12.5 12.0 - 15.0 g/dL  ? HCT 38.7 36.0 - 46.0 %  ? MCV 94.4 80.0 - 100.0 fL  ? MCH 30.5 26.0 - 34.0 pg  ? MCHC 32.3 30.0 - 36.0 g/dL  ? RDW 11.9 11.5 - 15.5 %  ? Platelets 261 150 - 400 K/uL  ? nRBC 0.0 0.0 - 0.2 %  ? Neutrophils Relative % 68 %  ? Neutro Abs 5.8 1.7 - 7.7 K/uL  ? Lymphocytes Relative 23 %  ? Lymphs Abs 1.9  0.7 - 4.0 K/uL  ? Monocytes Relative 7 %  ? Monocytes Absolute 0.6 0.1 - 1.0 K/uL  ? Eosinophils Relative 1 %  ? Eosinophils Absolute 0.1 0.0 - 0.5 K/uL  ? Basophils Relative 1 %  ? Basophils Absolute 0.1 0.0 -

## 2021-08-17 NOTE — Anesthesia Procedure Notes (Signed)
Anesthesia Regional Block: Other (ESP block)  ? ?Pre-Anesthetic Checklist: , timeout performed,  Correct Patient, Correct Site, Correct Laterality,  Correct Procedure, Correct Position, site marked,  Risks and benefits discussed,  Surgical consent,  Pre-op evaluation,  At surgeon's request and post-op pain management ? ?Laterality: Left ? ?Prep: chloraprep     ?  ?Needles:  ?Injection technique: Single-shot ? ?Needle Type: Stimiplex   ? ? ?Needle Length: 5cm  ?Needle Gauge: 22  ? ? ? ?Additional Needles: ? ? ?Procedures:,,,, ultrasound used (permanent image in chart),,    ?Narrative:  ?Start time: 08/17/2021 5:33 PM ?End time: 08/17/2021 5:35 PM ?Injection made incrementally with aspirations every 5 mL. ? ?Performed by: Personally  ?Anesthesiologist: Lenard Simmer, MD ? ?Additional Notes: ?Functioning IV was confirmed and monitors were applied.  A 36mm 22ga Stimuplex needle was used. Sterile prep and drape,hand hygiene and sterile gloves were used.  Negative aspiration and negative test dose prior to incremental administration of local anesthetic. The patient tolerated the procedure well. ? ? ? ? ? ?

## 2021-08-17 NOTE — Assessment & Plan Note (Signed)
Patient presents to the ER for evaluation of worsening cellulitis and abscess involving the left breast. ?She has failed outpatient antibiotic therapy and has been on doxycycline for 1 week without any improvement in her symptoms ?We will place patient on Ancef 1 g IV every 8 ?Consult surgery for possible I&D ?

## 2021-08-17 NOTE — Progress Notes (Addendum)
1613 ?CHG wipes completed. Gown placed on pt. Dual skin assessed with Bet B LPN. Skin intact. Redness and swelling to L breast and redness to both eyes. Pt states its allergies. Other wise skin intact. Pt ready for surgery and has been NPO ? ?1642 ?Report given to pre op nurse Silvia at this time. ? ?1651 ?Pt being transported to Pre op at this time. ?

## 2021-08-17 NOTE — Anesthesia Procedure Notes (Signed)
Procedure Name: [place] ?Date/Time: 08/17/2021 6:24 PM ?Performed by: Tollie Eth, CRNA ?Pre-anesthesia Checklist: Patient identified, Emergency Drugs available, Suction available and Patient being monitored ?Patient Re-evaluated:Patient Re-evaluated prior to induction ?Oxygen Delivery Method: Simple face mask ?Induction Type: IV induction ?Placement Confirmation: positive ETCO2 ? ? ? ? ?

## 2021-08-17 NOTE — Anesthesia Preprocedure Evaluation (Signed)
Anesthesia Evaluation  ?Patient identified by MRN, date of birth, ID band ?Patient awake ? ? ? ?Reviewed: ?Allergy & Precautions, NPO status , Patient's Chart, lab work & pertinent test results ? ?History of Anesthesia Complications ?Negative for: history of anesthetic complications ? ?Airway ?Mallampati: II ? ?TM Distance: >3 FB ?Neck ROM: Full ? ? ? Dental ? ?(+) Upper Dentures, Lower Dentures, Dental Advidsory Given ?  ?Pulmonary ?shortness of breath and at rest, neg sleep apnea, COPD,  COPD inhaler and oxygen dependent, neg recent URI, Patient abstained from smoking.Not current smoker, former smoker,  ?5L/min O2 at night ?Severe coPD ?  ? ?+ decreased breath sounds(-) wheezing ? ? ? ? ? Cardiovascular ?Exercise Tolerance: Poor ?METS(-) hypertension(-) angina+ Orthopnea  ?(-) CAD and (-) Past MI (-) dysrhythmias  ?Rhythm:Regular Rate:Normal ?- Systolic murmurs ? ?  ?Neuro/Psych ?negative neurological ROS ? negative psych ROS  ? GI/Hepatic ?GERD  Medicated,(+)  ?  ? (-) substance abuse ? ,   ?Endo/Other  ?neg diabetes ? Renal/GU ?negative Renal ROS  ? ?  ?Musculoskeletal ? ? Abdominal ?  ?Peds ? Hematology ?  ?Anesthesia Other Findings ?Past Medical History: ?No date: Asthma ?No date: COPD (chronic obstructive pulmonary disease) ([place]) ?No date: Dyspnea ?No date: GERD (gastroesophageal reflux disease) ?No date: Orthopnea ?No date: Pneumonia ?No date: Wheezing ? Reproductive/Obstetrics ? ?  ? ? ? ? ? ? ? ? ? ? ? ? ? ?  ?  ? ? ? ? ? ? ? ? ?Anesthesia Physical ? ?Anesthesia Plan ? ?ASA: 3 ? ?Anesthesia Plan: MAC  ? ?Post-op Pain Management:  Regional for Post-op pain and Regional block*  ? ?Induction: Intravenous ? ?PONV Risk Score and Plan: 2 and Ondansetron, Treatment may vary due to age or medical condition and Midazolam ? ?Airway Management Planned: Natural Airway ? ?Additional Equipment: None ? ?Intra-op Plan:  ? ?Post-operative Plan:  ? ?Informed Consent: I have reviewed the  patients History and Physical, chart, labs and discussed the procedure including the risks, benefits and alternatives for the proposed anesthesia with the patient or authorized representative who has indicated his/her understanding and acceptance.  ? ? ? ?Dental advisory given ? ?Plan Discussed with: CRNA and Surgeon ? ?Anesthesia Plan Comments: (Patient has concerns given her severe COPD. She prefers regional anesthesia with nerve block as opposed to GETA and I agree with this. Discussed risks of anesthesia with patient, including possibility of difficulty with spontaneous ventilation under anesthesia necessitating airway intervention, PONV, and rare risks such as cardiac or respiratory or neurological events, and allergic reactions. Discussed r/b/a of axillary nerve block, including:  ?- bleeding, infection, nerve damage ?- poor or non functioning block. ?Patient understands. ?)  ? ? ? ? ? ? ?Anesthesia Quick Evaluation ? ?

## 2021-08-17 NOTE — Assessment & Plan Note (Signed)
Stable.  Continue PPI. 

## 2021-08-17 NOTE — Assessment & Plan Note (Addendum)
Continue oxygen supplementation at 3 L and pulse oximetry greater than 92% ?

## 2021-08-17 NOTE — ED Provider Notes (Signed)
? ?[facility] ?Provider Note ? ? ? Event Date/Time  ? First MD Initiated Contact with Patient 08/17/21 1135   ?  (approximate) ? ? ?History  ? ?Breast Pain ? ? ?HPI ? ?Kristi Walter is a 61 y.o. female who comes in with history of chronic oxygen use on 3 L with left breast erythema and nodule around her nipple.  Patient reports that she had symptoms for over a week has been on doxycycline but symptoms have been getting worse with increasing redness spreading up into the rest of her breast and increasing fluctuation near her nipple.  She reports prior mammograms been normal over 4 years ago.  She reports feeling a nodule in her armpit but currently does not feel it. ? ? ?I reviewed the office visit from today where patient was seen on 4/27 for abscess patient currently on doxycycline. ? ?Physical Exam  ? ?Triage Vital Signs: ?ED Triage Vitals [08/17/21 1109]  ?Enc Vitals Group  ?   BP 126/87  ?   Pulse Rate 78  ?   Resp 14  ?   Temp 97.9 ?F (36.6 ?C)  ?   Temp Source Oral  ?   SpO2 96 %  ?   Weight 160 lb (72.6 kg)  ?   Height 5\' 2"  (1.575 m)  ?   Head Circumference   ?   Peak Flow   ?   Pain Score 7  ?   Pain Loc   ?   Pain Edu?   ?   Excl. in GC?   ? ? ?Most recent vital signs: ?Vitals:  ? 08/17/21 1109  ?BP: 126/87  ?Pulse: 78  ?Resp: 14  ?Temp: 97.9 ?F (36.6 ?C)  ?SpO2: 96%  ? ? ? ?General: Awake, no distress.  ?CV:  Good peripheral perfusion.  ?Resp:  Normal effort.  ?Abd:  No distention.  ?Other:  Patient has significant redness noted to the left breast with fluctuation noted in the nipple area.  She is got no lymph node palpated at this time ? ? ?ED Results / Procedures / Treatments  ? ?Labs ?(all labs ordered are listed, but only abnormal results are displayed) ?Labs Reviewed  ?COMPREHENSIVE METABOLIC PANEL  ?CBC WITH DIFFERENTIAL/PLATELET  ? ? ? ? ?RADIOLOGY ?I have reviewed the ultrasound personally concern for possible abscess ? ? ?PROCEDURES: ? ?Critical Care performed:  No ? ?Procedures ? ? ?MEDICATIONS ORDERED IN ED: ?Medications - No data to display ? ? ?IMPRESSION / MDM / ASSESSMENT AND PLAN / ED COURSE  ?I reviewed the triage vital signs and the nursing notes. ? ?Patient comes in with concerns for significant left breast swelling.  Exam is concerning for cellulitis and abscess.  Failing outpatient antibiotics.  Ultrasound ordered to evaluate for any drainable collection.  We will get blood cultures, lactate to further evaluate for sepsis.  Patient given some IV fentanyl IV Zofran to help with symptoms.  We discussed the chance that this could be cancer and recommended following up with her doctor for MRI versus mammogram to further evaluate.  She expressed understanding ? ?Discussed with radiology who states that there is an abscess but does not need to have a ultrasound-guided aspiration given its on the surface.  However given my concern that is involved with the areola and right next the nipple I have discussed the case with Dr. 10/17/21.  He is currently in surgery.  Given the cellulitis associated with it I will discuss with  the hospital team for admission given failed outpatient antibiotics.  At this time patient does not meet sepsis criteria ? ? ?  ? ? ?FINAL CLINICAL IMPRESSION(S) / ED DIAGNOSES  ? ?Final diagnoses:  ?Cellulitis of other specified site  ?Breast abscess  ? ? ? ?Rx / DC Orders  ? ?ED Discharge Orders   ? ? None  ? ?  ? ? ? ?Note:  This document was prepared using Dragon voice recognition software and may include unintentional dictation errors. ?  ?Concha Se, MD ?08/17/21 1406 ? ?

## 2021-08-17 NOTE — Op Note (Signed)
?  Procedure Date:  08/17/2021 ? ?Pre-operative Diagnosis:  left breast abscess ? ?Post-operative Diagnosis: left breast abscess ? ?Procedure:  Incision and Drainage of left breast abscess ? ?Surgeon:  Howie Ill, MD ? ?Anesthesia:  LMA and left ESP block ? ?Estimated Blood Loss:  10 ml ? ?Specimens:  left breast abscess culture swab ? ?Complications:  None ? ?Indications for Procedure:  This is a 61 y.o. female with diagnosis of a left breast abscess, requiring drainage procedure.  The risks of bleeding, abscess or infection, injury to surrounding structures, and need for further procedures were all discussed with the patient and was willing to proceed. ? ?Description of Procedure: ?The patient was correctly identified in the preoperative area and brought into the operating room.  The patient was placed supine with VTE prophylaxis in place.  Appropriate time-outs were performed.  Anesthesia was induced and the patient was intubated.  Appropriate antibiotics were infused. ? ?The patient's left breast was prepped and draped in usual sterile fashion.  The area of most fluctuance was at the 3 o'clock position at the areolar edge.  A 2 cm incision was made over the abscess, revealing purulent fluid.  This fluid was swabbed for culture and sent to micro.  Small Kelly forceps were used to dissect around the abscess tissue to open any remaining pockets of purulent fluid.  The abscess cavity was about 4 x 3 cm.  After drainage was completed, the cavity was irrigated and cleaned.  Local anesthetic was infused intradermally.  The wound was packed with 1/2 inch iodoform gauze and covered with dry gauze and tape.  Breast binder was applied. ? ?The patient was then emerged from anesthesia, LMA removed, and brought to the recovery room for further management. ? ?The patient tolerated the procedure well and all counts were correct at the end of the case. ? ? ?Howie Ill, MD ?  ?

## 2021-08-17 NOTE — Assessment & Plan Note (Signed)
Stable and not acutely exacerbated ?Continue as needed bronchodilator therapy as well as inhaled steroids ?Continue theophylline ?

## 2021-08-17 NOTE — Consult Note (Signed)
PHARMACY -  BRIEF ANTIBIOTIC NOTE  ? ?Pharmacy has received consult(s) for Vancomycin from an ED provider.  The patient's profile has been reviewed for ht/wt/allergies/indication/available labs.   ? ?One time order(s) placed for: ?Vancomycin 1g IV x1 in ED entered ?Pt also receiving Rocephin 2g IV x1 in ED ? ?Further antibiotics/pharmacy consults should be ordered by admitting physician if indicated.       ?                ?Thank you, ?Sherlynn Carbon Ransome Helwig ?08/17/2021  2:05 PM ? ?

## 2021-08-17 NOTE — ED Provider Triage Note (Signed)
Emergency Medicine Provider Triage Evaluation Note ? ?Kristi Walter , a 61 y.o. female  was evaluated in triage.  Pt complains of breast pain and lesion. On antibiotics. No fever.  ? ?Review of Systems  ?Positive: Breast pain. ?Negative: Fever ? ?Physical Exam  ?BP 126/87   Pulse 78   Temp 97.9 ?F (36.6 ?C) (Oral)   Resp 14   Ht 5\' 2"  (1.575 m)   Wt 72.6 kg   SpO2 96%   BMI 29.26 kg/m?  ?Gen:   Awake, no distress   ?Resp:  Normal effort  ?MSK:   Moves extremities without difficulty  ?Other:  Left breast erythema with abscess at about 6 o'clock position. ? ?Medical Decision Making  ?Medically screening exam initiated at 11:18 AM.  Appropriate orders placed.  Estell DIARY LATNER was informed that the remainder of the evaluation will be completed by another provider, this initial triage assessment does not replace that evaluation, and the importance of remaining in the ED until their evaluation is complete. ? ?  ?Victorino Dike, FNP ?08/17/21 1121 ? ?

## 2021-08-17 NOTE — ED Triage Notes (Signed)
Says was seen for breast left redness and swelling. Put on antibiotics on the 27th.  It continuse to worsen with increasing pain and redness. ?

## 2021-08-17 NOTE — Transfer of Care (Signed)
Immediate Anesthesia Transfer of Care Note ? ?Patient: Kristi Walter ? ?Procedure(s) Performed: IRRIGATION AND DEBRIDEMENT LEFT BREAST ABSCESS (Left) ? ?Patient Location: PACU ? ?Anesthesia Type:General ? ?Level of Consciousness: awake and alert  ? ?Airway & Oxygen Therapy: Patient Spontanous Breathing and Patient connected to face mask oxygen ? ?Post-op Assessment: Report given to RN and Post -op Vital signs reviewed and stable ? ?Post vital signs: Reviewed and stable ? ?Last Vitals:  ?Vitals Value Taken Time  ?BP    ?Temp    ?Pulse 123 08/17/21 1919  ?Resp 21 08/17/21 1919  ?SpO2 99 % 08/17/21 1919  ?Vitals shown include unvalidated device data. ? ?Last Pain:  ?Vitals:  ? 08/17/21 1715  ?TempSrc: Tympanic  ?PainSc: 0-No pain  ?   ? ?  ? ?Complications: No notable events documented. ?

## 2021-08-17 NOTE — ED Notes (Signed)
See triage note. Pt in with redness and swelling to L breast. Pt's resp baseline for her COPD; on 3L currently which is her home O2 via [place] baseline; skin dry; alert and calmly laying on stretcher.  ?

## 2021-08-17 NOTE — ED Notes (Signed)
Attempted for 20g at R upper arm.  

## 2021-08-17 NOTE — Assessment & Plan Note (Signed)
Patient continues to smoke but states that she has cut down to 3 cigarettes a day ?Smoking cessation discussed with her in detail  ?She declines a nicotine transdermal patch at this time ?

## 2021-08-17 NOTE — H&P (Signed)
?History and Physical  ? ? ?Patient: Kristi Walter M Mehrer ZOX:[id number] DOB: 1960/06/03 ?DOA: 08/17/2021 ?DOS: the patient was seen and examined on 08/17/2021 ?PCP: Bernerd LimboKoonce, Thomas F, MD  ?Patient coming from: Home ? ?Chief Complaint:  ?Chief Complaint  ?Patient presents with  ? Breast Pain  ? ?HPI: Kristi Norberta KeensM Marcel is a 61 y.o. female with medical history significant for COPD, chronic respiratory failure on 3 L of oxygen continuous, nicotine dependence, GERD who presents to the ER for evaluation of worsening left breast redness and swelling. ?Her symptoms started about 2 weeks ago.  She was seen at the walk-in clinic on 04/27 and prescribed doxycycline 100 mg twice daily for 10 days.  She was also advised to apply warm compress 3 times daily.  Patient has taken the antibiotic for about a week and notices worsening redness and swelling involving the left areolar and extending to the left breast tissue.  She has had no drainage from her left breast.  She denies having any fever or chills.  No known history of breast cancer or family history of same.  Last mammogram was about 4 years ago. ?She complains of nausea which she thinks is related to antibiotic use. ?She denies having any chest pain, no shortness of breath, no headache, no dizziness, no lightheadedness, no abdominal pain, no changes in her bowel habits, no urinary symptoms, no dizziness, no lightheadedness. ? ?Review of Systems: As mentioned in the history of present illness. All other systems reviewed and are negative. ?Past Medical History:  ?Diagnosis Date  ? Asthma   ? COPD (chronic obstructive pulmonary disease) (HCC)   ? Dyspnea   ? GERD (gastroesophageal reflux disease)   ? Orthopnea   ? Pneumonia   ? Wheezing   ? ?Past Surgical History:  ?Procedure Laterality Date  ? ABDOMINAL HYSTERECTOMY  1989  ? Cardiac laser ablation  2015  ? [facility]  ? CATARACT EXTRACTION W/PHACO Left 06/04/2017  ? Procedure: CATARACT EXTRACTION PHACO AND INTRAOCULAR LENS PLACEMENT  (IOC);  Surgeon: Galen ManilaPorfilio, William, MD;  Location: [facility] ORS;  Service: Ophthalmology;  Laterality: Left;  US 00:58.5 ?AP% 22.3 ?CDE 13.03 ?Fluid Pack Lot # [id number]  ? CATARACT EXTRACTION W/PHACO Right 2020  ? CESAREAN SECTION    ? x2  ? 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 Procedure: DUPUYTREN CONTRACTURE RELEASE LEFT RING AND LEFT LITTLE FINGER;  Surgeon: Christena FlakePoggi, John J, MD;  Location: [facility] ORS;  Service: Orthopedics;  Laterality: Left;  ? FOOT SURGERY Left   ? tendon laceration repair  ? pyloric    ? stenosis surgery  ? ?Social History:  reports that she quit smoking about 4 years ago. Her smoking use included cigarettes. She has a 7.50 pack-year smoking history. She has never used smokeless tobacco. She reports that she does not drink alcohol and does not use drugs. ? ?Allergies  ?Allergen Reactions  ? Bee Venom Anaphylaxis  ? Eggs Or Egg-Derived Products Anaphylaxis  ? Peanut Oil Anaphylaxis  ? Tomato Anaphylaxis  ? Venomil Honey Bee Venom [Honey Bee Venom] Anaphylaxis  ? Venomil Wasp Venom [Wasp Venom] Anaphylaxis  ? Venomil White [employer]aced [facility] [White Faced [place] Venom] Anaphylaxis  ? ? ?Family History  ?Problem Relation Age of Onset  ? Hyperlipidemia Mother   ? Hypertension Mother   ? Stroke Mother   ? ? ?Prior to Admission medications   ?Medication Sig Start Date End Date Taking? Authorizing Provider  ?albuterol (VENTOLIN HFA) 108 (90 Base) MCG/ACT inhaler INHALE 1 TO  2 INHALATIONS  BY MOUTH INTO THE LUNGS  EVERY 4 HOURS AS NEEDED FOR WHEEZING OR SHORTNESS OF  BREATH 05/30/21  Yes Lyndon Code, MD  ?doxycycline (VIBRA-TABS) 100 MG tablet Take 100 mg by mouth 2 (two) times daily. 08/10/21  Yes [provider]  ?ipratropium-albuterol (DUONEB) 0.5-2.5 (3) MG/3ML SOLN USE 1 VIAL VIA NEBULIZER  EVERY 6 HOURS AS NEEDED 03/05/21  Yes Lyndon Code, MD  ?montelukast (SINGULAIR) 10 MG tablet TAKE 1 TABLET BY MOUTH  DAILY 07/20/20  Yes Yevonne Pax, MD  ?omeprazole (PRILOSEC) 20 MG capsule Take 1  capsule (20 mg total) by mouth daily. 09/08/20  Yes Sallyanne Kuster, NP  ?Polyethyl Glycol-Propyl Glycol (LUBRICANT EYE DROPS) 0.4-0.3 % SOLN Place 1-2 drops into both eyes 3 (three) times daily as needed (dry/irritated eyes).   Yes [provider]  ?SPIRIVA HANDIHALER 18 MCG inhalation capsule INHALE THE CONTENTS OF 1  CAPSULE BY MOUTH VIA  HANDIHALER ONCE DAILY 09/07/20  Yes Yevonne Pax, MD  ?[facility] 160-4.5 MCG/ACT inhaler INHALE 2 INHALATIONS BY  MOUTH TWICE DAILY 06/13/21  Yes Yevonne Pax, MD  ?theophylline (UNIPHYL) 400 MG 24 hr tablet TAKE 1 TABLET BY MOUTH  DAILY 05/04/21  Yes Yevonne Pax, MD  ?traMADol (ULTRAM) 50 MG tablet Take 50 mg by mouth every 6 (six) hours as needed. 08/10/21  Yes [provider]  ?EPINEPHrine 0.3 mg/0.3 mL IJ SOAJ injection Inject 0.3 mg into the muscle as needed for anaphylaxis.    [provider]  ?OXYGEN Inhale 5 L into the lungs. At night pt uses [facility]    [provider]  ? ? ?Physical Exam: ?Vitals:  ? 08/17/21 1109 08/17/21 1248  ?BP: 126/87 (!) 167/72  ?Pulse: 78 93  ?Resp: 14   ?Temp: 97.9 ?F (36.6 ?C)   ?TempSrc: Oral   ?SpO2: 96% 100%  ?Weight: 72.6 kg   ?Height: 5\' 2"  (1.575 m)   ? ?Physical Exam ?Vitals and nursing note reviewed.  ?Constitutional:   ?   Appearance: Normal appearance.  ?HENT:  ?   Head: Normocephalic and atraumatic.  ?   Nose: Nose normal.  ?   Mouth/Throat:  ?   Mouth: Mucous membranes are moist.  ?Eyes:  ?   Pupils: Pupils are equal, round, and reactive to light.  ?Cardiovascular:  ?   Rate and Rhythm: Normal rate and regular rhythm.  ?Pulmonary:  ?   Effort: Pulmonary effort is normal.  ?   Breath sounds: Normal breath sounds.  ?   Comments: Scattered expiratory wheezes in both lung fields ?Abdominal:  ?   General: Abdomen is flat. Bowel sounds are normal.  ?   Palpations: Abdomen is soft.  ?Musculoskeletal:     ?   General: Normal range of motion.  ?   Cervical back: Normal range of motion and neck supple.   ?Skin: ?   General: Skin is warm and dry.  ?   Comments: Indurated area over the left areola with surrounding redness extending to the left breast tissue  ?Neurological:  ?   General: No focal deficit present.  ?   Mental Status: She is alert and oriented to person, place, and time.  ?Psychiatric:     ?   Mood and Affect: Mood normal.     ?   Behavior: Behavior normal.  ? ? ?Data Reviewed: ?Relevant notes from primary care and specialist visits, past discharge summaries as available in EHR, including Care Everywhere. ?Prior  diagnostic testing as pertinent to current admission diagnoses ?Updated medications and problem lists for reconciliation ?ED course, including vitals, labs, imaging, treatment and response to treatment ?Triage notes, nursing and pharmacy notes and ED provider's notes ?Notable results as noted in HPI ?Labs reviewed and essentially negative ?There are no new results to review at this time. ? ?Assessment and Plan: ?* Cellulitis of left breast ?Patient presents to the ER for evaluation of worsening cellulitis and abscess involving the left breast. ?She has failed outpatient antibiotic therapy and has been on doxycycline for 1 week without any improvement in her symptoms ?We will place patient on Ancef 1 g IV every 8 ?Consult surgery for possible I&D ? ?Gastro-esophageal reflux disease without esophagitis ?Stable ?Continue PPI ? ?Current tobacco use ?Patient continues to smoke but states that she has cut down to 3 cigarettes a day ?Smoking cessation discussed with her in detail  ?She declines a nicotine transdermal patch at this time ? ?Chronic respiratory failure (HCC) ?Continue oxygen supplementation at 3 L and pulse oximetry greater than 92% ? ?Chronic obstructive pulmonary disease (HCC) ?Stable and not acutely exacerbated ?Continue as needed bronchodilator therapy as well as inhaled steroids ?Continue theophylline ? ? ? ? ? Advance Care Planning:   Code Status: Full Code  ? ?Consults:  Surgery ? ?Family Communication: Greater than 50% of time was spent discussing patient's condition and plan of care with her at the bedside.  All questions and concerns have been addressed.  She verbalizes understand

## 2021-08-18 ENCOUNTER — Encounter: Payer: Self-pay | Admitting: Surgery

## 2021-08-18 DIAGNOSIS — N61 Mastitis without abscess: Secondary | ICD-10-CM | POA: Diagnosis not present

## 2021-08-18 LAB — BASIC METABOLIC PANEL
Anion gap: 8 (ref 5–15)
BUN: 9 mg/dL (ref 6–20)
CO2: 32 mmol/L (ref 22–32)
Calcium: 9.4 mg/dL (ref 8.9–10.3)
Chloride: 95 mmol/L — ABNORMAL LOW (ref 98–111)
Creatinine, Ser: 0.72 mg/dL (ref 0.44–1.00)
GFR, Estimated: >60 mL/min (ref >60)
Glucose, Bld: 95 mg/dL (ref 70–99)
Potassium: 4.3 mmol/L (ref 3.5–5.1)
Sodium: 135 mmol/L (ref 135–145)

## 2021-08-18 LAB — CBC
HCT: 38.9 % (ref 36.0–46.0)
Hemoglobin: 12.2 g/dL (ref 12.0–15.0)
MCH: 29.9 pg (ref 26.0–34.0)
MCHC: 31.4 g/dL (ref 30.0–36.0)
MCV: 95.3 fL (ref 80.0–100.0)
Platelets: 254 10*3/uL (ref 150–400)
RBC: 4.08 MIL/uL (ref 3.87–5.11)
RDW: 11.7 % (ref 11.5–15.5)
WBC: 8.1 10*3/uL (ref 4.0–10.5)
nRBC: 0 % (ref 0.0–0.2)

## 2021-08-18 LAB — HIV ANTIBODY (ROUTINE TESTING W REFLEX): HIV Screen 4th Generation wRfx: NONREACTIVE

## 2021-08-18 MED ORDER — LORATADINE 10 MG PO TABS
10.0000 mg | ORAL_TABLET | Freq: Every day | ORAL | Status: DC
  Administered 2021-08-18 – 2021-08-19 (×2): 10 mg via ORAL
  Filled 2021-08-18 (×2): qty 1

## 2021-08-18 MED ORDER — FLUTICASONE PROPIONATE 50 MCG/ACT NA SUSP
2.0000 | Freq: Every day | NASAL | Status: DC
  Administered 2021-08-18 – 2021-08-19 (×2): 2 via NASAL
  Filled 2021-08-18: qty 16

## 2021-08-18 MED ORDER — VANCOMYCIN HCL 1500 MG/300ML IV SOLN
1500.0000 mg | INTRAVENOUS | Status: DC
  Administered 2021-08-18: 1500 mg via INTRAVENOUS
  Filled 2021-08-18 (×2): qty 300

## 2021-08-18 MED ORDER — ENOXAPARIN SODIUM 40 MG/0.4ML IJ SOSY: 40.0000 mg | PREFILLED_SYRINGE | INTRAMUSCULAR | Status: DC

## 2021-08-18 NOTE — Progress Notes (Signed)
?PROGRESS NOTE ? ? ? ?[name]  [id number] DOB: 09/13/1960 DOA: 08/17/2021 ?PCP: Glennie Hawk, MD  ? ? ?Brief Narrative:  ? 61 y.o. female with medical history significant for COPD, chronic respiratory failure on 3 L of oxygen continuous, nicotine dependence, GERD who presents to the ER for evaluation of worsening left breast redness and swelling. ?Her symptoms started about 2 weeks ago.  She was seen at the walk-in clinic on 04/27 and prescribed doxycycline 100 mg twice daily for 10 days.  She was also advised to apply warm compress 3 times daily.  Patient has taken the antibiotic for about a week and notices worsening redness and swelling involving the left areolar and extending to the left breast tissue.  She has had no drainage from her left breast.  She denies having any fever or chills.  No known history of breast cancer or family history of same.  Last mammogram was about 4 years ago. ? ?Patient underwent I&D of left breast abscess with general surgery.  Case discussed with Dr. Hampton Abbot.  Patient tolerated procedure well.  Large amount of purulent material liberated in OR.  Dressing change to be done by general surgery on postop day #1. ? ? ?Assessment & Plan: ?  ?Principal Problem: ?  Cellulitis of left breast ?Active Problems: ?  Gastro-esophageal reflux disease without esophagitis ?  Current tobacco use ?  Chronic obstructive pulmonary disease (HCC) ?  Chronic respiratory failure ([place]) ?  Abscess of left breast ? ?Left breast abscess ?Status post I&D 5/4 ?Tolerated procedure well ?Postoperative pain well controlled ?Plan: ?Dressing change today ?IV antibiotics ?Await culture data ?Daily dressing changes ?Okay for diet ? ?Gastro-esophageal reflux disease without esophagitis ?Stable ?Continue PPI ?  ?Current tobacco use ?Patient continues to smoke but states that she has cut down to 3 cigarettes a day ?Smoking cessation discussed with her in detail  ?She declines a nicotine transdermal patch at this  time ?  ?Chronic respiratory failure ([place]) ?Currently on 3 L rate, which is baseline ?  ?Chronic obstructive pulmonary disease (HCC) ?Stable and not acutely exacerbated ?Continue as needed bronchodilator therapy as well as inhaled steroids ?Continue theophylline ? ?DVT prophylaxis: SQ Lovenox ?Code Status: Full ?Family Communication: None today.  Offered to call but the patient declined ?Disposition Plan: Status is: Inpatient ?Remains inpatient appropriate because: Left breast abscess status post I&D.  Remains on IV antibiotics.  Awaiting culture data.  Anticipate discharge in 24 to 48 hours. ? ? ?Level of care: Med-Surg ? ?Consultants:  ?General surgery ? ?Procedures:  ?Left breast I&D 5/4 ? ?Antimicrobials: ?Vancomycin ? ? ?Subjective: ?Seen and examined.  Resting comfortably bed.  No visible distress.  No pain complaints. ? ?Objective: ?Vitals:  ? 08/17/21 2024 08/17/21 2330 08/18/21 0403 08/18/21 [id number]  ?BP: (!) 155/67 (!) 130/50 (!) 152/75 (!) 160/66  ?Pulse: (!) 104 81 94 (!) 102  ?Resp: 20 19 18 18   ?Temp: 97.8 ?F (36.6 ?C) 97.8 ?F (36.6 ?C) 98.5 ?F (36.9 ?C) 98.2 ?F (36.8 ?C)  ?TempSrc:  Oral  Oral  ?SpO2: 100% 100% 99% 100%  ?Weight:      ?Height:      ? ? ?Intake/Output Summary (Last 24 hours) at 08/18/2021 1239 ?Last data filed at 08/18/2021 0559 ?Gross per 24 hour  ?Intake 775 ml  ?Output --  ?Net 775 ml  ? ?Filed Weights  ? 08/17/21 1109 08/17/21 1715  ?Weight: 72.6 kg 72.6 kg  ? ? ?Examination: ? ?General exam: Appears calm  and comfortable  ?Respiratory system: Diminished breath sounds.  Scattered crackles bilaterally.  Normal work of breathing.  3 L ?Cardiovascular system: S1-S2, RRR, no murmurs, no pedal edema ?Gastrointestinal system: Soft, NT/ND, normal bowel sounds ?Central nervous system: Alert and oriented. No focal neurological deficits. ?Extremities: Symmetric 5 x 5 power. ?Skin: Left chest surgical incision CDI ?Psychiatry: Judgement and insight appear normal. Mood & affect appropriate.   ? ? ? ?Data Reviewed: I have personally reviewed following labs and imaging studies ? ?CBC: ?Recent Labs  ?Lab 08/17/21 ?1152 08/18/21 ?0433  ?WBC 8.4 8.1  ?NEUTROABS 5.8  --   ?HGB 12.5 12.2  ?HCT 38.7 38.9  ?MCV 94.4 95.3  ?PLT 261 254  ? ?Basic Metabolic Panel: ?Recent Labs  ?Lab 08/17/21 ?1152 08/18/21 ?0433  ?NA 135 135  ?K 4.4 4.3  ?CL 96* 95*  ?CO2 30 32  ?GLUCOSE 95 95  ?BUN 6 9  ?CREATININE 0.65 0.72  ?CALCIUM 9.4 9.4  ? ?GFR: ?Estimated Creatinine Clearance: 69.8 mL/min (by C-G formula based on SCr of 0.72 mg/dL). ?Liver Function Tests: ?Recent Labs  ?Lab 08/17/21 ?1152  ?AST 14*  ?ALT 11  ?ALKPHOS 74  ?BILITOT 0.6  ?PROT 7.3  ?ALBUMIN 3.8  ? ?No results for input(s): LIPASE, AMYLASE in the last 168 hours. ?No results for input(s): AMMONIA in the last 168 hours. ?Coagulation Profile: ?No results for input(s): INR, PROTIME in the last 168 hours. ?Cardiac Enzymes: ?No results for input(s): CKTOTAL, CKMB, CKMBINDEX, TROPONINI in the last 168 hours. ?BNP (last 3 results) ?No results for input(s): PROBNP in the last 8760 hours. ?HbA1C: ?No results for input(s): HGBA1C in the last 72 hours. ?CBG: ?No results for input(s): GLUCAP in the last 168 hours. ?Lipid Profile: ?No results for input(s): CHOL, HDL, LDLCALC, TRIG, CHOLHDL, LDLDIRECT in the last 72 hours. ?Thyroid Function Tests: ?No results for input(s): TSH, T4TOTAL, FREET4, T3FREE, THYROIDAB in the last 72 hours. ?Anemia Panel: ?No results for input(s): VITAMINB12, FOLATE, FERRITIN, TIBC, IRON, RETICCTPCT in the last 72 hours. ?Sepsis Labs: ?Recent Labs  ?Lab 08/17/21 ?1215  ?LATICACIDVEN 1.4  ? ? ?Recent Results (from the past 240 hour(s))  ?Blood culture (routine x 2)     Status: None (Preliminary result)  ? Collection Time: 08/17/21 12:15 PM  ? Specimen: BLOOD  ?Result Value Ref Range Status  ? Specimen Description BLOOD BLOOD RIGHT HAND  Final  ? Special Requests   Final  ?  BOTTLES DRAWN AEROBIC AND ANAEROBIC Blood Culture adequate volume  ? Culture    Final  ?  NO GROWTH < 24 HOURS ?Performed at [facility], [address], [place], [place] [address] ?  ? Report Status PENDING  Incomplete  ?Blood culture (routine x 2)     Status: None (Preliminary result)  ? Collection Time: 08/17/21 12:16 PM  ? Specimen: BLOOD  ?Result Value Ref Range Status  ? Specimen Description BLOOD LEFT ANTECUBITAL  Final  ? Special Requests   Final  ?  BOTTLES DRAWN AEROBIC AND ANAEROBIC Blood Culture adequate volume  ? Culture   Final  ?  NO GROWTH < 24 HOURS ?Performed at [facility], [address] [place], [place] [address] ?  ? Report Status PENDING  Incomplete  ?Aerobic/Anaerobic Culture w Gram Stain (surgical/deep wound)     Status: None (Preliminary result)  ? Collection Time: 08/17/21  6:49 PM  ? Specimen: PATH Breast other; Tissue  ?Result Value Ref Range Status  ? Specimen Description   Final  ?  TISSUE ?Performed at [facility], [address], [place], [place] [address] ?  ? Special Requests   Final  ?  NONE LEFT BREAST ?Performed at [facility], [address], [place], [place] [address] ?  ? Gram Stain   Final  ?  NO SQUAMOUS EPITHELIAL CELLS SEEN ?FEW WBC SEEN ?ABUNDANT GRAM POSITIVE COCCI ?  ? Culture   Final  ?  NO GROWTH < 12 HOURS ?Performed at [facility], [place] [address], [place], [place] [address] ?  ? Report Status PENDING  Incomplete  ?  ? ? ? ? ? ?Radiology Studies: ?[place] OR NERVE BLOCK-IMAGE ONLY [facility]) ? ?Result Date: 08/17/2021 ?There is no interpretation for this exam.  This order is for images obtained during a surgical procedure.  Please See "Surgeries" Tab for more information regarding the procedure.  ? ?US BREAST LTD UNI LEFT INC AXILLA ? ?Result Date: 08/17/2021 ?CLINICAL DATA:  61 year old female presenting for worsening left breast swelling and redness for approximately 1 week. Patient was placed on antibiotics approximately 1 week ago. EXAM: ULTRASOUND OF THE LEFT BREAST COMPARISON:  None Available.  FINDINGS: Targeted ultrasound is performed at the site of concern in the left breast at 3 o'clock retroareolar demonstrating a heterogeneous mass/collection to the nipple that is within the periareolar skin. The c

## 2021-08-18 NOTE — Progress Notes (Signed)
08/18/2021 ? ?Subjective: ?Patient is 1 Day Post-Op status post I&D of left breast abscess.  No acute events overnight.  Patient reports that she is doing well denies any worsening pain.  She reports that she slept well.  WBC is normal today at 8.1.  Preliminary or culture showing abundant gram-positive cocci. ? ?Vital signs: ?Temp:  [97.4 ?F (36.3 ?C)-98.5 ?F (36.9 ?C)] 98.2 ?F (36.8 ?C) (05/05 [id number]) ?Pulse Rate:  [75-123] 102 (05/05 0728) ?Resp:  [14-24] 18 (05/05 0728) ?BP: (106-167)/(50-93) 160/66 (05/05 0728) ?SpO2:  [96 %-100 %] 100 % (05/05 0728) ?Weight:  [72.6 kg] 72.6 kg (05/04 1715)  ? ?Intake/Output: ?05/04 0701 - 05/05 0700 ?In: [id number] [P.O.:175; I.V.:300; IV Piggyback:300] ?Out: -  ?Last BM Date : 08/16/21 ? ?Physical Exam: ?Constitutional: No acute distress ?Breast: Left breast status post I&D at the 3 o'clock position in the periareolar edge.  Half-inch iodoform gauze was removed and new piece packed in.  Cavity had clean edges without any purulence or necrosis.  Erythema and induration vastly improved.  Patient tolerated dressing well.  Dry gauze dressing and breast binder applied. ? ?Labs:  ?Recent Labs  ?  08/17/21 ?1152 08/18/21 ?0433  ?WBC 8.4 8.1  ?HGB 12.5 12.2  ?HCT 38.7 38.9  ?PLT 261 254  ? ?Recent Labs  ?  08/17/21 ?1152 08/18/21 ?0433  ?NA 135 135  ?K 4.4 4.3  ?CL 96* 95*  ?CO2 30 32  ?GLUCOSE 95 95  ?BUN 6 9  ?CREATININE 0.65 0.72  ?CALCIUM 9.4 9.4  ? ?No results for input(s): LABPROT, INR in the last 72 hours. ? ?Imaging: ?[place] OR NERVE BLOCK-IMAGE ONLY [facility]) ? ?Result Date: 08/17/2021 ?There is no interpretation for this exam.  This order is for images obtained during a surgical procedure.  Please See "Surgeries" Tab for more information regarding the procedure.  ? ?US BREAST LTD UNI LEFT INC AXILLA ? ?Result Date: 08/17/2021 ?CLINICAL DATA:  61 year old female presenting for worsening left breast swelling and redness for approximately 1 week. Patient was placed on antibiotics approximately 1  week ago. EXAM: ULTRASOUND OF THE LEFT BREAST COMPARISON:  None Available. FINDINGS: Targeted ultrasound is performed at the site of concern in the left breast at 3 o'clock retroareolar demonstrating a heterogeneous mass/collection to the nipple that is within the periareolar skin. The collection measures 4.5 x 1.6 x 3.6 cm. There is increased vascularity in the surrounding tissues likely representing inflammation. There is no mass or collection extending into the deeper sub adjacent breast tissue. IMPRESSION: Superficial heterogeneous mass/collection in the retroareolar left breast measuring up to 4.5 cm. Given clinical history this is favored to represent an abscess. RECOMMENDATION: 1. Consider incision and drainage of the superficial left breast collection. 2.  Defer antibiotic therapy to clinical team. 3. Follow-up exam in a dedicated breast center in 1-2 weeks. Patient also needs a bilateral diagnostic mammogram. I have discussed the findings and recommendations with the patient. If applicable, a reminder letter will be sent to the patient regarding the next appointment. BI-RADS CATEGORY  3: Probably benign. Electronically Signed   By: Audie Pinto M.D.   On: 08/17/2021 14:03   ? ?Assessment/Plan: ?This is a 61 y.o. female s/p I&D of left breast abscess. ? ?- Perform dressing change today with no complications.  Patient did have some pain but tolerated well.  Cavity appears healthy without any necrotic tissue. ?- Continue IV antibiotics for now while awaiting culture data results. ?- Daily dressing changes. ?-Advance diet as tolerated. ?-  We will continue to follow with you. ? ? ?Melvyn Neth, MD ?[facility]  ?

## 2021-08-18 NOTE — Progress Notes (Signed)
Pharmacy Antibiotic Note ? ?Kristi Walter is a 61 y.o. female admitted on 08/17/2021 with cellulitis for left breast. Pt was on doxycyline prior to admission for one week with no improvement. Pt received one time dose of vancomycin and ceftriaxone and was switched to cefazolin. Pt underwent I&D of breast abscess 5/4; culture with abundant GPC. Pharmacy has been consulted for vancomycin dosing. ? ?Plan: ?Pt received vancomycin 1 g IV x 1 on 5/4 in ED. Will start maintenance dose of 1500 mg IV Q24H ?Est AUC: 536.5 ?Used: Scr 0.8 (rounded up), IBW, Vd 0.72 ?Obtain vanc levels around 4th or 5th dose if continued ?Monitor renal function and adjust dose as clinically indicated ? ?Height: 5\' 2"  (157.5 cm) ?Weight: 72.6 kg (160 lb 0.9 oz) ?IBW/kg (Calculated) : 50.1 ? ?Temp (24hrs), Avg:97.9 ?F (36.6 ?C), Min:97.4 ?F (36.3 ?C), Max:98.5 ?F (36.9 ?C) ? ?Recent Labs  ?Lab 08/17/21 ?1152 08/17/21 ?1215 08/18/21 ?0433  ?WBC 8.4  --  8.1  ?CREATININE 0.65  --  0.72  ?LATICACIDVEN  --  1.4  --   ?  ?Estimated Creatinine Clearance: 69.8 mL/min (by C-G formula based on SCr of 0.72 mg/dL).   ? ?Allergies  ?Allergen Reactions  ? Bee Venom Anaphylaxis  ? Eggs Or Egg-Derived Products Anaphylaxis  ? Peanut Oil Anaphylaxis  ? Tomato Anaphylaxis  ? Venomil Honey Bee Venom [Honey Bee Venom] Anaphylaxis  ? Venomil Wasp Venom [Wasp Venom] Anaphylaxis  ? Venomil [employer] [White Faced [place] Venom] Anaphylaxis  ? ? ?Antimicrobials this admission: ?5/4 ceftriaxone x 1 ?5/4 cefazolin >> 5/5 ?5/4 vancomycin >>  ? ?Dose adjustments this admission: ? ? ?Microbiology results: ?5/4 BCx: NG <24H ?5/4 L breast: GPC ? ?Thank you for allowing pharmacy to be a part of this patient?s care. ? ?Forde Dandy Micheala Morissette ?08/18/2021 10:02 AM ? ?

## 2021-08-18 NOTE — Plan of Care (Signed)

## 2021-08-18 NOTE — Anesthesia Postprocedure Evaluation (Signed)
Anesthesia Post Note ? ?Patient: Kristi Walter ? ?Procedure(s) Performed: IRRIGATION AND DEBRIDEMENT LEFT BREAST ABSCESS (Left) ? ?Patient location during evaluation: PACU ?Anesthesia Type: General and Regional ?Level of consciousness: awake and alert ?Pain management: pain level controlled ?Vital Signs Assessment: post-procedure vital signs reviewed and stable ?Respiratory status: spontaneous breathing, nonlabored ventilation, respiratory function stable and patient connected to nasal cannula oxygen ?Cardiovascular status: blood pressure returned to baseline and stable ?Postop Assessment: no apparent nausea or vomiting ?Anesthetic complications: no ? ? ?No notable events documented. ? ? ?Last Vitals:  ?Vitals:  ? 08/17/21 2024 08/17/21 2330  ?BP: (!) 155/67 (!) 130/50  ?Pulse: (!) 104 81  ?Resp: 20 19  ?Temp: 36.6 ?C 36.6 ?C  ?SpO2: 100% 100%  ?  ?Last Pain:  ?Vitals:  ? 08/17/21 2330  ?TempSrc: Oral  ?PainSc:   ? ? ?  ?  ?  ?  ?  ?  ? ?Lenard Simmer ? ? ? ? ?

## 2021-08-19 DIAGNOSIS — N61 Mastitis without abscess: Secondary | ICD-10-CM | POA: Diagnosis not present

## 2021-08-19 LAB — CREATININE, SERUM
Creatinine, Ser: 0.63 mg/dL (ref 0.44–1.00)
GFR, Estimated: >60 mL/min (ref >60)

## 2021-08-19 MED ORDER — HYDROCODONE-ACETAMINOPHEN 5-325 MG PO TABS: 1.0000 | ORAL_TABLET | ORAL | 0 refills | Status: AC | PRN

## 2021-08-19 MED ORDER — ALBUTEROL SULFATE (2.5 MG/3ML) 0.083% IN NEBU
INHALATION_SOLUTION | RESPIRATORY_TRACT | Status: AC
  Filled 2021-08-19: qty 3

## 2021-08-19 MED ORDER — DOXYCYCLINE HYCLATE 100 MG PO TABS: 100.0000 mg | ORAL_TABLET | Freq: Two times a day (BID) | ORAL | 0 refills | Status: DC

## 2021-08-19 MED ORDER — ALBUTEROL SULFATE (2.5 MG/3ML) 0.083% IN NEBU
2.5000 mg | INHALATION_SOLUTION | Freq: Four times a day (QID) | RESPIRATORY_TRACT | Status: DC
  Administered 2021-08-19: 2.5 mg via RESPIRATORY_TRACT

## 2021-08-19 NOTE — Discharge Instructions (Signed)
Skin Abscess ?Close-up of an abscess on the skin. ? ?A skin abscess is an infected area of your skin that contains pus and other material. An abscess can happen in any part of your body. Some abscesses break open (rupture) on their own. Most continue to get worse unless they are treated. The infection can spread deeper into the body and into your blood, which can make you feel sick. ?A skin abscess is caused by germs that enter the skin through a cut or scrape. It can also be caused by blocked oil and sweat glands or infected hair follicles. ?This condition is usually treated by: ?Draining the pus. ?Taking antibiotic medicines. ?Placing a warm, wet washcloth over the abscess. ?Follow these instructions at home: ?Medicines ?A prescription pill bottle with an example of a pill. ? ?Take over-the-counter and prescription medicines only as told by your doctor. ?If you were prescribed an antibiotic medicine, take it as told by your doctor. Do not stop taking the antibiotic even if you start to feel better. ?Abscess care ?Washing hands with soap and water. ? ?If you have an abscess that has not drained, place a warm, clean, wet washcloth over the abscess several times a day. Do this as told by your doctor. ?Follow instructions from your doctor about how to take care of your abscess. Make sure you: ?Cover the abscess with a bandage (dressing). ?Change your bandage or gauze as told by your doctor. ?Wash your hands with soap and water before you change the bandage or gauze. If you cannot use soap and water, use hand sanitizer. ?Check your abscess every day for signs that the infection is getting worse. Check for: ?More redness, swelling, or pain. ?More fluid or blood. ?Warmth. ?More pus or a bad smell. ?General instructions ?To avoid spreading the infection: ?Do not share personal care items, towels, or hot tubs with others. ?Avoid making skin-to-skin contact with other people. ?Keep all follow-up visits as told by your  doctor. This is important. ?Contact a doctor if: ?You have more redness, swelling, or pain around your abscess. ?You have more fluid or blood coming from your abscess. ?Your abscess feels warm when you touch it. ?You have more pus or a bad smell coming from your abscess. ?Your muscles ache. ?You feel sick. ?Get help right away if: ?You have very bad (severe) pain. ?You see red streaks on your skin spreading away from the abscess. ?You see redness that spreads quickly. ?You have a fever or chills. ?Summary ?A skin abscess is an infected area of your skin that contains pus and other material. ?The abscess is caused by germs that enter the skin through a cut or scrape. It can also be caused by blocked oil and sweat glands or infected hair follicles. ?Follow your doctor's instructions on caring for your abscess, taking medicines, preventing infections, and keeping follow-up visits. ?This information is not intended to replace advice given to you by your health care provider. Make sure you discuss any questions you have with your health care provider. ?Document Revised: 01/09/2021 Document Reviewed: 01/09/2021 ?Elsevier Patient Education ? [place]. ?   ? ?

## 2021-08-19 NOTE — Progress Notes (Signed)
Patient has refused Lovenox injection, patient educated. SCDs on, patient also ambulates in room independently.  ?

## 2021-08-19 NOTE — Plan of Care (Signed)
  Problem: Pain Managment: Goal: General experience of comfort will improve Outcome: Progressing   Problem: Safety: Goal: Ability to remain free from injury will improve Outcome: Progressing   Problem: Activity: Goal: Risk for activity intolerance will decrease Outcome: Progressing   

## 2021-08-19 NOTE — Discharge Summary (Signed)
Physician Discharge Summary  ?Kristi Walter UJW:[id number] DOB: 30-Jul-1960 DOA: 08/17/2021 ? ?PCP: Bernerd Limbo, MD ? ?Admit date: 08/17/2021 ?Discharge date: 08/19/2021 ? ?Admitted From: Home ?Disposition: Home ? ?Recommendations for Outpatient Follow-up:  ?Follow up with PCP in 1-2 weeks ?Follow-up with general surgery as directed ? ?Home Health: No ?Equipment/Devices: None ? ?Discharge Condition: Stable ?CODE STATUS: Full ?Diet recommendation: Regular ? ?Brief/Interim Summary: ? 61 y.o. female with medical history significant for COPD, chronic respiratory failure on 3 L of oxygen continuous, nicotine dependence, GERD who presents to the ER for evaluation of worsening left breast redness and swelling. ?Her symptoms started about 2 weeks ago.  She was seen at the walk-in clinic on 04/27 and prescribed doxycycline 100 mg twice daily for 10 days.  She was also advised to apply warm compress 3 times daily.  Patient has taken the antibiotic for about a week and notices worsening redness and swelling involving the left areolar and extending to the left breast tissue.  She has had no drainage from her left breast.  She denies having any fever or chills.  No known history of breast cancer or family history of same.  Last mammogram was about 4 years ago. ?  ?Patient underwent I&D of left breast abscess with general surgery.  Case discussed with Dr. Aleen Campi.  Patient tolerated procedure well.  Large amount of purulent material liberated in OR.  Dressing change done on postoperative day #1 ? ?Discharge plan discussed with general surgery on postoperative day #2.  Stable for discharge from their standpoint.  Patient hemodynamically stable.  Still pending final culture data.  Will transition to doxycycline at time of discharge.  Prescription sent to outpatient pharmacy.  As needed pain management also sent.  Follow-up outpatient PCP and general surgery as directed. ? ? ? ?Discharge Diagnoses:  ?Principal Problem: ?  Cellulitis of  left breast ?Active Problems: ?  Gastro-esophageal reflux disease without esophagitis ?  Current tobacco use ?  Chronic obstructive pulmonary disease (HCC) ?  Chronic respiratory failure (HCC) ?  Abscess of left breast ? ?Left breast abscess ?Status post I&D 5/4 ?Tolerated procedure well ?Postoperative pain well controlled ?Dressing changed on 5/5 ?Wound culture with no growth to date ?Plan: ?Discussed with general surgery.  Wound is reassuring.  Cleared for discharge at this time.  Will transition to oral doxycycline.  Prescription sent for antibiotics and as needed pain management to outpatient pharmacy.  Follow-up outpatient PCP and surgery as directed ? ?Discharge Instructions ? ?Discharge Instructions   ? ? Diet - low sodium heart healthy   Complete by: As directed ?  ? Increase activity slowly   Complete by: As directed ?  ? No wound care   Complete by: As directed ?  ? ?  ? ?Allergies as of 08/19/2021   ? ?   Reactions  ? Bee Venom Anaphylaxis  ? Eggs Or Egg-derived Products Anaphylaxis  ? Peanut Oil Anaphylaxis  ? Tomato Anaphylaxis  ? Venomil Honey Bee Venom [honey Bee Venom] Anaphylaxis  ? Venomil Wasp Venom [wasp Venom] Anaphylaxis  ? Venomil White Faced [employer] Faced Hornet Venom] Anaphylaxis  ? ?  ? ?  ?Medication List  ?  ? ?STOP taking these medications   ? ?traMADol 50 MG tablet ?Commonly known as: ULTRAM ?  ? ?  ? ?TAKE these medications   ? ?albuterol 108 (90 Base) MCG/ACT inhaler ?Commonly known as: VENTOLIN HFA ?INHALE 1 TO 2 INHALATIONS  BY MOUTH INTO THE LUNGS  EVERY 4 HOURS AS NEEDED FOR WHEEZING OR SHORTNESS OF  BREATH ?  ?doxycycline 100 MG tablet ?Commonly known as: VIBRA-TABS ?Take 1 tablet (100 mg total) by mouth 2 (two) times daily for 7 days. ?  ?EPINEPHrine 0.3 mg/0.3 mL Soaj injection ?Commonly known as: EPI-PEN ?Inject 0.3 mg into the muscle as needed for anaphylaxis. ?  ?HYDROcodone-acetaminophen 5-325 MG tablet ?Commonly known as: NORCO/VICODIN ?Take 1 tablet by mouth every 4  (four) hours as needed for moderate pain. ?  ?ipratropium-albuterol 0.5-2.5 (3) MG/3ML Soln ?Commonly known as: DUONEB ?USE 1 VIAL VIA NEBULIZER  EVERY 6 HOURS AS NEEDED ?  ?Lubricant Eye Drops 0.4-0.3 % Soln ?Generic drug: Polyethyl Glycol-Propyl Glycol ?Place 1-2 drops into both eyes 3 (three) times daily as needed (dry/irritated eyes). ?  ?montelukast 10 MG tablet ?Commonly known as: SINGULAIR ?TAKE 1 TABLET BY MOUTH  DAILY ?  ?omeprazole 20 MG capsule ?Commonly known as: PRILOSEC ?Take 1 capsule (20 mg total) by mouth daily. ?  ?OXYGEN ?Inhale 5 L into the lungs. At night pt uses LINCARE ?  ?Spiriva HandiHaler 18 MCG inhalation capsule ?Generic drug: tiotropium ?INHALE THE CONTENTS OF 1  CAPSULE BY MOUTH VIA  HANDIHALER ONCE DAILY ?  ?Symbicort 160-4.5 MCG/ACT inhaler ?Generic drug: budesonide-formoterol ?INHALE 2 INHALATIONS BY  MOUTH TWICE DAILY ?  ?theophylline 400 MG 24 hr tablet ?Commonly known as: UNIPHYL ?TAKE 1 TABLET BY MOUTH  DAILY ?  ? ?  ? ? Follow-up Information   ? ? Donovan KailSchulz, Zachary R, PA-C Follow up on 08/24/2021.   ?Specialty: Physician Assistant ?Contact information: ?[address] ?[address] ?[place] [address] ?[phone] ? ? ?  ?  ? ?  ?  ? ?  ? ?Allergies  ?Allergen Reactions  ? Bee Venom Anaphylaxis  ? Eggs Or Egg-Derived Products Anaphylaxis  ? Peanut Oil Anaphylaxis  ? Tomato Anaphylaxis  ? Venomil Honey Bee Venom [Honey Bee Venom] Anaphylaxis  ? Venomil Wasp Venom [Wasp Venom] Anaphylaxis  ? Venomil White Faced [employer]et [White Faced Hornet Venom] Anaphylaxis  ? ? ?Consultations: ?General surgery ? ? ?Procedures/Studies: ?US OR NERVE BLOCK-IMAGE ONLY [facility]) ? ?Result Date: 08/17/2021 ?There is no interpretation for this exam.  This order is for images obtained during a surgical procedure.  Please See "Surgeries" Tab for more information regarding the procedure.  ? ?US BREAST LTD UNI LEFT INC AXILLA ? ?Result Date: 08/17/2021 ?CLINICAL DATA:  61 year old female presenting for worsening left  breast swelling and redness for approximately 1 week. Patient was placed on antibiotics approximately 1 week ago. EXAM: ULTRASOUND OF THE LEFT BREAST COMPARISON:  None Available. FINDINGS: Targeted ultrasound is performed at the site of concern in the left breast at 3 o'clock retroareolar demonstrating a heterogeneous mass/collection to the nipple that is within the periareolar skin. The collection measures 4.5 x 1.6 x 3.6 cm. There is increased vascularity in the surrounding tissues likely representing inflammation. There is no mass or collection extending into the deeper sub adjacent breast tissue. IMPRESSION: Superficial heterogeneous mass/collection in the retroareolar left breast measuring up to 4.5 cm. Given clinical history this is favored to represent an abscess. RECOMMENDATION: 1. Consider incision and drainage of the superficial left breast collection. 2.  Defer antibiotic therapy to clinical team. 3. Follow-up exam in a dedicated breast center in 1-2 weeks. Patient also needs a bilateral diagnostic mammogram. I have discussed the findings and recommendations with the patient. If applicable, a reminder letter will be sent to the patient regarding the next appointment. BI-RADS  CATEGORY  3: Probably benign. Electronically Signed   By: Emmaline Kluver M.D.   On: 08/17/2021 14:03   ? ? ? ?Subjective: ?Seen and examined on day of discharge.  Stable no distress.  Stable for discharge home ? ?Discharge Exam: ?Vitals:  ? 08/19/21 0819 08/19/21 1209  ?BP: (!) 163/53 (!) 122/49  ?Pulse: 84 74  ?Resp: 20 19  ?Temp: 99 ?F (37.2 ?C) 98.2 ?F (36.8 ?C)  ?SpO2: 98% 99%  ? ?Vitals:  ? 08/18/21 2115 08/19/21 0537 08/19/21 0819 08/19/21 1209  ?BP:  (!) 127/53 (!) 163/53 (!) 122/49  ?Pulse:  67 84 74  ?Resp:  20 20 19   ?Temp:  98.2 ?F (36.8 ?C) 99 ?F (37.2 ?C) 98.2 ?F (36.8 ?C)  ?TempSrc:  Oral Oral Oral  ?SpO2: 100% 100% 98% 99%  ?Weight:      ?Height:      ? ? ?General: Pt is alert, awake, not in acute  distress ?Cardiovascular: RRR, S1/S2 +, no rubs, no gallops ?Respiratory: CTA bilaterally, no wheezing, no rhonchi ?Abdominal: Soft, NT, ND, bowel sounds + ?Extremities: no edema, no cyanosis ? ? ? ?The results of signifi

## 2021-08-19 NOTE — Progress Notes (Deleted)
?PROGRESS NOTE ? ? ? ?Kristi Walter  AJH:[id number] DOB: 08-16-1960 DOA: 08/17/2021 ?PCP: Bernerd Limbo, MD  ? ? ?Brief Narrative:  ? 61 y.o. female with medical history significant for COPD, chronic respiratory failure on 3 L of oxygen continuous, nicotine dependence, GERD who presents to the ER for evaluation of worsening left breast redness and swelling. ?Her symptoms started about 2 weeks ago.  She was seen at the walk-in clinic on 04/27 and prescribed doxycycline 100 mg twice daily for 10 days.  She was also advised to apply warm compress 3 times daily.  Patient has taken the antibiotic for about a week and notices worsening redness and swelling involving the left areolar and extending to the left breast tissue.  She has had no drainage from her left breast.  She denies having any fever or chills.  No known history of breast cancer or family history of same.  Last mammogram was about 4 years ago. ? ?Patient underwent I&D of left breast abscess with general surgery.  Case discussed with Dr. Aleen Campi.  Patient tolerated procedure well.  Large amount of purulent material liberated in OR.  Dressing change done on postoperative day #1 ? ? ?Assessment & Plan: ?  ?Principal Problem: ?  Cellulitis of left breast ?Active Problems: ?  Gastro-esophageal reflux disease without esophagitis ?  Current tobacco use ?  Chronic obstructive pulmonary disease (HCC) ?  Chronic respiratory failure (HCC) ?  Abscess of left breast ? ?Left breast abscess ?Status post I&D 5/4 ?Tolerated procedure well ?Postoperative pain well controlled ?Dressing changed on 5/5 ?Wound culture with no growth to date ?Plan: ?Continue IV vancomycin ?Monitor culture data ?General surgery follow-up ?Diet as tolerated ?As needed pain control ?Anticipate discharge 5/7 ? ?Gastro-esophageal reflux disease without esophagitis ?Stable ?Continue PPI ?  ?Current tobacco use ?Patient continues to smoke but states that she has cut down to 3 cigarettes a day ?Smoking  cessation discussed with her in detail  ?She declines a nicotine transdermal patch at this time ?  ?Chronic respiratory failure (HCC) ?Currently on 3 L rate, which is baseline ?  ?Chronic obstructive pulmonary disease (HCC) ?Stable and not acutely exacerbated ?Continue as needed bronchodilator therapy as well as inhaled steroids ?Continue theophylline ?Added scheduled nebs for shortness of breath ? ?DVT prophylaxis: SQ Lovenox ?Code Status: Full ?Family Communication: 2 family members at bedside 5/6 ?Disposition Plan: Status is: Inpatient ?Remains inpatient appropriate because: Left breast abscess status post I&D.  Remains on IV antibiotics.  Awaiting culture data.  Tentative plan discharge on 5/7 ? ? ?Level of care: Med-Surg ? ?Consultants:  ?General surgery ? ?Procedures:  ?Left breast I&D 5/4 ? ?Antimicrobials: ?Vancomycin ? ? ?Subjective: ?Seen and examined.  Resting comfortably bed.  No visible distress.  No pain complaints. ? ?Objective: ?Vitals:  ? 08/18/21 1959 08/18/21 2115 08/19/21 0537 08/19/21 0819  ?BP: (!) 129/52  (!) 127/53 (!) 163/53  ?Pulse: 75  67 84  ?Resp: 16  20 20   ?Temp: 98.2 ?F (36.8 ?C)  98.2 ?F (36.8 ?C) 99 ?F (37.2 ?C)  ?TempSrc: Oral  Oral Oral  ?SpO2: 100% 100% 100% 98%  ?Weight:      ?Height:      ? ? ?Intake/Output Summary (Last 24 hours) at 08/19/2021 1119 ?Last data filed at 08/19/2021 1035 ?Gross per 24 hour  ?Intake 1050 ml  ?Output 600 ml  ?Net 450 ml  ? ?Filed Weights  ? 08/17/21 1109 08/17/21 1715  ?Weight: 72.6 kg 72.6 kg  ? ? ?  Examination: ? ?General exam: No acute distress ?Respiratory system: Diminished breath sounds bilaterally.  Normal work of breathing.  3 L ?Cardiovascular system: S1-S2, RRR, no murmurs, no pedal edema ?Gastrointestinal system: Soft, NT/ND, normal bowel sounds ?Central nervous system: Alert and oriented. No focal neurological deficits. ?Extremities: Symmetric 5 x 5 power. ?Skin: Left chest surgical incision CDI ?Psychiatry: Judgement and insight appear  normal. Mood & affect appropriate.  ? ? ? ?Data Reviewed: I have personally reviewed following labs and imaging studies ? ?CBC: ?Recent Labs  ?Lab 08/17/21 ?1152 08/18/21 ?0433  ?WBC 8.4 8.1  ?NEUTROABS 5.8  --   ?HGB 12.5 12.2  ?HCT 38.7 38.9  ?MCV 94.4 95.3  ?PLT 261 254  ? ?Basic Metabolic Panel: ?Recent Labs  ?Lab 08/17/21 ?1152 08/18/21 ?0433 08/19/21 ?0352  ?NA 135 135  --   ?K 4.4 4.3  --   ?CL 96* 95*  --   ?CO2 30 32  --   ?GLUCOSE 95 95  --   ?BUN 6 9  --   ?CREATININE 0.65 0.72 0.63  ?CALCIUM 9.4 9.4  --   ? ?GFR: ?Estimated Creatinine Clearance: 69.8 mL/min (by C-G formula based on SCr of 0.63 mg/dL). ?Liver Function Tests: ?Recent Labs  ?Lab 08/17/21 ?1152  ?AST 14*  ?ALT 11  ?ALKPHOS 74  ?BILITOT 0.6  ?PROT 7.3  ?ALBUMIN 3.8  ? ?No results for input(s): LIPASE, AMYLASE in the last 168 hours. ?No results for input(s): AMMONIA in the last 168 hours. ?Coagulation Profile: ?No results for input(s): INR, PROTIME in the last 168 hours. ?Cardiac Enzymes: ?No results for input(s): CKTOTAL, CKMB, CKMBINDEX, TROPONINI in the last 168 hours. ?BNP (last 3 results) ?No results for input(s): PROBNP in the last 8760 hours. ?HbA1C: ?No results for input(s): HGBA1C in the last 72 hours. ?CBG: ?No results for input(s): GLUCAP in the last 168 hours. ?Lipid Profile: ?No results for input(s): CHOL, HDL, LDLCALC, TRIG, CHOLHDL, LDLDIRECT in the last 72 hours. ?Thyroid Function Tests: ?No results for input(s): TSH, T4TOTAL, FREET4, T3FREE, THYROIDAB in the last 72 hours. ?Anemia Panel: ?No results for input(s): VITAMINB12, FOLATE, FERRITIN, TIBC, IRON, RETICCTPCT in the last 72 hours. ?Sepsis Labs: ?Recent Labs  ?Lab 08/17/21 ?1215  ?LATICACIDVEN 1.4  ? ? ?Recent Results (from the past 240 hour(s))  ?Blood culture (routine x 2)     Status: None (Preliminary result)  ? Collection Time: 08/17/21 12:15 PM  ? Specimen: BLOOD  ?Result Value Ref Range Status  ? Specimen Description BLOOD BLOOD RIGHT HAND  Final  ? Special Requests    Final  ?  BOTTLES DRAWN AEROBIC AND ANAEROBIC Blood Culture adequate volume  ? Culture   Final  ?  NO GROWTH 2 DAYS ?Performed at [facility], [address] [place], [place] [address] ?  ? Report Status PENDING  Incomplete  ?Blood culture (routine x 2)     Status: None (Preliminary result)  ? Collection Time: 08/17/21 12:16 PM  ? Specimen: BLOOD  ?Result Value Ref Range Status  ? Specimen Description BLOOD LEFT ANTECUBITAL  Final  ? Special Requests   Final  ?  BOTTLES DRAWN AEROBIC AND ANAEROBIC Blood Culture adequate volume  ? Culture   Final  ?  NO GROWTH 2 DAYS ?Performed at [facility], [address] [place], [place] [address] ?  ? Report Status PENDING  Incomplete  ?Aerobic/Anaerobic Culture w Gram Stain (surgical/deep wound)     Status: None (Preliminary result)  ? Collection Time: 08/17/21  6:49 PM  ? Specimen: PATH Breast other; Tissue  ?Result Value Ref Range Status  ? Specimen Description   Final  ?  TISSUE ?Performed at [facility], [address] [place], [place] [address] ?  ? Special Requests   Final  ?  NONE LEFT BREAST ?Performed at [facility], [address] [place], [place] [address] ?  ? Gram Stain   Final  ?  NO SQUAMOUS EPITHELIAL CELLS SEEN ?FEW WBC SEEN ?ABUNDANT GRAM POSITIVE COCCI ?  ? Culture   Final  ?  NO GROWTH < 12 HOURS ?Performed at [facility], [address], [place], [place] [address] ?  ? Report Status PENDING  Incomplete  ?  ? ? ? ? ? ?Radiology Studies: ?[place] OR NERVE BLOCK-IMAGE ONLY [facility]) ? ?Result Date: 08/17/2021 ?There is no interpretation for this exam.  This order is for images obtained during a surgical procedure.  Please See "Surgeries" Tab for more information regarding the procedure.  ? ?US BREAST LTD UNI LEFT INC AXILLA ? ?Result Date: 08/17/2021 ?CLINICAL DATA:  61 year old female presenting for worsening left breast swelling and redness for approximately 1 week. Patient was placed on antibiotics approximately  1 week ago. EXAM: ULTRASOUND OF THE LEFT BREAST COMPARISON:  None Available. FINDINGS: Targeted ultrasound is performed at the site of concern in the left breast at 3 o'clock retroareolar demonstrating a he

## 2021-08-19 NOTE — Progress Notes (Signed)
Discharge instructions provided to patient. All medications, follow up appointments, and discharge instructions discussed. IV out. Discharging home with family.  ?Lesleyanne Politte R Letita Prentiss, RN  ?

## 2021-08-19 NOTE — Progress Notes (Signed)
Status post I&D of right breast abscess.  She is doing well.  No fevers or chills.  She is ready go home. ?Positive cocci from gram stain ? ?PE NAD ?Breast: Patient just changed.  No infection.  No evidence of necrotizing infection ? ?A/p doing very well. ?I taught her about daily packing and she is very familiar with it. ?Go home with doxycycline for 10 days. ?

## 2021-08-22 ENCOUNTER — Other Ambulatory Visit: Payer: Self-pay | Admitting: Surgery

## 2021-08-22 LAB — AEROBIC/ANAEROBIC CULTURE W GRAM STAIN (SURGICAL/DEEP WOUND): Gram Stain: NONE SEEN

## 2021-08-22 LAB — CULTURE, BLOOD (ROUTINE X 2)
Culture: NO GROWTH
Culture: NO GROWTH
Special Requests: ADEQUATE
Special Requests: ADEQUATE

## 2021-08-22 MED ORDER — SULFAMETHOXAZOLE-TRIMETHOPRIM 800-160 MG PO TABS: 1.0000 | ORAL_TABLET | Freq: Two times a day (BID) | ORAL | 0 refills | Status: AC

## 2021-08-22 NOTE — Progress Notes (Signed)
08/22/21 ? ?Culture date from abscess I&D shows staph epidermidis.  It appears to be resistant to tetracycline.  The patient was discharged on Doxycycline.  I left a voicemail to the patient that I'm sending a new prescription for Batrim DS and to stop the Doxycycline. ? ?Henrene Dodge, MD

## 2021-08-24 ENCOUNTER — Ambulatory Visit (INDEPENDENT_AMBULATORY_CARE_PROVIDER_SITE_OTHER): Payer: 59 | Admitting: Physician Assistant

## 2021-08-24 ENCOUNTER — Encounter: Payer: Self-pay | Admitting: Physician Assistant

## 2021-08-24 ENCOUNTER — Other Ambulatory Visit: Payer: Self-pay

## 2021-08-24 VITALS — BP 179/75 | HR 83 | Temp 98.1°F | Ht 62.5 in | Wt 157.0 lb

## 2021-08-24 DIAGNOSIS — Z09 Encounter for follow-up examination after completed treatment for conditions other than malignant neoplasm: Secondary | ICD-10-CM

## 2021-08-24 DIAGNOSIS — N611 Abscess of the breast and nipple: Secondary | ICD-10-CM

## 2021-08-24 DIAGNOSIS — N61 Mastitis without abscess: Secondary | ICD-10-CM

## 2021-08-24 NOTE — Patient Instructions (Addendum)
Please call the office with any questions or concerns.  ?You may shower. ?Wash the area with saop and water and apply dressing if needed. Continue to take all the Antibiotic.  ? ? ?

## 2021-08-24 NOTE — Progress Notes (Signed)
 [facility] ?POST-OP OFFICE VISIT ? ?08/24/2021 ? ?HPI: ?Kristi Walter is a 61 y.o. female 7 days s/p incision and drainage of left breast abscess with Dr Hampton Abbot ? ?Of note, ?Cx grew staph epidermis (multiple resistances - reviewed); she was discharge home with doxycycline initially but switched to Bactrim on 05/09 after susceptibilities came back.  ? ?This afternoon, she has continued to do well at home. No longer needing any more packing. There is mild erythema around the site but otherwise no issues. No fever, chills. No drainage. She has been taking her bactrim as prescribed.  ? ? ?Vital signs: ?BP (!) 179/75   Pulse 83   Temp 98.1 ?F (36.7 ?C) (Oral)   Ht 5' 2.5" (1.588 m)   Wt 157 lb (71.2 kg)   SpO2 93%   BMI 28.26 kg/m?   ? ?Physical Exam: ?Constitutional: Well appearing female, NAD ?Left Breast: Chaperone present, I&D site to the 3 o'clock position at the areolar edge, this is around 1 cm, there is minimal depth (<5 mm), the visible wound bed is 100% granulation tissue, no drainage, no evidence of residual infection ? ?Assessment/Plan: ?This is a 61 y.o. female 7 days s/p incision and drainage of left breast abscess  ? ? - Complete Bactrim as prescribed ? - At this point, she can transition to superficial dressings daily and as needed; reviewed wound care instructions ? - Pain control prn ? - I am okay with her following up as needed given how well she is doing and small nature of her wound. She understands to call in the interim if things worsen or do not improve.  ? ?-- ?Edison Simon, PA-C ?[facility] ?08/24/2021, 3:00 PM ?M-F: 7am - 4pm ? ?

## 2021-08-25 ENCOUNTER — Inpatient Hospital Stay: Payer: Self-pay | Admitting: Surgery

## 2021-09-19 ENCOUNTER — Encounter: Payer: Self-pay | Admitting: Physician Assistant

## 2021-09-19 ENCOUNTER — Ambulatory Visit (INDEPENDENT_AMBULATORY_CARE_PROVIDER_SITE_OTHER): Payer: Managed Care, Other (non HMO) | Admitting: Physician Assistant

## 2021-09-19 VITALS — BP 158/71 | HR 85 | Temp 97.9°F | Ht 62.5 in | Wt 158.0 lb

## 2021-09-19 DIAGNOSIS — N61 Mastitis without abscess: Secondary | ICD-10-CM

## 2021-09-19 DIAGNOSIS — N611 Abscess of the breast and nipple: Secondary | ICD-10-CM

## 2021-09-19 DIAGNOSIS — Z09 Encounter for follow-up examination after completed treatment for conditions other than malignant neoplasm: Secondary | ICD-10-CM

## 2021-09-19 NOTE — Patient Instructions (Signed)
Continue to monitor the area. May do warm compresses for comfort. No infection noted on exam.  Follow-up with our office as needed.  Please call and ask to speak with a nurse if you develop questions or concerns.

## 2021-09-19 NOTE — Progress Notes (Signed)
[facility] POST-OP OFFICE VISIT  09/19/2021  HPI: Kristi Walter is a 61 y.o. female >1 month s/p incision and drainage of left breast abscess with Dr Aleen Campi  She is overall doing well She reports over the weekend she noticed some increased firmness at the I&D site and a small amount of yellowish-reddish drainage No fever, chills She just wanted checked out because of the previous infection No other new complaints.   Vital signs: BP (!) 158/71   Pulse 85   Temp 97.9 F (36.6 C)   Ht 5' 2.5" (1.588 m)   Wt 158 lb (71.7 kg)   SpO2 100%   BMI 28.44 kg/m    Physical Exam: Constitutional: Well appearing female, NAD Left Breast: Chaperone present, I&D site to the 3 o'clock position at the areolar edge, this has completely healed aside from a very small <1 mm spot, there is a mild amount of induration in the area, no erythema, non-tender, no expressible drainage, and no fluctuance  Assessment/Plan: This is a 61 y.o. female >1 month s/p incision and drainage of left breast abscess   - No evidence of infection. I suspect she may have had a small seroma that spontaneously drained over the weekend. No indication for drainage or further antibiotics.   - She can follow up on as needed basis; She understands to call with questions/concerns  -- Lynden Oxford, PA-C [facility] 09/19/2021, 3:02 PM M-F: 7am - 4pm

## 2021-10-10 ENCOUNTER — Other Ambulatory Visit: Payer: Self-pay | Admitting: Internal Medicine

## 2021-10-10 ENCOUNTER — Other Ambulatory Visit: Payer: Self-pay | Admitting: Nurse Practitioner

## 2021-11-01 ENCOUNTER — Other Ambulatory Visit: Payer: Self-pay

## 2021-11-01 ENCOUNTER — Telehealth: Payer: Self-pay

## 2021-11-01 MED ORDER — BUDESONIDE-FORMOTEROL FUMARATE 160-4.5 MCG/ACT IN AERO: INHALATION_SPRAY | RESPIRATORY_TRACT | 0 refills | Status: DC

## 2021-11-01 MED ORDER — ALBUTEROL SULFATE HFA 108 (90 BASE) MCG/ACT IN AERS: INHALATION_SPRAY | RESPIRATORY_TRACT | 0 refills | Status: DC

## 2021-11-01 NOTE — Telephone Encounter (Signed)
Lmom pt to call us back regarding she left message in voice mail regarding inhaler refills that she left to [place] send pres [facility] local [facility]

## 2021-11-29 ENCOUNTER — Other Ambulatory Visit: Payer: Self-pay | Admitting: Internal Medicine

## 2021-12-19 ENCOUNTER — Ambulatory Visit: Payer: Managed Care, Other (non HMO) | Admitting: Internal Medicine

## 2021-12-21 ENCOUNTER — Ambulatory Visit
Admission: RE | Admit: 2021-12-21 | Discharge: 2021-12-21 | Disposition: A | Discharge status: Home / Self Care | Payer: Managed Care, Other (non HMO) | Attending: Nurse Practitioner | Admitting: Nurse Practitioner

## 2021-12-21 ENCOUNTER — Telehealth: Payer: Self-pay

## 2021-12-21 ENCOUNTER — Ambulatory Visit: Payer: Managed Care, Other (non HMO) | Admitting: Nurse Practitioner

## 2021-12-21 ENCOUNTER — Ambulatory Visit
Admission: RE | Admit: 2021-12-21 | Discharge: 2021-12-21 | Disposition: A | Discharge status: Home / Self Care | Payer: Managed Care, Other (non HMO) | Source: Ambulatory Visit | Attending: Nurse Practitioner | Admitting: Nurse Practitioner

## 2021-12-21 VITALS — BP 147/76 | HR 103 | Temp 98.5°F | Resp 16 | Ht 62.5 in | Wt 153.0 lb

## 2021-12-21 DIAGNOSIS — I700 Atherosclerosis of aorta: Secondary | ICD-10-CM | POA: Insufficient documentation

## 2021-12-21 DIAGNOSIS — J441 Chronic obstructive pulmonary disease with (acute) exacerbation: Secondary | ICD-10-CM

## 2021-12-21 DIAGNOSIS — R0602 Shortness of breath: Secondary | ICD-10-CM | POA: Insufficient documentation

## 2021-12-21 DIAGNOSIS — J189 Pneumonia, unspecified organism: Secondary | ICD-10-CM

## 2021-12-21 DIAGNOSIS — Z87891 Personal history of nicotine dependence: Secondary | ICD-10-CM | POA: Diagnosis not present

## 2021-12-21 MED ORDER — PREDNISONE 10 MG PO TABS: ORAL_TABLET | ORAL | 0 refills | Status: AC

## 2021-12-21 MED ORDER — AMOXICILLIN-POT CLAVULANATE 875-125 MG PO TABS: 1.0000 | ORAL_TABLET | Freq: Two times a day (BID) | ORAL | 0 refills | Status: AC

## 2021-12-21 NOTE — Progress Notes (Signed)
Please let the patient know that her chest xray was negative for pneumonia but she should still complete the prednisone and antibiotic to treat the COPD exacerbation

## 2021-12-21 NOTE — Telephone Encounter (Signed)
-----   Message from Sallyanne Kuster, NP sent at 12/21/2021  4:02 PM EDT ----- Please let the patient know that her chest xray was negative for pneumonia but she should still complete the prednisone and antibiotic to treat the COPD exacerbation

## 2021-12-21 NOTE — Telephone Encounter (Signed)
Spoke with patient regarding chest x ray results. 

## 2021-12-21 NOTE — Progress Notes (Unsigned)
[facility] [address] [place], [place] [address]  Internal MEDICINE  Office Visit Note  Patient Name: Kristi Walter  [id number]  [id number]  Date of Service: 12/21/2021  Chief Complaint  Patient presents with   Follow-up    HPI Kristi Walter presents for a follow-up visit for  Cleda Daub worse today, cough congestion, flare of AR and allergies SOB, wheezing  2LPM cont 02 CAP   Current Medication: Outpatient Encounter Medications as of 12/21/2021  Medication Sig   albuterol (VENTOLIN HFA) 108 (90 Base) MCG/ACT inhaler INHALE 1 TO 2 INHALATIONS INTO THE LUNGS EVERY 4 HRS AS NEEDED FOR WHEEZING OR SHORTNESS OF BREATH   amoxicillin-clavulanate (AUGMENTIN) 875-125 MG tablet Take 1 tablet by mouth 2 (two) times daily for 14 days. Take with food.   budesonide-formoterol (SYMBICORT) 160-4.5 MCG/ACT inhaler INHALE 2 INHALATIONS BY  MOUTH TWICE DAILY   EPINEPHrine 0.3 mg/0.3 mL IJ SOAJ injection Inject 0.3 mg into the muscle as needed for anaphylaxis.   HYDROcodone-acetaminophen (NORCO/VICODIN) 5-325 MG tablet Take 1 tablet by mouth every 4 (four) hours as needed for moderate pain.   ipratropium-albuterol (DUONEB) 0.5-2.5 (3) MG/3ML SOLN USE 1 VIAL VIA NEBULIZER  EVERY 6 HOURS AS NEEDED   montelukast (SINGULAIR) 10 MG tablet TAKE 1 TABLET BY MOUTH  DAILY   omeprazole (PRILOSEC) 20 MG capsule TAKE 1 CAPSULE BY MOUTH  DAILY   OXYGEN Inhale 5 L into the lungs. At night pt uses LINCARE   Polyethyl Glycol-Propyl Glycol (LUBRICANT EYE DROPS) 0.4-0.3 % SOLN Place 1-2 drops into both eyes 3 (three) times daily as needed (dry/irritated eyes).   predniSONE (DELTASONE) 10 MG tablet Take one tab 3 x day for 3 days, then take one tab 2 x a day for 3 days and then take one tab a day for 3 days for copd   SPIRIVA HANDIHALER 18 MCG inhalation capsule INHALE THE CONTENTS OF 1  CAPSULE BY MOUTH VIA  HANDIHALER ONCE DAILY   sulfamethoxazole-trimethoprim (BACTRIM DS) 800-160 MG tablet Take 1 tablet by mouth 2  (two) times daily.   theophylline (UNIPHYL) 400 MG 24 hr tablet TAKE 1 TABLET BY MOUTH  DAILY   No facility-administered encounter medications on file as of 12/21/2021.    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   Procedure: DUPUYTREN CONTRACTURE RELEASE LEFT RING AND LEFT LITTLE FINGER;  Surgeon: Christena Flake, MD;  Location: [facility] ORS;  Service: Orthopedics;  Laterality: Left;   FOOT SURGERY Left    tendon laceration repair   IRRIGATION AND DEBRIDEMENT ABSCESS Left 08/17/2021   Procedure: IRRIGATION AND DEBRIDEMENT LEFT BREAST ABSCESS;  Surgeon: Henrene Dodge, MD;  Location: [facility] ORS;  Service: General;  Laterality: Left;   pyloric     stenosis surgery    Medical History: Past Medical History:  Diagnosis Date   Asthma    COPD (chronic obstructive pulmonary disease) (HCC)    Dyspnea    GERD (gastroesophageal reflux disease)    Orthopnea  Pneumonia    Wheezing     Family History: Family History  Problem Relation Age of Onset   Hyperlipidemia Mother    Hypertension Mother    Stroke Mother     Social History   Socioeconomic History   Marital status: Widowed    Spouse name: Not on file   Number of children: Not on file   Years of education: Not on file   Highest education level: Not on file  Occupational History   Not on file  Tobacco Use   Smoking status: Former    Packs/day: 0.25    Years: 30.00    Total pack years: 7.50    Types: Cigarettes    Quit date: 07/30/2017     Years since quitting: 4.3   Smokeless tobacco: Never  Vaping Use   Vaping Use: Never used  Substance and Sexual Activity   Alcohol use: No   Drug use: No   Sexual activity: Not on file  Other Topics Concern   Not on file  Social History Narrative   Lives with 2 sons   Social Determinants of Health   Financial Resource Strain: Not on file  Food Insecurity: Not on file  Transportation Needs: Not on file  Physical Activity: Not on file  Stress: Not on file  Social Connections: Not on file  Intimate Partner Violence: Not on file      Review of Systems  Vital Signs: BP (!) 147/76   Pulse (!) 103   Temp 98.5 F (36.9 C)   Resp 16   Ht 5' 2.5" (1.588 m)   Wt 153 lb (69.4 kg)   SpO2 98%   BMI 27.54 kg/m    Physical Exam     Assessment/Plan:   General Counseling: Kristi Walter verbalizes understanding of the findings of todays visit and agrees with plan of treatment. I have discussed any further diagnostic evaluation that may be needed or ordered today. We also reviewed her medications today. she has been encouraged to call the office with any questions or concerns that should arise related to todays visit.    Orders Placed This Encounter  Procedures   DG Chest 2 View   Spirometry with graph    Meds ordered this encounter  Medications   amoxicillin-clavulanate (AUGMENTIN) 875-125 MG tablet    Sig: Take 1 tablet by mouth 2 (two) times daily for 14 days. Take with food.    Dispense:  28 tablet    Refill:  0    Please fill ASAP   predniSONE (DELTASONE) 10 MG tablet    Sig: Take one tab 3 x day for 3 days, then take one tab 2 x a day for 3 days and then take one tab a day for 3 days for copd    Dispense:  18 tablet    Refill:  0    Please fill ASAP    Return in about 6 months (around 06/21/2022) for F/U, pulmonary only Broderick Fonseca or DSK.   Total time spent:*** Minutes Time spent includes review of chart, medications, test results, and follow up plan with the  patient.   [place] Controlled Substance Database was reviewed by me.  This patient was seen by Sallyanne Kuster, FNP-C in collaboration with Dr. Beverely Risen as a part of collaborative care agreement.   Sueko Dimichele R. Tedd Sias, MSN, FNP-C Internal medicine

## 2021-12-23 ENCOUNTER — Encounter: Payer: Self-pay | Admitting: Nurse Practitioner

## 2022-02-13 ENCOUNTER — Other Ambulatory Visit: Payer: Self-pay | Admitting: Internal Medicine

## 2022-04-10 ENCOUNTER — Other Ambulatory Visit: Payer: Self-pay | Admitting: Internal Medicine

## 2022-04-11 ENCOUNTER — Other Ambulatory Visit: Payer: Self-pay | Admitting: Internal Medicine

## 2022-04-11 DIAGNOSIS — J449 Chronic obstructive pulmonary disease, unspecified: Secondary | ICD-10-CM

## 2022-05-29 ENCOUNTER — Other Ambulatory Visit: Payer: Self-pay

## 2022-05-29 DIAGNOSIS — R0602 Shortness of breath: Secondary | ICD-10-CM

## 2022-05-30 ENCOUNTER — Telehealth: Payer: Self-pay | Admitting: Internal Medicine

## 2022-05-30 NOTE — Telephone Encounter (Signed)
Left vm and sent mychart message to confirm 06/06/22 appointment-Toni

## 2022-06-06 ENCOUNTER — Ambulatory Visit: Payer: Managed Care, Other (non HMO) | Admitting: Internal Medicine

## 2022-06-17 ENCOUNTER — Other Ambulatory Visit: Payer: Self-pay | Admitting: Nurse Practitioner

## 2022-06-21 ENCOUNTER — Ambulatory Visit: Payer: Managed Care, Other (non HMO) | Admitting: Nurse Practitioner

## 2022-07-15 ENCOUNTER — Other Ambulatory Visit: Payer: Self-pay | Admitting: Nurse Practitioner

## 2022-07-15 DIAGNOSIS — J449 Chronic obstructive pulmonary disease, unspecified: Secondary | ICD-10-CM

## 2022-07-16 ENCOUNTER — Telehealth: Payer: Self-pay | Admitting: Internal Medicine

## 2022-07-16 NOTE — Telephone Encounter (Signed)
Pt need appt for refills  ?

## 2022-07-16 NOTE — Telephone Encounter (Signed)
Patient moved to [place]. Patient d/c-Toni

## 2022-07-17 ENCOUNTER — Telehealth: Payer: Self-pay

## 2022-07-17 NOTE — Telephone Encounter (Signed)
Spoke with [employer] from [employer] and canceled pt Theophylline pres due pt move and she no longer pt for DR [employer]

## 2022-08-24 ENCOUNTER — Other Ambulatory Visit: Payer: Self-pay | Admitting: Nurse Practitioner

## 2022-09-10 ENCOUNTER — Other Ambulatory Visit: Payer: Self-pay | Admitting: Internal Medicine

## 2022-09-28 IMAGING — US US BREAST*L* LIMITED INC AXILLA
1 series · 13 of 22 positions shown · non-contrast
Comparison: None Available.

CLINICAL DATA: 61-year-old female presenting for worsening left
breast swelling and redness for approximately 1 week. Patient was
placed on antibiotics approximately 1 week ago.

EXAM:
ULTRASOUND OF THE LEFT BREAST

[Series 1: [facility] breast [facility] uni left inc axilla · 22 acquisitions, 13 frames shown]
[im 1/22]
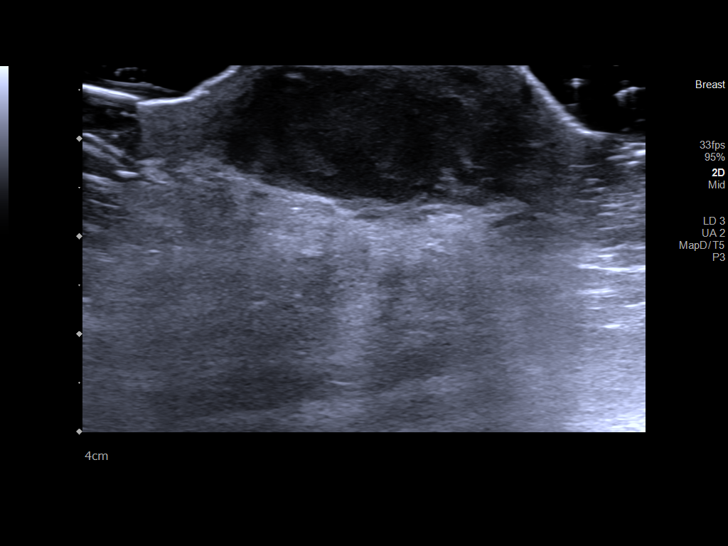
[im 3/22]
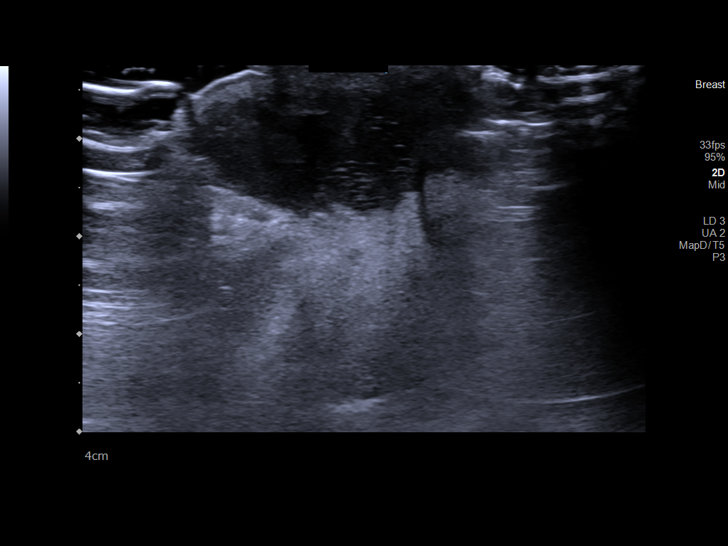
[im 5/22]
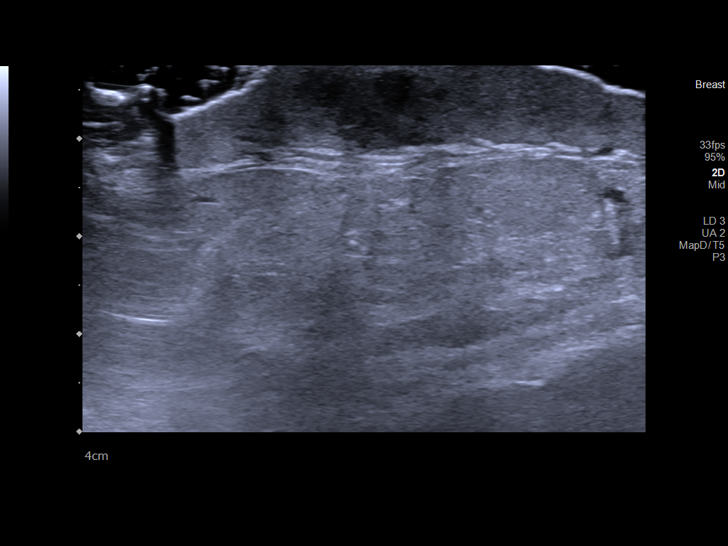
[im 6/22]
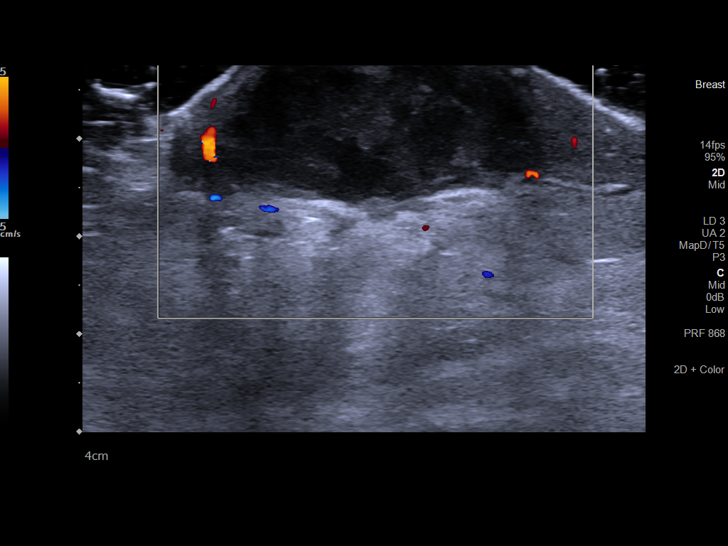
[im 8/22]
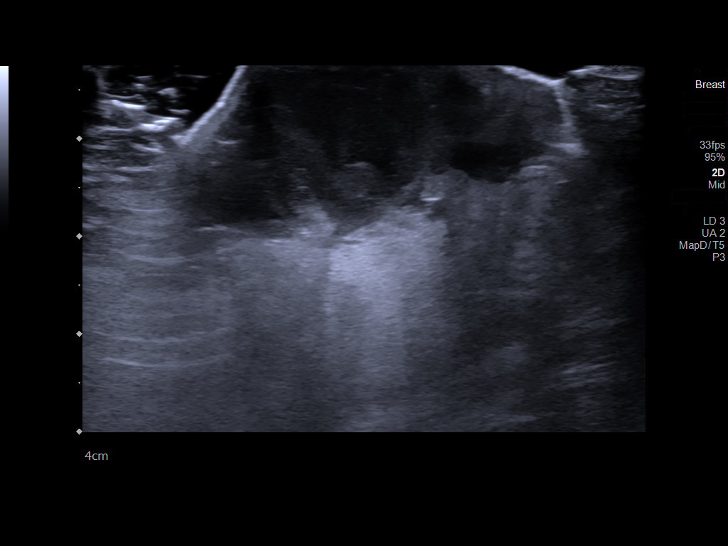
[im 10/22]
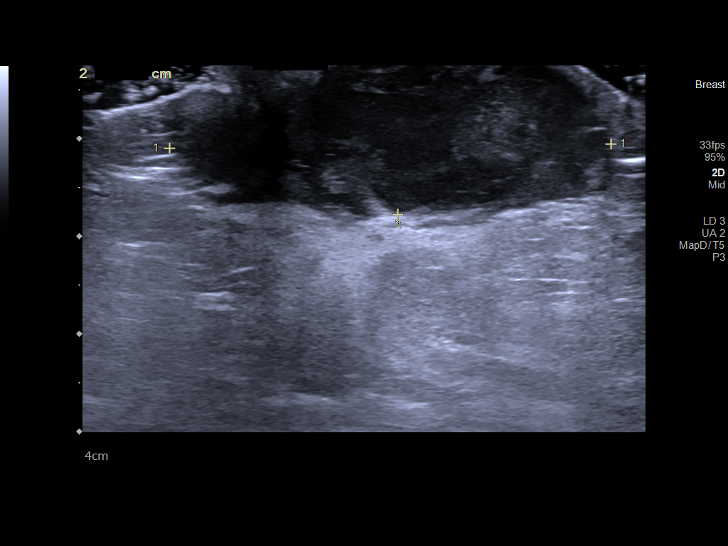
[im 12/22]
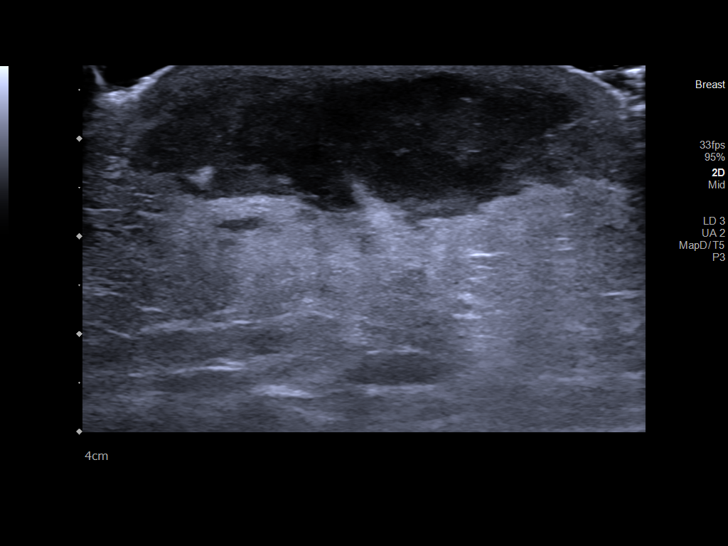
[im 13/22]
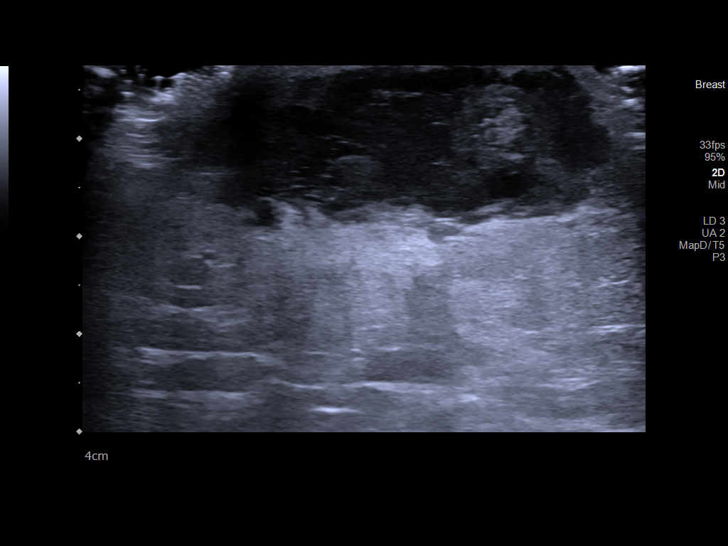
[im 15/22]
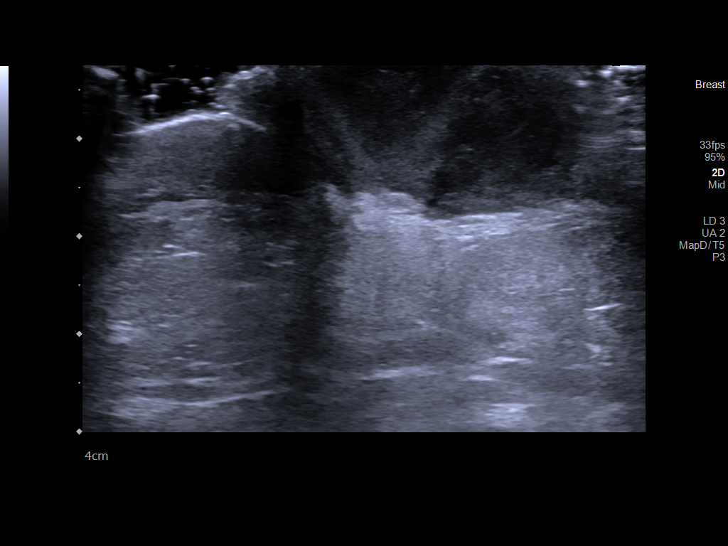
[im 17/22]
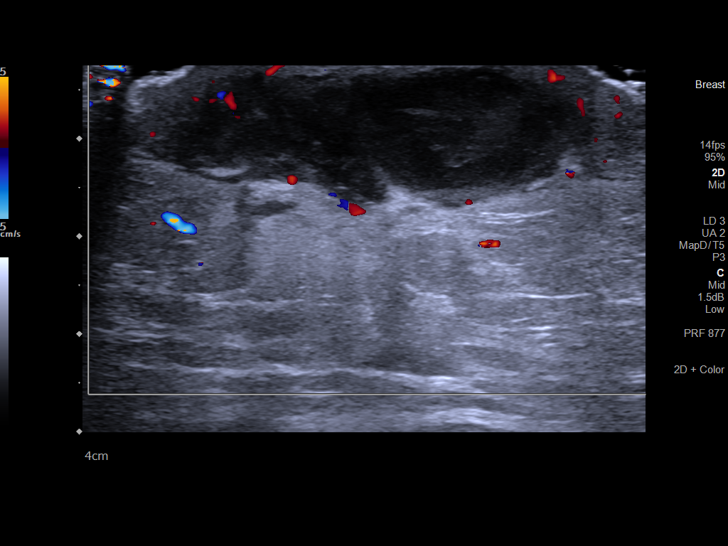
[im 18/22]
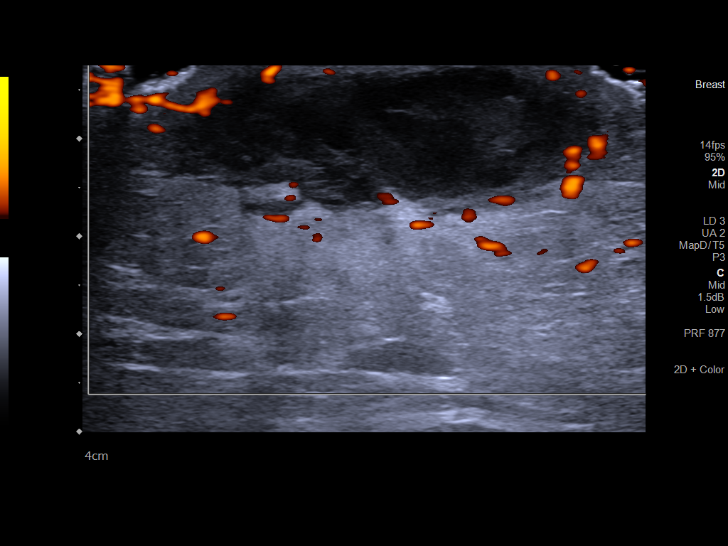
[im 20/22]
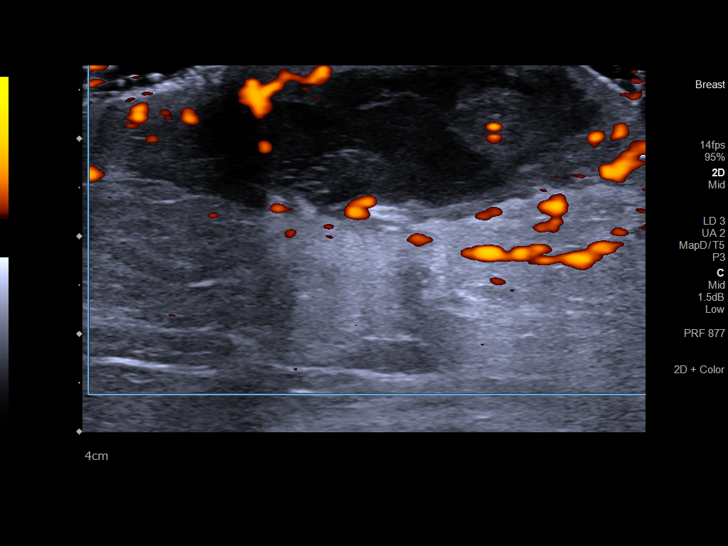
[im 22/22]
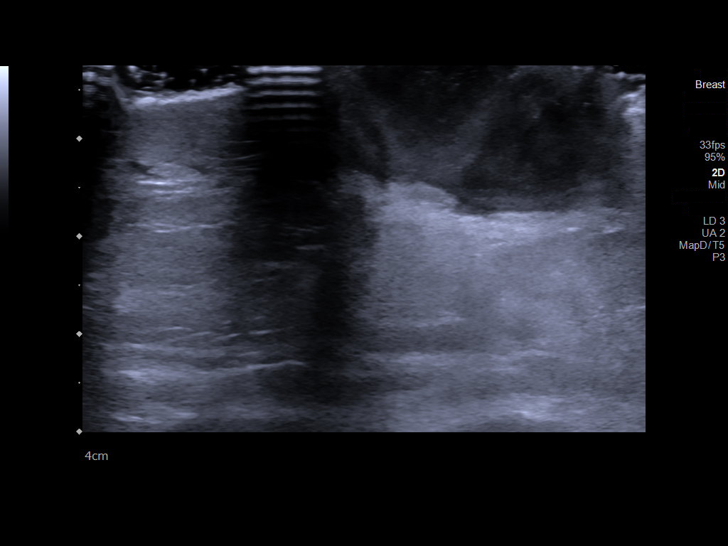

[13 of 22 positions shown; findings below may reference images not displayed]

FINDINGS: Targeted ultrasound is performed at the site of concern in the left
breast at 3 o'clock retroareolar demonstrating a heterogeneous
mass/collection to the nipple that is within the periareolar skin.
The collection measures 4.5 x 1.6 x 3.6 cm. There is increased
vascularity in the surrounding tissues likely representing
inflammation. There is no mass or collection extending into the
deeper sub adjacent breast tissue.
IMPRESSION: Superficial heterogeneous mass/collection in the retroareolar left
breast measuring up to 4.5 cm. Given clinical history this is
favored to represent an abscess.

RECOMMENDATION:
1. Consider incision and drainage of the superficial left breast
collection.

2.  Defer antibiotic therapy to clinical team.

3. Follow-up exam in a dedicated [REDACTED] in 1-2 weeks. Patient
also needs a bilateral diagnostic mammogram.

I have discussed the findings and recommendations with the patient.
If applicable, a reminder letter will be sent to the patient
regarding the next appointment.

BI-RADS CATEGORY  3: Probably benign.

## 2022-11-12 ENCOUNTER — Other Ambulatory Visit: Payer: Self-pay | Admitting: Internal Medicine

## 2022-11-17 ENCOUNTER — Other Ambulatory Visit: Payer: Self-pay | Admitting: Internal Medicine

## 2022-11-27 ENCOUNTER — Other Ambulatory Visit: Payer: Self-pay | Admitting: Internal Medicine

## 2022-12-24 ENCOUNTER — Other Ambulatory Visit: Payer: Self-pay | Admitting: Internal Medicine

## 2023-02-28 ENCOUNTER — Other Ambulatory Visit: Payer: Self-pay | Admitting: Internal Medicine

## 2023-02-28 ENCOUNTER — Other Ambulatory Visit: Payer: Self-pay | Admitting: Nurse Practitioner

## 2023-04-16 ENCOUNTER — Other Ambulatory Visit: Payer: Self-pay | Admitting: Internal Medicine

## 2023-07-18 ENCOUNTER — Telehealth: Payer: Self-pay | Admitting: Nurse Practitioner

## 2023-07-18 NOTE — Telephone Encounter (Signed)
 Patient wanted a order for oxygen.
